# Patient Record
Sex: Female | Born: 1940
Health system: Southern US, Community
[De-identification: ages and names within clinical notes are randomized; demographics above are authoritative.]

## PROBLEM LIST (undated history)

## (undated) DIAGNOSIS — Z1621 Resistance to vancomycin: Secondary | ICD-10-CM

## (undated) DIAGNOSIS — F329 Major depressive disorder, single episode, unspecified: Secondary | ICD-10-CM

## (undated) DIAGNOSIS — Z933 Colostomy status: Secondary | ICD-10-CM

## (undated) DIAGNOSIS — K5720 Diverticulitis of large intestine with perforation and abscess without bleeding: Secondary | ICD-10-CM

## (undated) DIAGNOSIS — I10 Essential (primary) hypertension: Secondary | ICD-10-CM

## (undated) DIAGNOSIS — A491 Streptococcal infection, unspecified site: Secondary | ICD-10-CM

## (undated) HISTORY — DX: Major depressive disorder, single episode, unspecified: F32.9

## (undated) HISTORY — DX: Essential (primary) hypertension: I10

## (undated) HISTORY — DX: Streptococcal infection, unspecified site: A49.1

## (undated) HISTORY — DX: Diverticulitis of large intestine with perforation and abscess without bleeding: K57.20

## (undated) HISTORY — DX: Resistance to vancomycin: Z16.21

## (undated) HISTORY — DX: Colostomy status: Z93.3

---

## 1987-10-26 HISTORY — PX: ABDOMINAL HYSTERECTOMY: SUR658

## 1987-10-26 HISTORY — PX: TOTAL ABDOMINAL HYSTERECTOMY: SHX209

## 2004-12-22 ENCOUNTER — Ambulatory Visit: Payer: Self-pay

## 2005-12-28 ENCOUNTER — Ambulatory Visit: Payer: Self-pay

## 2007-01-03 ENCOUNTER — Ambulatory Visit: Payer: Self-pay

## 2007-07-10 ENCOUNTER — Ambulatory Visit: Payer: Self-pay | Admitting: Gastroenterology

## 2007-07-10 LAB — HM COLONOSCOPY

## 2007-10-09 ENCOUNTER — Ambulatory Visit: Payer: Self-pay | Admitting: Urology

## 2008-01-10 ENCOUNTER — Ambulatory Visit: Payer: Self-pay

## 2008-10-30 DIAGNOSIS — N281 Cyst of kidney, acquired: Secondary | ICD-10-CM | POA: Insufficient documentation

## 2008-10-30 DIAGNOSIS — M810 Age-related osteoporosis without current pathological fracture: Secondary | ICD-10-CM | POA: Insufficient documentation

## 2008-10-31 DIAGNOSIS — F411 Generalized anxiety disorder: Secondary | ICD-10-CM | POA: Insufficient documentation

## 2009-01-10 ENCOUNTER — Ambulatory Visit: Payer: Self-pay

## 2009-08-07 LAB — HM DEXA SCAN

## 2010-01-13 ENCOUNTER — Ambulatory Visit: Payer: Self-pay

## 2011-01-18 ENCOUNTER — Ambulatory Visit: Payer: Self-pay

## 2011-04-13 ENCOUNTER — Emergency Department: Payer: Self-pay | Admitting: Emergency Medicine

## 2012-02-02 ENCOUNTER — Ambulatory Visit: Payer: Self-pay

## 2012-08-01 LAB — LIPID PANEL
Cholesterol: 210 mg/dL — AB (ref 0–200)
HDL: 82 mg/dL — AB (ref 35–70)
LDL Cholesterol: 113 mg/dL
Triglycerides: 75 mg/dL (ref 40–160)

## 2012-08-17 ENCOUNTER — Ambulatory Visit: Payer: Self-pay | Admitting: Family Medicine

## 2012-08-25 HISTORY — PX: CATARACT EXTRACTION: SUR2

## 2013-02-21 ENCOUNTER — Ambulatory Visit: Payer: Self-pay

## 2013-09-11 LAB — HEPATIC FUNCTION PANEL
ALT: 10 U/L (ref 7–35)
AST: 16 U/L (ref 13–35)

## 2013-09-11 LAB — TSH: TSH: 2.11 u[IU]/mL (ref <=5.90)

## 2013-09-11 LAB — BASIC METABOLIC PANEL
BUN: 22 mg/dL — AB (ref 4–21)
GLUCOSE: 91 mg/dL
Potassium: 4.6 mmol/L (ref 3.4–5.3)
Sodium: 144 mmol/L (ref 137–147)

## 2013-09-11 LAB — CBC AND DIFFERENTIAL
HCT: 35 % — AB (ref 36–46)
Hemoglobin: 11.6 g/dL — AB (ref 12.0–16.0)
Platelets: 292 10*3/uL (ref 150–399)
WBC: 4.3 10^3/mL

## 2014-12-25 DIAGNOSIS — Z961 Presence of intraocular lens: Secondary | ICD-10-CM | POA: Diagnosis not present

## 2015-02-13 DIAGNOSIS — F329 Major depressive disorder, single episode, unspecified: Secondary | ICD-10-CM | POA: Insufficient documentation

## 2015-02-13 DIAGNOSIS — F32A Depression, unspecified: Secondary | ICD-10-CM | POA: Insufficient documentation

## 2015-02-13 DIAGNOSIS — E559 Vitamin D deficiency, unspecified: Secondary | ICD-10-CM | POA: Insufficient documentation

## 2015-02-13 DIAGNOSIS — M9979 Connective tissue and disc stenosis of intervertebral foramina of abdomen and other regions: Secondary | ICD-10-CM | POA: Insufficient documentation

## 2015-02-13 DIAGNOSIS — N951 Menopausal and female climacteric states: Secondary | ICD-10-CM | POA: Insufficient documentation

## 2015-02-13 DIAGNOSIS — M1990 Unspecified osteoarthritis, unspecified site: Secondary | ICD-10-CM | POA: Insufficient documentation

## 2015-02-13 DIAGNOSIS — E7800 Pure hypercholesterolemia, unspecified: Secondary | ICD-10-CM | POA: Insufficient documentation

## 2015-02-13 HISTORY — DX: Depression, unspecified: F32.A

## 2015-02-25 DIAGNOSIS — Z01419 Encounter for gynecological examination (general) (routine) without abnormal findings: Secondary | ICD-10-CM | POA: Diagnosis not present

## 2015-02-25 DIAGNOSIS — Z1231 Encounter for screening mammogram for malignant neoplasm of breast: Secondary | ICD-10-CM | POA: Diagnosis not present

## 2015-04-01 DIAGNOSIS — M9901 Segmental and somatic dysfunction of cervical region: Secondary | ICD-10-CM | POA: Diagnosis not present

## 2015-04-01 DIAGNOSIS — M5033 Other cervical disc degeneration, cervicothoracic region: Secondary | ICD-10-CM | POA: Diagnosis not present

## 2015-04-01 DIAGNOSIS — M5416 Radiculopathy, lumbar region: Secondary | ICD-10-CM | POA: Diagnosis not present

## 2015-04-01 DIAGNOSIS — M9903 Segmental and somatic dysfunction of lumbar region: Secondary | ICD-10-CM | POA: Diagnosis not present

## 2015-04-02 ENCOUNTER — Encounter: Payer: Self-pay | Admitting: Family Medicine

## 2015-04-02 ENCOUNTER — Ambulatory Visit (INDEPENDENT_AMBULATORY_CARE_PROVIDER_SITE_OTHER): Payer: Medicare PPO | Admitting: Family Medicine

## 2015-04-02 VITALS — BP 106/60 | HR 80 | Temp 97.2°F | Resp 16 | Ht 64.0 in | Wt 138.0 lb

## 2015-04-02 DIAGNOSIS — F329 Major depressive disorder, single episode, unspecified: Secondary | ICD-10-CM

## 2015-04-02 DIAGNOSIS — Z0000 Encounter for general adult medical examination without abnormal findings: Secondary | ICD-10-CM | POA: Diagnosis not present

## 2015-04-02 DIAGNOSIS — R413 Other amnesia: Secondary | ICD-10-CM | POA: Diagnosis not present

## 2015-04-02 DIAGNOSIS — M25561 Pain in right knee: Secondary | ICD-10-CM

## 2015-04-02 DIAGNOSIS — F32A Depression, unspecified: Secondary | ICD-10-CM

## 2015-04-02 MED ORDER — MELOXICAM 15 MG PO TABS: 15.0000 mg | ORAL_TABLET | Freq: Every day | ORAL | Status: DC

## 2015-04-02 MED ORDER — ESCITALOPRAM OXALATE 10 MG PO TABS: 10.0000 mg | ORAL_TABLET | Freq: Every day | ORAL | Status: DC

## 2015-04-02 NOTE — Progress Notes (Signed)
Patient ID: Katherine Wood, female   DOB: 07/31/1941, 74 y.o.   MRN: [id number] Patient: Katherine Wood, Female    DOB: 12/10/1940, 74 y.o.   MRN: [id number] Visit Date: 04/02/2015  Today's Provider: Lorie Phenix, MD   Chief Complaint  Patient presents with  . Medicare Wellness   Subjective:    Annual wellness visit Katherine Wood is a 74 y.o. female who presents today for her Subsequent Annual Wellness Visit. She feels well, pt is cc of right knee pain. She reports exercising daily (walks her dog). She reports she is sleeping fairly well. Patient reports that she sees Dr. Luella Cook at [facility], last cpe with him was on 02/2015. Patient reports that mammogram, pelvic and rectal exam were normal.   Past Medical History  Diagnosis Date  . Allergy   . Depression   . Hyperlipidemia   . Anxiety   . Osteoporosis    Past Surgical History  Procedure Laterality Date  . Cataract extraction Bilateral 08/2012    [facility]- Dr. Jettie Pagan  . Abdominal hysterectomy  1989    reports that she has never smoked. She has never used smokeless tobacco. She reports that she does not drink alcohol or use illicit drugs. family history includes Coronary artery disease in her brother; Heart attack in her brother; Heart disease in her father; Hypertension in her mother; Parkinsonism in her mother. No Known Allergies     Review of Systems  Constitutional: Negative.   HENT: Negative.   Eyes: Negative.   Respiratory: Positive for cough.   Gastrointestinal: Negative.   Endocrine: Negative.   Genitourinary: Negative.   Musculoskeletal: Positive for back pain.  Skin: Negative.   Allergic/Immunologic: Negative.   Neurological: Negative.   Hematological: Negative.   Psychiatric/Behavioral: Negative.      Her family history includes Coronary artery disease in her brother; Heart attack in her brother; Heart disease in her father; Hypertension in her mother; Parkinsonism in her  mother.      Patient Care Team: Lorie Phenix, MD as PCP - General (Family Medicine)     Objective:   Vitals: BP 106/60 mmHg  Pulse 80  Temp(Src) 97.2 F (36.2 C) (Oral)  Resp 16  Ht  (1.626 m)  Wt 138 lb (62.596 kg)  BMI 23.68 kg/m2  Physical Exam  Constitutional: She is oriented to person, place, and time. She appears well-developed and well-nourished.  HENT:  Head: Normocephalic and atraumatic.  Right Ear: Tympanic membrane, external ear and ear canal normal.  Left Ear: Tympanic membrane, external ear and ear canal normal.  Nose: Nose normal.  Mouth/Throat: Uvula is midline, oropharynx is clear and moist and mucous membranes are normal.  Eyes: Conjunctivae, EOM and lids are normal. Pupils are equal, round, and reactive to light.  Neck: Trachea normal and normal range of motion. Neck supple. Carotid bruit is not present. No thyroid mass and no thyromegaly present.  Cardiovascular: Normal rate, regular rhythm and normal heart sounds.   Pulmonary/Chest: Effort normal and breath sounds normal.  Abdominal: Soft. Normal appearance and bowel sounds are normal. There is no hepatosplenomegaly. There is no tenderness.  Musculoskeletal: Normal range of motion.  Lymphadenopathy:    She has no cervical adenopathy.    She has no axillary adenopathy.  Neurological: She is alert and oriented to person, place, and time. She has normal strength. No cranial nerve deficit.  Skin: Skin is warm, dry and intact.  Psychiatric: She has a normal mood and  affect. Her speech is normal and behavior is normal. Judgment and thought content normal. Cognition and memory are normal.    Activities of Daily Living In your present state of health, do you have any difficulty performing the following activities: 04/02/2015  Hearing? N  Vision? N  Difficulty concentrating or making decisions? N  Walking or climbing stairs? N  Dressing or bathing? N  Doing errands, shopping? N    Fall Risk  Assessment Fall Risk  04/02/2015  Falls in the past year? No     Depression Screen PHQ 2/9 Scores 04/02/2015  PHQ - 2 Score 0    Cognitive Testing - 6-CIT    Year: 0 4 points  Month: 0 3 points  Memorize "Floyde ParkinsJohn, [place], [address], [address], [place]"  Time (within 1 hour:) 0 3 points  Count backwards from 20: 0 2 4 points  Name months of year: 0 2 4 points  Repeat Address: 0 2 4 6 8 10  points   Total Score: 8/28  Interpretation : Normal (0-7) Abnormal (8-28)    Assessment & Plan:    1. Clinical depression    Worsening. Patient started on Lexapro 10 mg as below. F/U in 6 weeks. - escitalopram (LEXAPRO) 10 MG tablet; Take 1 tablet (10 mg total) by mouth daily. 1/2 tablet for 1 week then increase to 1 tablet daily  Dispense: 30 tablet; Refill: 1  2. Memory loss   Worsening. F/U in 6 weeks. - TSH - Vitamin B12  3. Right knee pain   Worsening. Patient restarted on Mobic 15 mg as below. Patient advised to call if symptoms are worsening or not improving. - meloxicam (MOBIC) 15 MG tablet; Take 1 tablet (15 mg total) by mouth daily.  Dispense: 30 tablet; Refill: 5  4. Medicare annual wellness visit, subsequent   Stable. Patient advised to continue healthy lifestyle.    Annual Wellness Visit  Reviewed patient's Family Medical History Reviewed and updated list of patient's medical providers Assessment of cognitive impairment was done Assessed patient's functional ability Established a written schedule for health screening services Health Risk Assessment Completed and Reviewed  Exercise Activities and Dietary recommendations Goals    . Exercise 150 minutes per week (moderate activity)    . Have 3 meals a day       Immunization History  Administered Date(s) Administered  . Pneumococcal Polysaccharide-23 07/27/2012  . Td 10/31/2008  . Tdap 09/06/2012  . Zoster 09/06/2012    Health Maintenance  Topic Date Due  . MAMMOGRAM  05/14/1992  . PNA vac Low Risk Adult (2 of 2 -  PCV13) 07/27/2013  . INFLUENZA VACCINE  05/26/2015  . COLONOSCOPY  07/09/2017  . TETANUS/TDAP  09/06/2022  . DEXA SCAN  Completed  . ZOSTAVAX  Completed      Discussed health benefits of physical activity, and encouraged her to engage in regular exercise appropriate for her age and condition.    ------------------------------------------------------------------------------------------------------------  Patient seen and examined by Dr. Leo GrosserNancy J.. Tazia Illescas, and note scribed by Liz BeachSulibeya S. Dimas, CMA.  I have reviewed the document for accuracy and completeness and I agree with above. Leo Grosser- Marita Burnsed J. Bridgette Wolden, MD

## 2015-04-03 ENCOUNTER — Telehealth: Payer: Self-pay

## 2015-04-03 LAB — VITAMIN B12: Vitamin B-12: 555 pg/mL (ref 211–946)

## 2015-04-03 LAB — TSH: TSH: 1.85 u[IU]/mL (ref 0.450–4.500)

## 2015-04-03 NOTE — Telephone Encounter (Signed)
LMTCB 04/03/2015  Thanks,   -Vernona Rieger

## 2015-04-03 NOTE — Telephone Encounter (Signed)
Pt advised as directed below.   Thanks,   -Keshan Reha  

## 2015-04-03 NOTE — Telephone Encounter (Signed)
-----   Message from Lorie Phenix, MD sent at 04/03/2015  6:10 AM EDT ----- Labs stable. Please notify patient. Thanks.

## 2015-04-29 ENCOUNTER — Other Ambulatory Visit: Payer: Self-pay | Admitting: Family Medicine

## 2015-04-29 DIAGNOSIS — F32A Depression, unspecified: Secondary | ICD-10-CM

## 2015-04-29 DIAGNOSIS — F329 Major depressive disorder, single episode, unspecified: Secondary | ICD-10-CM

## 2015-04-29 DIAGNOSIS — M5416 Radiculopathy, lumbar region: Secondary | ICD-10-CM | POA: Diagnosis not present

## 2015-04-29 DIAGNOSIS — M9903 Segmental and somatic dysfunction of lumbar region: Secondary | ICD-10-CM | POA: Diagnosis not present

## 2015-04-29 DIAGNOSIS — M9901 Segmental and somatic dysfunction of cervical region: Secondary | ICD-10-CM | POA: Diagnosis not present

## 2015-04-29 DIAGNOSIS — M5033 Other cervical disc degeneration, cervicothoracic region: Secondary | ICD-10-CM | POA: Diagnosis not present

## 2015-04-29 MED ORDER — ESCITALOPRAM OXALATE 10 MG PO TABS: 10.0000 mg | ORAL_TABLET | Freq: Every day | ORAL | Status: DC

## 2015-04-29 NOTE — Telephone Encounter (Signed)
escitalopram (LEXAPRO) 10 MG tablet Refill [facility] [address]

## 2015-05-13 DIAGNOSIS — M9901 Segmental and somatic dysfunction of cervical region: Secondary | ICD-10-CM | POA: Diagnosis not present

## 2015-05-13 DIAGNOSIS — M5033 Other cervical disc degeneration, cervicothoracic region: Secondary | ICD-10-CM | POA: Diagnosis not present

## 2015-05-13 DIAGNOSIS — M5416 Radiculopathy, lumbar region: Secondary | ICD-10-CM | POA: Diagnosis not present

## 2015-05-13 DIAGNOSIS — M9903 Segmental and somatic dysfunction of lumbar region: Secondary | ICD-10-CM | POA: Diagnosis not present

## 2015-05-16 ENCOUNTER — Ambulatory Visit (INDEPENDENT_AMBULATORY_CARE_PROVIDER_SITE_OTHER): Payer: Medicare PPO | Admitting: Family Medicine

## 2015-05-16 ENCOUNTER — Encounter: Payer: Self-pay | Admitting: Family Medicine

## 2015-05-16 VITALS — BP 122/70 | HR 72 | Temp 98.6°F | Resp 16 | Wt 136.6 lb

## 2015-05-16 DIAGNOSIS — F411 Generalized anxiety disorder: Secondary | ICD-10-CM

## 2015-05-16 DIAGNOSIS — R413 Other amnesia: Secondary | ICD-10-CM

## 2015-05-16 NOTE — Progress Notes (Signed)
Patient ID: Katherine Wood, female   DOB: [birth year], 74 y.o.   MRN: [id number]     Subjective:  HPI  Anxiety:  She was seen for this 6 weeks ago. Pt reports that she is doing well on Lexapro. She is not having any side effects that she knows of. She reports that she is about 95% better. She reports that the Lexapro has helped a little with her memory. She not worrying as much and is writing stuff down to try to remember it instead of getting frustrated with herself.  Has not helped as much with short term memory.  Husband is getting very frustrated with her.     Prior to Admission medications   Medication Sig Start Date End Date Taking? Authorizing Provider  calcium carbonate (OS-CAL) 600 MG TABS tablet Take 1 tablet by mouth daily. 08/31/12  Yes Historical Provider, MD  Cholecalciferol (VITAMIN D) 2000 UNITS CAPS Take 1 tablet by mouth daily.   Yes Historical Provider, MD  escitalopram (LEXAPRO) 10 MG tablet Take 1 tablet (10 mg total) by mouth daily. 04/29/15  Yes Lorie Phenix, MD  loratadine (CLARITIN) 10 MG tablet Take 1 tablet by mouth daily.   Yes Historical Provider, MD  meloxicam (MOBIC) 15 MG tablet Take 1 tablet (15 mg total) by mouth daily. 04/02/15  Yes Lorie Phenix, MD    Patient Active Problem List   Diagnosis Date Noted  . Memory loss 05/16/2015  . Arthritis 02/13/2015  . Narrowing of intervertebral disc space 02/13/2015  . Clinical depression 02/13/2015  . Hypercholesteremia 02/13/2015  . Amnesia 02/13/2015  . Symptomatic menopausal or female climacteric states 02/13/2015  . Arthritis, degenerative 02/13/2015  . Avitaminosis D 02/13/2015  . Bone/cartilage disorder 08/15/2009  . Anxiety state 10/31/2008  . Arthropathia 10/30/2008  . Leg varices 10/30/2008  . Acquired cyst of kidney 10/30/2008  . Cannot sleep 10/30/2008  . OP (osteoporosis) 10/30/2008  . Cystocele, midline 10/30/2008    Past Medical History  Diagnosis Date  . Allergy   . Depression   .  Hyperlipidemia   . Anxiety   . Osteoporosis     History   Social History  . Marital Status: Married    Spouse Name: N/A  . Number of Children: N/A  . Years of Education: N/A   Occupational History  . Not on file.   Social History Main Topics  . Smoking status: Never Smoker   . Smokeless tobacco: Never Used  . Alcohol Use: No  . Drug Use: No  . Sexual Activity: Not on file   Other Topics Concern  . Not on file   Social History Narrative    No Known Allergies  Review of Systems  Constitutional: Negative.   Eyes: Negative.   Respiratory: Negative.   Cardiovascular: Negative.   Gastrointestinal: Negative.   Genitourinary: Negative.   Musculoskeletal: Positive for joint pain.  Skin: Negative.   Neurological: Headaches: presently, but thinks it is from allergies.  Endo/Heme/Allergies: Negative.   Psychiatric/Behavioral: Negative.     Immunization History  Administered Date(s) Administered  . Pneumococcal Polysaccharide-23 07/27/2012  . Td 10/31/2008  . Tdap 09/06/2012  . Zoster 09/06/2012   Objective:  BP 122/70 mmHg  Pulse 72  Temp(Src) 98.6 F (37 C) (Oral)  Resp 16  Wt 136 lb 9.6 oz (61.961 kg)   Cognitive Testing - 6-CIT  Correct? Score   What year is it? yes 0 0 or 4  What month is it? yes 0 0 or 3  Memorize:    Floyde Parkins,  [address],  [address],  [place],      What time is it? (within 1 hour) yes 0 0 or 3  Count backwards from 20 yes 0 0, 2, or 4  Name the months of the year no 3 0, 2, or 4  Repeat name & address above no 9 0, 2, 4, 6, 8, or 10       TOTAL SCORE  12/28   Interpretation:  Abnormal  Normal (0-7) Abnormal (8-28)      Physical Exam  Constitutional: She is oriented to person, place, and time and well-developed, well-nourished, and in no distress.  Cardiovascular: Normal rate, regular rhythm, normal heart sounds and intact distal pulses.   Pulmonary/Chest: Effort normal and breath sounds normal.  Neurological: She is alert and  oriented to person, place, and time. She has normal reflexes. Gait normal. GCS score is 15.  Psychiatric: Mood, memory, affect and judgment normal.    Lab Results  Component Value Date   WBC 4.3 09/11/2013   HGB 11.6* 09/11/2013   HCT 35* 09/11/2013   PLT 292 09/11/2013   CHOL 210* 08/01/2012   TRIG 75 08/01/2012   HDL 82* 08/01/2012   LDLCALC 113 08/01/2012   TSH 1.850 04/02/2015    CMP     Component Value Date/Time   NA 144 09/11/2013   K 4.6 09/11/2013   BUN 22* 09/11/2013   AST 16 09/11/2013   ALT 10 09/11/2013    Assessment and Plan :  1. Anxiety state Improved on medication. Keep medication the same.  Please call back if condition worsens or does not continue to improve.     2. Memory loss Unchanged. Concerning for dementia. Will refer to neurology.  - Ambulatory referral to Neurology   Lorie Phenix, MD  [facility] 05/16/2015 2:25 PM

## 2015-06-17 DIAGNOSIS — M5033 Other cervical disc degeneration, cervicothoracic region: Secondary | ICD-10-CM | POA: Diagnosis not present

## 2015-06-17 DIAGNOSIS — M9901 Segmental and somatic dysfunction of cervical region: Secondary | ICD-10-CM | POA: Diagnosis not present

## 2015-06-17 DIAGNOSIS — M5416 Radiculopathy, lumbar region: Secondary | ICD-10-CM | POA: Diagnosis not present

## 2015-06-17 DIAGNOSIS — M9903 Segmental and somatic dysfunction of lumbar region: Secondary | ICD-10-CM | POA: Diagnosis not present

## 2015-07-02 DIAGNOSIS — G301 Alzheimer's disease with late onset: Secondary | ICD-10-CM | POA: Diagnosis not present

## 2015-07-02 DIAGNOSIS — F0280 Dementia in other diseases classified elsewhere without behavioral disturbance: Secondary | ICD-10-CM | POA: Diagnosis not present

## 2015-07-04 ENCOUNTER — Other Ambulatory Visit: Payer: Self-pay | Admitting: Neurology

## 2015-07-04 DIAGNOSIS — F0280 Dementia in other diseases classified elsewhere without behavioral disturbance: Secondary | ICD-10-CM

## 2015-07-04 DIAGNOSIS — G301 Alzheimer's disease with late onset: Principal | ICD-10-CM

## 2015-07-08 ENCOUNTER — Telehealth: Payer: Self-pay | Admitting: Family Medicine

## 2015-07-08 NOTE — Telephone Encounter (Signed)
Pt's daughter would like to speak to a nurse or Dr. Elease Hashimoto about concerns she has about pt not taking her medication. [name] would like to speak to someone before pt's appt tomorrow at 10:30. [name] thinks pt is confused about her medications. [name] said that pt is skipping doses of the  escitalopram (LEXAPRO) 10 MG tablet. [name] said that pt told her that Lexapro is what is causing the memory loss. [name] continued to say that she is concerned about her mom's health and wants to update Dr. Elease Hashimoto with what is going on because she thinks that her parents will down play to  how pt's health really is. [name] asked that we not mention that she called to her parents b/c her dad would get upset but she feels Dr. Elease Hashimoto needs to be updated. Thanks TNP

## 2015-07-08 NOTE — Telephone Encounter (Signed)
Spoke with daughter.  Was started on Alzheimer's medication. Could not take it. Is scheduled for Meloxicam. Thinks she is confused about the Lexapro.  Not sure if she is taking it.  Stopping and starting the medication. Was confused about the medication.  Can not come to the appointment tomorrow. Did go to the neurology appointment.  Is going to appointment for headache. Forgets why she is coming.  Do not want her to stop and start taking her medication.  Will talk about the medication. Will need to fill out the form. Thanks.

## 2015-07-09 ENCOUNTER — Encounter: Payer: Self-pay | Admitting: Family Medicine

## 2015-07-09 ENCOUNTER — Ambulatory Visit (INDEPENDENT_AMBULATORY_CARE_PROVIDER_SITE_OTHER): Payer: Medicare PPO | Admitting: Family Medicine

## 2015-07-09 VITALS — BP 116/62 | HR 65 | Temp 98.2°F | Resp 16 | Wt 140.0 lb

## 2015-07-09 DIAGNOSIS — R51 Headache: Secondary | ICD-10-CM | POA: Diagnosis not present

## 2015-07-09 DIAGNOSIS — R413 Other amnesia: Secondary | ICD-10-CM

## 2015-07-09 DIAGNOSIS — R519 Headache, unspecified: Secondary | ICD-10-CM

## 2015-07-09 DIAGNOSIS — F329 Major depressive disorder, single episode, unspecified: Secondary | ICD-10-CM

## 2015-07-09 DIAGNOSIS — F32A Depression, unspecified: Secondary | ICD-10-CM

## 2015-07-09 MED ORDER — ACETAMINOPHEN 500 MG PO CAPS: 1.0000 | ORAL_CAPSULE | ORAL | Status: DC | PRN

## 2015-07-09 MED ORDER — ESCITALOPRAM OXALATE 10 MG PO TABS: 5.0000 mg | ORAL_TABLET | Freq: Every day | ORAL | Status: DC

## 2015-07-09 NOTE — Progress Notes (Signed)
Patient ID: Katherine Wood, female   DOB: November 16, 1940, 74 y.o.   MRN: [id number]        Patient: Katherine Wood Female    DOB: 09/11/1941   74 y.o.   MRN: [id number] Visit Date: 07/09/2015  Today's Provider: Lorie Phenix, MD   Chief Complaint  Patient presents with  . Memory Loss  . Headache   Subjective:    HPI  Dementia: She is here for evaluation and treatment of cognitive problems. She is accompanied by patient and husband. Primary caregiver is patient and husband. The family and the patient identify problems with changes in short term memory. Family and patient report problems with none. . Family and patient are concerned about  headaches, however, they are not concerned about medication errors. Medication administration: patient self medicates   Functional Assessment:  Activities of Daily Living (ADLs):   She is independent in the following: ambulation Requires assistance with the following: nothing.  Instrumental Activities of Daily Living (IADLs):   She is independent in the following: All ADLs. Requires assistance with the following: none.  Patient reports that she is taking her Lexapro daily.  No missed days. Was taking in am, and now taking at ngiht.   Follow up for headache  The patient was last seen for this 2 months ago. Changes made at last visit include referred to neurrology.  She reports good compliance with treatment. She feels that condition is Unchanged. Patient reports that she is taking Ibuprofen 3 times a day. Patient is scheduled for an MRI tomorrow.   Here with her husband who actively involved with her medication and management.    ------------------------------------------------------------------------------------     No Known Allergies Previous Medications   CALCIUM CARBONATE (OS-CAL) 600 MG TABS TABLET    Take 1 tablet by mouth daily.   CHOLECALCIFEROL (VITAMIN D) 2000 UNITS CAPS    Take 1 tablet by mouth daily.   ESCITALOPRAM  (LEXAPRO) 10 MG TABLET    Take 1 tablet (10 mg total) by mouth daily.   GALANTAMINE (RAZADYNE) 4 MG TABLET    Take 1 tablet by mouth 2 (two) times daily.   LORATADINE (CLARITIN) 10 MG TABLET    Take 1 tablet by mouth daily as needed.    MELOXICAM (MOBIC) 15 MG TABLET    Take 1 tablet (15 mg total) by mouth daily.    Review of Systems  Social History  Substance Use Topics  . Smoking status: Never Smoker   . Smokeless tobacco: Never Used  . Alcohol Use: No   Objective:   BP 116/62 mmHg  Pulse 65  Temp(Src) 98.2 F (36.8 C) (Oral)  Resp 16  Wt 140 lb (63.504 kg)  SpO2 97%  Physical Exam  Constitutional: She is oriented to person, place, and time. She appears well-developed and well-nourished.  HENT:  Head: Normocephalic and atraumatic.  Right Ear: External ear normal.  Left Ear: External ear normal.  Mouth/Throat: Oropharynx is clear and moist.  Eyes: Conjunctivae and EOM are normal. Pupils are equal, round, and reactive to light.  Neck: Normal range of motion. Neck supple.  Cardiovascular: Normal rate and regular rhythm.   Pulmonary/Chest: Effort normal and breath sounds normal.  Neurological: She is alert and oriented to person, place, and time.  Psychiatric: She has a normal mood and affect. Her behavior is normal. Judgment and thought content normal.       Assessment & Plan:     1. Memory loss Recommend follow up with  neurologist.  Consider Generic Aricept.   Continue Lexapro at lower dose.  - escitalopram (LEXAPRO) 10 MG tablet; Take 0.5 tablets (5 mg total) by mouth daily.  Dispense: 30 tablet; Refill: 5  2. Clinical depression Stable. Decrease medication and monitor. Increase if symptoms return.  - escitalopram (LEXAPRO) 10 MG tablet; Take 0.5 tablets (5 mg total) by mouth daily.  Dispense: 30 tablet; Refill: 5  3. Nonintractable headache, unspecified chronicity pattern, unspecified headache type Will check sed rate, MRI tomorrow.  Trial of tylenol for pain.  -  Acetaminophen 500 MG coapsule; Take 1 capsule (500 mg total) by mouth every 4 (four) hours as needed for fever.  Dispense: 1 capsule; Refill: 0 - Sedimentation rate      Patient was seen and examined by Leo Grosser, MD, and note scribed by Rondel Baton, CMA.    I have reviewed the document for accuracy and completeness and I agree with above. - Leo Grosser, MD   Lorie Phenix, MD  [facility]

## 2015-07-10 ENCOUNTER — Ambulatory Visit
Admission: RE | Admit: 2015-07-10 | Discharge: 2015-07-10 | Disposition: A | Discharge status: Home / Self Care | Payer: Medicare PPO | Source: Ambulatory Visit | Attending: Neurology | Admitting: Neurology

## 2015-07-10 ENCOUNTER — Telehealth: Payer: Self-pay

## 2015-07-10 DIAGNOSIS — F0280 Dementia in other diseases classified elsewhere without behavioral disturbance: Secondary | ICD-10-CM | POA: Diagnosis not present

## 2015-07-10 DIAGNOSIS — G301 Alzheimer's disease with late onset: Secondary | ICD-10-CM | POA: Diagnosis not present

## 2015-07-10 LAB — SEDIMENTATION RATE: SED RATE: 5 mm/h (ref 0–40)

## 2015-07-10 NOTE — Telephone Encounter (Signed)
-----   Message from Lorie Phenix, MD sent at 07/10/2015 11:10 AM EDT ----- Negative for inflammation. Thanks.

## 2015-07-10 NOTE — Telephone Encounter (Signed)
LMTCB 07/10/2015  Thanks,   -Laura  

## 2015-07-10 NOTE — Telephone Encounter (Signed)
Advised pt as directed. Pt verbalized fully understanding.  Thanks,   

## 2015-07-15 DIAGNOSIS — M5033 Other cervical disc degeneration, cervicothoracic region: Secondary | ICD-10-CM | POA: Diagnosis not present

## 2015-07-15 DIAGNOSIS — M9901 Segmental and somatic dysfunction of cervical region: Secondary | ICD-10-CM | POA: Diagnosis not present

## 2015-07-15 DIAGNOSIS — M9903 Segmental and somatic dysfunction of lumbar region: Secondary | ICD-10-CM | POA: Diagnosis not present

## 2015-07-15 DIAGNOSIS — M5416 Radiculopathy, lumbar region: Secondary | ICD-10-CM | POA: Diagnosis not present

## 2015-07-24 DIAGNOSIS — G301 Alzheimer's disease with late onset: Secondary | ICD-10-CM | POA: Diagnosis not present

## 2015-07-24 DIAGNOSIS — F0280 Dementia in other diseases classified elsewhere without behavioral disturbance: Secondary | ICD-10-CM | POA: Diagnosis not present

## 2015-07-24 DIAGNOSIS — F320 Major depressive disorder, single episode, mild: Secondary | ICD-10-CM | POA: Diagnosis not present

## 2015-07-27 DIAGNOSIS — F0280 Dementia in other diseases classified elsewhere without behavioral disturbance: Secondary | ICD-10-CM | POA: Insufficient documentation

## 2015-07-27 DIAGNOSIS — G309 Alzheimer's disease, unspecified: Secondary | ICD-10-CM

## 2015-08-12 DIAGNOSIS — M9901 Segmental and somatic dysfunction of cervical region: Secondary | ICD-10-CM | POA: Diagnosis not present

## 2015-08-12 DIAGNOSIS — M5033 Other cervical disc degeneration, cervicothoracic region: Secondary | ICD-10-CM | POA: Diagnosis not present

## 2015-08-12 DIAGNOSIS — M9903 Segmental and somatic dysfunction of lumbar region: Secondary | ICD-10-CM | POA: Diagnosis not present

## 2015-08-12 DIAGNOSIS — M5416 Radiculopathy, lumbar region: Secondary | ICD-10-CM | POA: Diagnosis not present

## 2015-09-09 DIAGNOSIS — M9901 Segmental and somatic dysfunction of cervical region: Secondary | ICD-10-CM | POA: Diagnosis not present

## 2015-09-09 DIAGNOSIS — M5033 Other cervical disc degeneration, cervicothoracic region: Secondary | ICD-10-CM | POA: Diagnosis not present

## 2015-09-09 DIAGNOSIS — M9903 Segmental and somatic dysfunction of lumbar region: Secondary | ICD-10-CM | POA: Diagnosis not present

## 2015-09-09 DIAGNOSIS — M5416 Radiculopathy, lumbar region: Secondary | ICD-10-CM | POA: Diagnosis not present

## 2015-10-07 DIAGNOSIS — M9903 Segmental and somatic dysfunction of lumbar region: Secondary | ICD-10-CM | POA: Diagnosis not present

## 2015-10-07 DIAGNOSIS — M5416 Radiculopathy, lumbar region: Secondary | ICD-10-CM | POA: Diagnosis not present

## 2015-10-07 DIAGNOSIS — M5033 Other cervical disc degeneration, cervicothoracic region: Secondary | ICD-10-CM | POA: Diagnosis not present

## 2015-10-07 DIAGNOSIS — M9901 Segmental and somatic dysfunction of cervical region: Secondary | ICD-10-CM | POA: Diagnosis not present

## 2016-01-12 ENCOUNTER — Encounter: Payer: Self-pay | Admitting: Family Medicine

## 2016-01-12 ENCOUNTER — Ambulatory Visit (INDEPENDENT_AMBULATORY_CARE_PROVIDER_SITE_OTHER): Payer: Medicare Other | Admitting: Family Medicine

## 2016-01-12 VITALS — BP 124/62 | HR 80 | Temp 97.7°F | Resp 16 | Wt 127.0 lb

## 2016-01-12 DIAGNOSIS — F411 Generalized anxiety disorder: Secondary | ICD-10-CM

## 2016-01-12 DIAGNOSIS — G301 Alzheimer's disease with late onset: Secondary | ICD-10-CM | POA: Diagnosis not present

## 2016-01-12 DIAGNOSIS — F0280 Dementia in other diseases classified elsewhere without behavioral disturbance: Secondary | ICD-10-CM

## 2016-01-12 NOTE — Progress Notes (Signed)
Subjective:    Patient ID: Katherine Wood, female    DOB: May 21, 1941, 75 y.o.   MRN: [id number]  Anxiety Presents for follow-up visit. The problem has been gradually improving. Symptoms include confusion and dizziness (occasionally). Patient reports no chest pain, compulsions, decreased concentration, depressed mood, dry mouth, excessive worry, feeling of choking, hyperventilation, insomnia, irritability, malaise, muscle tension, nausea, nervous/anxious behavior, obsessions, palpitations, panic, restlessness, shortness of breath or suicidal ideas. The severity of symptoms is mild.   Treatments tried: was on Lexapro. Is no longer taking the medication.  Pt and husband agree that pt does not need the Lexapro any longer.  Memory Loss Pt is seeing Dr. Sherryll Burger for this problem. Pt is taking Aricept 10 mg for this problem, without any compliance problems. Pt reports this problem is unchanged.   Review of Systems  Constitutional: Negative for irritability.  Respiratory: Negative for shortness of breath.   Cardiovascular: Negative for chest pain and palpitations.  Gastrointestinal: Negative for nausea.  Musculoskeletal: Positive for myalgias (leg pain).  Neurological: Positive for dizziness (occasionally).  Psychiatric/Behavioral: Positive for confusion. Negative for suicidal ideas and decreased concentration. The patient is not nervous/anxious and does not have insomnia.    BP 124/62 mmHg  Pulse 80  Temp(Src) 97.7 F (36.5 C) (Oral)  Resp 16  Wt 127 lb (57.607 kg)   Patient Active Problem List   Diagnosis Date Noted  . Dementia in Alzheimer's disease with late onset 07/27/2015  . Memory loss 05/16/2015  . Arthritis 02/13/2015  . Narrowing of intervertebral disc space 02/13/2015  . Clinical depression 02/13/2015  . Hypercholesteremia 02/13/2015  . Amnesia 02/13/2015  . Symptomatic menopausal or female climacteric states 02/13/2015  . Arthritis, degenerative 02/13/2015  .  Avitaminosis D 02/13/2015  . Connective tissue and disc stenosis of intervertebral foramina of abdomen and other regions 02/13/2015  . Bone/cartilage disorder 08/15/2009  . Anxiety state 10/31/2008  . Arthropathia 10/30/2008  . Leg varices 10/30/2008  . Acquired cyst of kidney 10/30/2008  . Cannot sleep 10/30/2008  . OP (osteoporosis) 10/30/2008  . Cystocele, midline 10/30/2008  . Involutional osteoporosis 10/30/2008   Past Medical History  Diagnosis Date  . Allergy   . Depression   . Hyperlipidemia   . Anxiety   . Osteoporosis    Current Outpatient Prescriptions on File Prior to Visit  Medication Sig  . Acetaminophen 500 MG coapsule Take 1 capsule (500 mg total) by mouth every 4 (four) hours as needed for fever.  . calcium carbonate (OS-CAL) 600 MG TABS tablet Take 1 tablet by mouth daily.  . Cholecalciferol (VITAMIN D) 2000 UNITS CAPS Take 1 tablet by mouth daily.  Marland Kitchen loratadine (CLARITIN) 10 MG tablet Take 1 tablet by mouth daily as needed.   . meloxicam (MOBIC) 15 MG tablet Take 1 tablet (15 mg total) by mouth daily. (Patient taking differently: Take 15 mg by mouth. )  . escitalopram (LEXAPRO) 10 MG tablet Take 0.5 tablets (5 mg total) by mouth daily. (Patient not taking: Reported on 01/12/2016)   No current facility-administered medications on file prior to visit.   Allergies  Allergen Reactions  . Galantamine Other (See Comments)   Past Surgical History  Procedure Laterality Date  . Cataract extraction Bilateral 08/2012    [facility]- Dr. Jettie Pagan  . Abdominal hysterectomy  1989   Social History   Social History  . Marital Status: Married    Spouse Name: Renae Fickle  . Number of Children: 2  . Years of  Education: N/A   Occupational History  . Not on file.   Social History Main Topics  . Smoking status: Never Smoker   . Smokeless tobacco: Never Used  . Alcohol Use: No  . Drug Use: No  . Sexual Activity: Not on file   Other Topics Concern  . Not on  file   Social History Narrative   Family History  Problem Relation Age of Onset  . Hypertension Mother   . Parkinsonism Mother   . Heart disease Father   . Coronary artery disease Brother   . Heart attack Brother        Objective:   Physical Exam  Constitutional: She appears well-developed and well-nourished.  Psychiatric: She has a normal mood and affect. Her behavior is normal.          Assessment & Plan:  1. Dementia in Alzheimer's disease with late onset Stable. Continue current medications and plan of care. Will FU with neurology as scheduled.  2. Anxiety state Stable. No longer needs Lexapro for this. Follow up as needed.  Follow up for Wellness in June.   Patient seen and examined by Leo GrosserNancy J. Carnell Beavers, MD, and note scribed by Allene DillonEmily Drozdowski, CMA.   I have reviewed the document for accuracy and completeness and I agree with above. Leo Grosser- Jackelyne Sayer J. Dana Debo, MD   Lorie PhenixNancy Kalyssa Anker, MD

## 2016-01-18 ENCOUNTER — Encounter
Admission: EM | Disposition: A | Discharge status: Skilled Nursing Facility | Payer: Self-pay | Source: Home / Self Care | Attending: Surgery

## 2016-01-18 ENCOUNTER — Emergency Department: Payer: Medicare Other

## 2016-01-18 ENCOUNTER — Emergency Department: Payer: Medicare Other | Admitting: Anesthesiology

## 2016-01-18 ENCOUNTER — Encounter: Payer: Self-pay | Admitting: Medical Oncology

## 2016-01-18 ENCOUNTER — Inpatient Hospital Stay
Admission: EM | Admit: 2016-01-18 | Discharge: 2016-02-14 | DRG: 329 | Disposition: A | Discharge status: Skilled Nursing Facility | Payer: Medicare Other | Attending: Surgery | Admitting: Surgery

## 2016-01-18 DIAGNOSIS — K429 Umbilical hernia without obstruction or gangrene: Secondary | ICD-10-CM | POA: Diagnosis present

## 2016-01-18 DIAGNOSIS — Z4659 Encounter for fitting and adjustment of other gastrointestinal appliance and device: Secondary | ICD-10-CM

## 2016-01-18 DIAGNOSIS — K660 Peritoneal adhesions (postprocedural) (postinfection): Secondary | ICD-10-CM | POA: Diagnosis present

## 2016-01-18 DIAGNOSIS — Z1621 Resistance to vancomycin: Secondary | ICD-10-CM | POA: Diagnosis present

## 2016-01-18 DIAGNOSIS — K651 Peritoneal abscess: Secondary | ICD-10-CM | POA: Insufficient documentation

## 2016-01-18 DIAGNOSIS — E43 Unspecified severe protein-calorie malnutrition: Secondary | ICD-10-CM | POA: Insufficient documentation

## 2016-01-18 DIAGNOSIS — K668 Other specified disorders of peritoneum: Secondary | ICD-10-CM | POA: Diagnosis not present

## 2016-01-18 DIAGNOSIS — K469 Unspecified abdominal hernia without obstruction or gangrene: Secondary | ICD-10-CM | POA: Insufficient documentation

## 2016-01-18 DIAGNOSIS — G301 Alzheimer's disease with late onset: Secondary | ICD-10-CM | POA: Diagnosis present

## 2016-01-18 DIAGNOSIS — E860 Dehydration: Secondary | ICD-10-CM | POA: Diagnosis not present

## 2016-01-18 DIAGNOSIS — K5720 Diverticulitis of large intestine with perforation and abscess without bleeding: Secondary | ICD-10-CM | POA: Diagnosis not present

## 2016-01-18 DIAGNOSIS — Z5331 Laparoscopic surgical procedure converted to open procedure: Secondary | ICD-10-CM

## 2016-01-18 DIAGNOSIS — F0390 Unspecified dementia without behavioral disturbance: Secondary | ICD-10-CM | POA: Diagnosis present

## 2016-01-18 DIAGNOSIS — R1084 Generalized abdominal pain: Secondary | ICD-10-CM

## 2016-01-18 DIAGNOSIS — Z79899 Other long term (current) drug therapy: Secondary | ICD-10-CM

## 2016-01-18 DIAGNOSIS — B965 Pseudomonas (aeruginosa) (mallei) (pseudomallei) as the cause of diseases classified elsewhere: Secondary | ICD-10-CM | POA: Diagnosis present

## 2016-01-18 DIAGNOSIS — I471 Supraventricular tachycardia: Secondary | ICD-10-CM | POA: Diagnosis not present

## 2016-01-18 DIAGNOSIS — B9689 Other specified bacterial agents as the cause of diseases classified elsewhere: Secondary | ICD-10-CM | POA: Diagnosis present

## 2016-01-18 DIAGNOSIS — Z791 Long term (current) use of non-steroidal anti-inflammatories (NSAID): Secondary | ICD-10-CM

## 2016-01-18 DIAGNOSIS — E876 Hypokalemia: Secondary | ICD-10-CM | POA: Diagnosis present

## 2016-01-18 DIAGNOSIS — Z8249 Family history of ischemic heart disease and other diseases of the circulatory system: Secondary | ICD-10-CM

## 2016-01-18 DIAGNOSIS — T8149XA Infection following a procedure, other surgical site, initial encounter: Secondary | ICD-10-CM

## 2016-01-18 DIAGNOSIS — B952 Enterococcus as the cause of diseases classified elsewhere: Secondary | ICD-10-CM | POA: Diagnosis present

## 2016-01-18 DIAGNOSIS — T8143XA Infection following a procedure, organ and space surgical site, initial encounter: Secondary | ICD-10-CM

## 2016-01-18 DIAGNOSIS — E785 Hyperlipidemia, unspecified: Secondary | ICD-10-CM | POA: Diagnosis present

## 2016-01-18 DIAGNOSIS — D638 Anemia in other chronic diseases classified elsewhere: Secondary | ICD-10-CM | POA: Diagnosis present

## 2016-01-18 DIAGNOSIS — I10 Essential (primary) hypertension: Secondary | ICD-10-CM | POA: Diagnosis present

## 2016-01-18 DIAGNOSIS — K631 Perforation of intestine (nontraumatic): Secondary | ICD-10-CM | POA: Insufficient documentation

## 2016-01-18 DIAGNOSIS — Z681 Body mass index (BMI) 19 or less, adult: Secondary | ICD-10-CM

## 2016-01-18 DIAGNOSIS — Z978 Presence of other specified devices: Secondary | ICD-10-CM

## 2016-01-18 DIAGNOSIS — Z820 Family history of epilepsy and other diseases of the nervous system: Secondary | ICD-10-CM

## 2016-01-18 DIAGNOSIS — F0280 Dementia in other diseases classified elsewhere without behavioral disturbance: Secondary | ICD-10-CM | POA: Diagnosis present

## 2016-01-18 HISTORY — PX: LAPAROTOMY: SHX154

## 2016-01-18 LAB — CBC
HEMATOCRIT: 30.4 % — AB (ref 35.0–47.0)
HEMOGLOBIN: 10.4 g/dL — AB (ref 12.0–16.0)
MCH: 32.1 pg (ref 26.0–34.0)
MCHC: 34.3 g/dL (ref 32.0–36.0)
MCV: 93.6 fL (ref 80.0–100.0)
Platelets: 303 10*3/uL (ref 150–440)
RBC: 3.24 MIL/uL — AB (ref 3.80–5.20)
RDW: 14.2 % (ref 11.5–14.5)
WBC: 9.1 10*3/uL (ref 3.6–11.0)

## 2016-01-18 LAB — COMPREHENSIVE METABOLIC PANEL
ALT: 14 U/L (ref 14–54)
AST: 18 U/L (ref 15–41)
Albumin: 3.2 g/dL — ABNORMAL LOW (ref 3.5–5.0)
Alkaline Phosphatase: 65 U/L (ref 38–126)
Anion gap: 12 (ref 5–15)
BUN: 36 mg/dL — AB (ref 6–20)
CHLORIDE: 101 mmol/L (ref 101–111)
CO2: 20 mmol/L — AB (ref 22–32)
CREATININE: 1.45 mg/dL — AB (ref 0.44–1.00)
Calcium: 8.7 mg/dL — ABNORMAL LOW (ref 8.9–10.3)
GFR calc Af Amer: 40 mL/min — ABNORMAL LOW (ref >60)
GFR, EST NON AFRICAN AMERICAN: 35 mL/min — AB (ref >60)
Glucose, Bld: 110 mg/dL — ABNORMAL HIGH (ref 65–99)
Potassium: 3.2 mmol/L — ABNORMAL LOW (ref 3.5–5.1)
SODIUM: 133 mmol/L — AB (ref 135–145)
TOTAL PROTEIN: 7.3 g/dL (ref 6.5–8.1)
Total Bilirubin: 1 mg/dL (ref 0.3–1.2)

## 2016-01-18 LAB — URINALYSIS COMPLETE WITH MICROSCOPIC (ARMC ONLY)
BACTERIA UA: NONE SEEN
Bilirubin Urine: NEGATIVE
Glucose, UA: NEGATIVE mg/dL
LEUKOCYTES UA: NEGATIVE
NITRITE: NEGATIVE
PH: 5 (ref 5.0–8.0)
Protein, ur: NEGATIVE mg/dL
SPECIFIC GRAVITY, URINE: 1.015 (ref 1.005–1.030)
SQUAMOUS EPITHELIAL / LPF: NONE SEEN

## 2016-01-18 LAB — PROTIME-INR
INR: 1.37
PROTHROMBIN TIME: 17 s — AB (ref 11.4–15.0)

## 2016-01-18 LAB — LACTIC ACID, PLASMA: LACTIC ACID, VENOUS: 1.2 mmol/L (ref 0.5–2.0)

## 2016-01-18 LAB — PREPARE RBC (CROSSMATCH)

## 2016-01-18 LAB — ABO/RH: ABO/RH(D): A POS

## 2016-01-18 LAB — APTT: APTT: 41 s — AB (ref 24–36)

## 2016-01-18 LAB — LIPASE, BLOOD: LIPASE: 11 U/L (ref 11–51)

## 2016-01-18 SURGERY — LAPAROTOMY, EXPLORATORY
Anesthesia: General | Wound class: Dirty or Infected

## 2016-01-18 MED ORDER — METHYLENE BLUE 0.5 % INJ SOLN
INTRAVENOUS | Status: AC
  Filled 2016-01-18: qty 10

## 2016-01-18 MED ORDER — ONDANSETRON HCL 4 MG/2ML IJ SOLN
INTRAMUSCULAR | Status: DC | PRN
  Administered 2016-01-18: 4 mg via INTRAVENOUS

## 2016-01-18 MED ORDER — PIPERACILLIN-TAZOBACTAM 3.375 G IVPB
INTRAVENOUS | Status: AC
Start: 2016-01-18 — End: 2016-01-19
  Filled 2016-01-18: qty 50

## 2016-01-18 MED ORDER — LIDOCAINE HCL (CARDIAC) 20 MG/ML IV SOLN
INTRAVENOUS | Status: DC | PRN
  Administered 2016-01-18: 50 mg via INTRAVENOUS

## 2016-01-18 MED ORDER — METRONIDAZOLE IN NACL 5-0.79 MG/ML-% IV SOLN
INTRAVENOUS | Status: DC | PRN
  Administered 2016-01-18: 500 mg via INTRAVENOUS

## 2016-01-18 MED ORDER — CEFAZOLIN SODIUM-DEXTROSE 2-3 GM-% IV SOLR
INTRAVENOUS | Status: DC | PRN
  Administered 2016-01-18: 2 g via INTRAVENOUS

## 2016-01-18 MED ORDER — MIDAZOLAM HCL 2 MG/2ML IJ SOLN
INTRAMUSCULAR | Status: DC | PRN
  Administered 2016-01-18: 2 mg via INTRAVENOUS

## 2016-01-18 MED ORDER — ONDANSETRON HCL 4 MG/2ML IJ SOLN
4.0000 mg | Freq: Once | INTRAMUSCULAR | Status: AC
  Administered 2016-01-18: 4 mg via INTRAVENOUS
  Filled 2016-01-18: qty 2

## 2016-01-18 MED ORDER — NEOSTIGMINE METHYLSULFATE 10 MG/10ML IV SOLN
INTRAVENOUS | Status: DC | PRN
  Administered 2016-01-18: 4 mg via INTRAVENOUS

## 2016-01-18 MED ORDER — PANTOPRAZOLE SODIUM 40 MG IV SOLR
INTRAVENOUS | Status: DC | PRN
  Administered 2016-01-18: 80 mg via INTRAVENOUS

## 2016-01-18 MED ORDER — CIPROFLOXACIN IN D5W 400 MG/200ML IV SOLN
400.0000 mg | INTRAVENOUS | Status: DC
  Filled 2016-01-18: qty 200

## 2016-01-18 MED ORDER — SUCCINYLCHOLINE CHLORIDE 20 MG/ML IJ SOLN
INTRAMUSCULAR | Status: DC | PRN
  Administered 2016-01-18: 80 mg via INTRAVENOUS

## 2016-01-18 MED ORDER — GLYCOPYRROLATE 0.2 MG/ML IJ SOLN
INTRAMUSCULAR | Status: DC | PRN
  Administered 2016-01-18: 0.6 mg via INTRAVENOUS

## 2016-01-18 MED ORDER — PIPERACILLIN-TAZOBACTAM 3.375 G IVPB
3.3750 g | INTRAVENOUS | Status: AC
  Administered 2016-01-18: 3.375 g via INTRAVENOUS

## 2016-01-18 MED ORDER — SODIUM CHLORIDE 0.9 % IJ SOLN
INTRAMUSCULAR | Status: AC
  Filled 2016-01-18: qty 50

## 2016-01-18 MED ORDER — MORPHINE SULFATE (PF) 2 MG/ML IV SOLN
2.0000 mg | Freq: Once | INTRAVENOUS | Status: AC
  Administered 2016-01-18: 2 mg via INTRAVENOUS
  Filled 2016-01-18: qty 1

## 2016-01-18 MED ORDER — PHENYLEPHRINE HCL 10 MG/ML IJ SOLN
INTRAMUSCULAR | Status: DC | PRN
  Administered 2016-01-18: 100 ug via INTRAVENOUS
  Administered 2016-01-18: 50 ug via INTRAVENOUS

## 2016-01-18 MED ORDER — SODIUM CHLORIDE 0.9 % IV BOLUS (SEPSIS)
1000.0000 mL | INTRAVENOUS | Status: AC
  Administered 2016-01-18: 1000 mL via INTRAVENOUS

## 2016-01-18 MED ORDER — SODIUM CHLORIDE 0.9 % IV SOLN
80.0000 mg | Freq: Once | INTRAVENOUS | Status: AC
  Administered 2016-01-18: 80 mg via INTRAVENOUS
  Filled 2016-01-18: qty 80

## 2016-01-18 MED ORDER — SODIUM CHLORIDE 0.9 % IV SOLN: Freq: Once | INTRAVENOUS | Status: DC

## 2016-01-18 MED ORDER — ROCURONIUM BROMIDE 100 MG/10ML IV SOLN
INTRAVENOUS | Status: DC | PRN
  Administered 2016-01-18: 30 mg via INTRAVENOUS

## 2016-01-18 MED ORDER — METRONIDAZOLE IN NACL 5-0.79 MG/ML-% IV SOLN
500.0000 mg | INTRAVENOUS | Status: DC
  Filled 2016-01-18: qty 100

## 2016-01-18 MED ORDER — IOPAMIDOL (ISOVUE-300) INJECTION 61%
75.0000 mL | Freq: Once | INTRAVENOUS | Status: AC | PRN
  Administered 2016-01-18: 75 mL via INTRAVENOUS

## 2016-01-18 MED ORDER — DIATRIZOATE MEGLUMINE & SODIUM 66-10 % PO SOLN
15.0000 mL | ORAL | Status: AC
  Administered 2016-01-18 (×2): 15 mL via ORAL

## 2016-01-18 MED ORDER — 0.9 % SODIUM CHLORIDE (POUR BTL) OPTIME
TOPICAL | Status: DC | PRN
  Administered 2016-01-18: 4000 mL

## 2016-01-18 MED ORDER — BUPIVACAINE-EPINEPHRINE (PF) 0.25% -1:200000 IJ SOLN
INTRAMUSCULAR | Status: AC
  Filled 2016-01-18: qty 30

## 2016-01-18 MED ORDER — BUPIVACAINE-EPINEPHRINE (PF) 0.25% -1:200000 IJ SOLN
INTRAMUSCULAR | Status: DC | PRN
  Administered 2016-01-18: 30 mL

## 2016-01-18 MED ORDER — PROPOFOL 10 MG/ML IV BOLUS
INTRAVENOUS | Status: DC | PRN
  Administered 2016-01-18: 120 mg via INTRAVENOUS

## 2016-01-18 MED ORDER — LACTATED RINGERS IV SOLN
INTRAVENOUS | Status: DC | PRN
  Administered 2016-01-18 (×3): via INTRAVENOUS

## 2016-01-18 SURGICAL SUPPLY — 66 items
ADHESIVE MASTISOL STRL (MISCELLANEOUS) ×3 IMPLANT
APPLICATOR COTTON TIP 6IN STRL (MISCELLANEOUS) ×6 IMPLANT
APPLIER CLIP 11 MED OPEN (CLIP)
APPLIER CLIP 13 LRG OPEN (CLIP)
BARRIER SKIN 2 1/4 (WOUND CARE) ×2 IMPLANT
BARRIER SKIN 2 1/4INCH (WOUND CARE) ×1
BLADE CLIPPER SURG (BLADE) ×6 IMPLANT
BLADE SURG 15 STRL LF DISP TIS (BLADE) ×1 IMPLANT
BLADE SURG 15 STRL SS (BLADE) ×2
BNDG GAUZE 4.5X4.1 6PLY STRL (MISCELLANEOUS) ×6 IMPLANT
BULB RESERV EVAC DRAIN JP 100C (MISCELLANEOUS) ×9 IMPLANT
CANISTER SUCT 3000ML (MISCELLANEOUS) IMPLANT
CHLORAPREP W/TINT 26ML (MISCELLANEOUS) ×3 IMPLANT
CLIP APPLIE 11 MED OPEN (CLIP) IMPLANT
CLIP APPLIE 13 LRG OPEN (CLIP) IMPLANT
CNTNR SPEC 2.5X3XGRAD LEK (MISCELLANEOUS) ×1
CONT SPEC 4OZ STER OR WHT (MISCELLANEOUS) ×2
CONTAINER SPEC 2.5X3XGRAD LEK (MISCELLANEOUS) ×1 IMPLANT
DRAIN CHANNEL JP 19F (MISCELLANEOUS) ×9 IMPLANT
DRAPE LAPAROTOMY 100X77 ABD (DRAPES) ×3 IMPLANT
DRAPE TABLE BACK 80X90 (DRAPES) ×3 IMPLANT
DRSG TEGADERM 2-3/8X2-3/4 SM (GAUZE/BANDAGES/DRESSINGS) IMPLANT
DRSG TELFA 3X8 NADH (GAUZE/BANDAGES/DRESSINGS) IMPLANT
ELECT BLADE 6.5 EXT (BLADE) ×3 IMPLANT
ELECT REM PT RETURN 9FT ADLT (ELECTROSURGICAL) ×3
ELECTRODE REM PT RTRN 9FT ADLT (ELECTROSURGICAL) ×1 IMPLANT
GAUZE SPONGE 4X4 12PLY STRL (GAUZE/BANDAGES/DRESSINGS) ×3 IMPLANT
GLOVE BIO SURGEON STRL SZ7 (GLOVE) ×18 IMPLANT
GOWN STRL REUS W/ TWL LRG LVL3 (GOWN DISPOSABLE) ×3 IMPLANT
GOWN STRL REUS W/TWL LRG LVL3 (GOWN DISPOSABLE) ×6
HANDLE SUCTION POOLE (INSTRUMENTS) ×1 IMPLANT
HANDLE YANKAUER SUCT BULB TIP (MISCELLANEOUS) IMPLANT
HEMOSTAT SURGICEL 2X14 (HEMOSTASIS) ×3 IMPLANT
LIGASURE IMPACT 36 18CM CVD LR (INSTRUMENTS) ×3 IMPLANT
NEEDLE HYPO 22GX1.5 SAFETY (NEEDLE) ×3 IMPLANT
NEEDLE HYPO 25X1 1.5 SAFETY (NEEDLE) IMPLANT
NS IRRIG 1000ML POUR BTL (IV SOLUTION) ×15 IMPLANT
PACK BASIN MAJOR ARMC (MISCELLANEOUS) ×3 IMPLANT
PAD ABD DERMACEA PRESS 5X9 (GAUZE/BANDAGES/DRESSINGS) ×9 IMPLANT
POUCH DRAIN  2 1/4 MED RED 181 (OSTOMY) ×3 IMPLANT
SPONGE DRAIN TRACH 4X4 STRL 2S (GAUZE/BANDAGES/DRESSINGS) ×9 IMPLANT
SPONGE LAP 18X18 5 PK (GAUZE/BANDAGES/DRESSINGS) ×21 IMPLANT
SPONGE LAP 18X36 2PK (MISCELLANEOUS) ×3 IMPLANT
STAPLER CUT CVD 40MM BLUE (STAPLE) ×3 IMPLANT
STAPLER PROXIMATE 75MM BLUE (STAPLE) ×3 IMPLANT
STAPLER SKIN PROX 35W (STAPLE) ×3 IMPLANT
SUCTION POOLE HANDLE (INSTRUMENTS) ×3
SUT PDS AB 1 CT1 36 (SUTURE) ×12 IMPLANT
SUT PDS AB 1 TP1 96 (SUTURE) IMPLANT
SUT PROLENE 2 0 FS (SUTURE) ×6 IMPLANT
SUT SILK 0 SH 30 (SUTURE) ×9 IMPLANT
SUT SILK 2 0 (SUTURE) ×6
SUT SILK 2 0 SH CR/8 (SUTURE) IMPLANT
SUT SILK 2 0SH CR/8 30 (SUTURE) ×3 IMPLANT
SUT SILK 2-0 18XBRD TIE 12 (SUTURE) ×3 IMPLANT
SUT SILK 3-0 (SUTURE) ×3 IMPLANT
SUT VIC AB 0 CT1 36 (SUTURE) IMPLANT
SUT VIC AB 2-0 SH 27 (SUTURE)
SUT VIC AB 2-0 SH 27XBRD (SUTURE) IMPLANT
SUT VIC AB 3-0 SH 27 (SUTURE) ×6
SUT VIC AB 3-0 SH 27X BRD (SUTURE) ×3 IMPLANT
SYR 20CC LL (SYRINGE) IMPLANT
SYR 3ML LL SCALE MARK (SYRINGE) IMPLANT
SYRINGE 10CC LL (SYRINGE) ×3 IMPLANT
TAPE MICROFOAM 4IN (TAPE) IMPLANT
TRAY FOLEY W/METER SILVER 16FR (SET/KITS/TRAYS/PACK) ×3 IMPLANT

## 2016-01-18 NOTE — Anesthesia Preprocedure Evaluation (Addendum)
Anesthesia Evaluation  Patient identified by MRN, date of birth, ID band  Reviewed: Allergy & Precautions, NPO status , Patient's Chart, lab work & pertinent test results  Airway Mallampati: III       Dental  (+) Teeth Intact   Pulmonary    breath sounds clear to auscultation       Cardiovascular Exercise Tolerance: Good + Peripheral Vascular Disease   Rhythm:Regular Rate:Tachycardia     Neuro/Psych Anxiety Depression    GI/Hepatic negative GI ROS, Neg liver ROS,   Endo/Other    Renal/GU negative Renal ROS     Musculoskeletal negative musculoskeletal ROS (+)   Abdominal Normal abdominal exam  (+)   Peds  Hematology negative hematology ROS (+)   Anesthesia Other Findings   Reproductive/Obstetrics                            Anesthesia Physical Anesthesia Plan  ASA: III and emergent  Anesthesia Plan: General   Post-op Pain Management:    Induction: Intravenous  Airway Management Planned: Oral ETT  Additional Equipment:   Intra-op Plan:   Post-operative Plan: Extubation in OR  Informed Consent: I have reviewed the patients History and Physical, chart, labs and discussed the procedure including the risks, benefits and alternatives for the proposed anesthesia with the patient or authorized representative who has indicated his/her understanding and acceptance.     Plan Discussed with: CRNA  Anesthesia Plan Comments:         Anesthesia Quick Evaluation

## 2016-01-18 NOTE — ED Notes (Signed)
Pt reports since Friday she has been having abd pain generalized and diarrhea. Denies vomiting.

## 2016-01-18 NOTE — ED Notes (Signed)
Patient transported to CT 

## 2016-01-18 NOTE — ED Provider Notes (Signed)
[facility] Emergency Department Provider Note  ____________________________________________  Time seen: Approximately 5:08 PM  I have reviewed the triage vital signs and the nursing notes.   HISTORY  Chief Complaint Abdominal Pain  Patient has a history of chronic memory impairment and history is assisted by her husband  HPI SASHAY Wood is a 75 y.o. female with history of mild memory impairment but no other significant past medical history who presents with relatively acute onset of severe lower abdominal pain that has been essentially constant for the last 2 days.  She states that it waxes and wanes in intensity but has not gone away.  It ranges from mild to severe.  She has had no nausea or vomiting.  She has essentially chronic diarrhea thought to be due to some of her medications she continues to have soft stools but this is not a new finding.  She has not, however, never had abdominal pain like this before.  She describes it as both sharp and aching and movement makes it worse and nothing makes it better.  She denies/chills, chest pain, shortness of breath, headache, lightheadedness, dysuria.   Past Medical History  Diagnosis Date  . Allergy   . Depression   . Hyperlipidemia   . Anxiety   . Osteoporosis     Patient Active Problem List   Diagnosis Date Noted  . Dementia in Alzheimer's disease with late onset 07/27/2015  . Memory loss 05/16/2015  . Arthritis 02/13/2015  . Narrowing of intervertebral disc space 02/13/2015  . Clinical depression 02/13/2015  . Hypercholesteremia 02/13/2015  . Amnesia 02/13/2015  . Symptomatic menopausal or female climacteric states 02/13/2015  . Arthritis, degenerative 02/13/2015  . Avitaminosis D 02/13/2015  . Connective tissue and disc stenosis of intervertebral foramina of abdomen and other regions 02/13/2015  . Bone/cartilage disorder 08/15/2009  . Anxiety state 10/31/2008  . Arthropathia 10/30/2008  .  Leg varices 10/30/2008  . Acquired cyst of kidney 10/30/2008  . Cannot sleep 10/30/2008  . OP (osteoporosis) 10/30/2008  . Cystocele, midline 10/30/2008  . Involutional osteoporosis 10/30/2008    Past Surgical History  Procedure Laterality Date  . Cataract extraction Bilateral 08/2012    [facility]- Dr. Jettie Pagan  . Abdominal hysterectomy  1989    Current Outpatient Rx  Name  Route  Sig  Dispense  Refill  . calcium carbonate (OS-CAL) 600 MG TABS tablet   Oral   Take 1 tablet by mouth daily.         . Cholecalciferol (VITAMIN D3) 1000 units CAPS   Oral   Take 1,000 Units by mouth daily.         Marland Kitchen donepezil (ARICEPT) 10 MG tablet   Oral   Take 10 mg by mouth at bedtime.       11   . loratadine (CLARITIN) 10 MG tablet   Oral   Take 1 tablet by mouth daily as needed for allergies.          . meloxicam (MOBIC) 15 MG tablet   Oral   Take 1 tablet (15 mg total) by mouth daily. Patient taking differently: Take 15 mg by mouth daily as needed for pain.    30 tablet   5   . Acetaminophen 500 MG coapsule   Oral   Take 1 capsule (500 mg total) by mouth every 4 (four) hours as needed for fever.   1 capsule   0     Allergies Galantamine  Family  History  Problem Relation Age of Onset  . Hypertension Mother   . Parkinsonism Mother   . Heart disease Father   . Coronary artery disease Brother   . Heart attack Brother     Social History Social History  Substance Use Topics  . Smoking status: Never Smoker   . Smokeless tobacco: Never Used  . Alcohol Use: No    Review of Systems Constitutional: No fever/chills Eyes: No visual changes. ENT: No sore throat. Cardiovascular: Denies chest pain. Respiratory: Denies shortness of breath. Gastrointestinal: +abdominal pain.  No nausea, no vomiting.  Chronic diarrhea.  No constipation. Genitourinary: Negative for dysuria. Musculoskeletal: Negative for back pain. Skin: Negative for rash. Neurological:  Negative for headaches, focal weakness or numbness.  10-point ROS otherwise negative.  ____________________________________________   PHYSICAL EXAM:  VITAL SIGNS: ED Triage Vitals  Enc Vitals Group     BP 01/18/16 1438 92/57 mmHg     Pulse Rate 01/18/16 1438 116     Resp 01/18/16 1438 18     Temp 01/18/16 1438 98.2 F (36.8 C)     Temp Source 01/18/16 1438 Oral     SpO2 01/18/16 1438 97 %     Weight 01/18/16 1438 121 lb (54.885 kg)     Height 01/18/16 1438  (1.702 m)     Head Cir --      Peak Flow --      Pain Score 01/18/16 1439 5     Pain Loc --      Pain Edu? --      Excl. in GC? --     Constitutional: Alert and oriented. Well appearing and in no acute distress. Eyes: Conjunctivae are normal. PERRL. EOMI. Head: Atraumatic. Nose: No congestion/rhinnorhea. Mouth/Throat: Mucous membranes are moist.  Oropharynx non-erythematous. Neck: No stridor.  No meningeal signs.   Cardiovascular: Tachycardia, regular rhythm. Good peripheral circulation. Grossly normal heart sounds.  The pressure is a little bit low with systolics consistently below 100. Respiratory: Normal respiratory effort.  No retractions. Lungs CTAB. Gastrointestinal: Soft with moderate to severe tenderness to palpation primarily of the lower abdomen with no specific laterality.  No rigidity.  No Upper abdominal tenderness. Musculoskeletal: No lower extremity tenderness nor edema. No gross deformities of extremities. Neurologic:  Normal speech and language. No gross focal neurologic deficits are appreciated.  Skin:  Skin is warm, dry and intact. No rash noted. Psychiatric: Mood and affect are normal. Speech and behavior are normal.  ____________________________________________   LABS (all labs ordered are listed, but only abnormal results are displayed)  Labs Reviewed  COMPREHENSIVE METABOLIC PANEL - Abnormal; Notable for the following:    Sodium 133 (*)    Potassium 3.2 (*)    CO2 20 (*)    Glucose,  Bld 110 (*)    BUN 36 (*)    Creatinine, Ser 1.45 (*)    Calcium 8.7 (*)    Albumin 3.2 (*)    GFR calc non Af Amer 35 (*)    GFR calc Af Amer 40 (*)    All other components within normal limits  CBC - Abnormal; Notable for the following:    RBC 3.24 (*)    Hemoglobin 10.4 (*)    HCT 30.4 (*)    All other components within normal limits  URINALYSIS COMPLETEWITH MICROSCOPIC ([facility] ONLY) - Abnormal; Notable for the following:    Color, Urine YELLOW (*)    APPearance CLEAR (*)    Ketones, ur TRACE (*)  Hgb urine dipstick 3+ (*)    All other components within normal limits  PROTIME-INR - Abnormal; Notable for the following:    Prothrombin Time 17.0 (*)    All other components within normal limits  APTT - Abnormal; Notable for the following:    aPTT 41 (*)    All other components within normal limits  LIPASE, BLOOD  LACTIC ACID, PLASMA  LACTIC ACID, PLASMA  TYPE AND SCREEN  PREPARE RBC (CROSSMATCH)  ABO/RH   ____________________________________________  EKG  ED ECG REPORT I, Aadon Gorelik, the attending physician, personally viewed and interpreted this ECG.   Date: 01/18/2016  EKG Time: 14:34  Rate: 117  Rhythm: sinus tachycardia  Axis: Normal  Intervals:Normal  ST&T Change: Non-specific ST segment / T-wave changes, but no evidence of acute ischemia.   ____________________________________________  RADIOLOGY   Ct Abdomen Pelvis W Contrast  01/18/2016  ADDENDUM REPORT: 01/18/2016 19:52 ADDENDUM: These results were called by telephone at the time of interpretation on 01/18/2016 at 7:51 pm to Dr. Loleta Rose , who verbally acknowledged these results. Electronically Signed   By: Elgie Collard M.D.   On: 01/18/2016 19:52  01/18/2016  CLINICAL DATA:  75 year old female with abdominal pain and diarrhea. No surgery. EXAM: CT ABDOMEN AND PELVIS WITH CONTRAST TECHNIQUE: Multidetector CT imaging of the abdomen and pelvis was performed using the standard protocol following  bolus administration of intravenous contrast. CONTRAST:  75mL ISOVUE-300 IOPAMIDOL (ISOVUE-300) INJECTION 61% COMPARISON:  CT dated 10/09/2007 FINDINGS: Linear bibasilar atelectasis/ scarring. There is large pneumoperitoneum. There is diffuse stranding of the mesentery and omental fat. Small ascites. Mild apparent fatty infiltration of the liver. A 1 cm hypodense lesion in the left lobe of the liver is not well characterized but likely represents a cyst or hemangioma. The gallbladder is mildly distended. No calcified gallstone identified. The pancreas, spleen, adrenal glands appear unremarkable. There bilateral renal parapelvic cysts. Multiple smaller renal hypodense lesions are not well characterized but likely represent cysts. There is no hydronephrosis on either side. The visualized ureters and urinary bladder appear unremarkable. Hysterectomy. There is sigmoid diverticulosis with muscular hypertrophy. No active inflammatory changes. There is inflammatory changes and thickening of the body, and antrum of the stomach. Multiple loculated extraluminal air noted in the anterior abdomen and along the greater curvature of the stomach. No extraluminal extravasation of oral contrast identified. There is no evidence of bowel obstruction. The visualized appendix appears unremarkable. There is mild aortoiliac atherosclerotic disease. No portal venous gas identified. There is no adenopathy. There is a small fat containing umbilical hernia. There is mild osteopenia and degenerative changes of the spine. No acute fracture. IMPRESSION: Large amount of pneumoperitoneum most concerning for bowel perforation. The site of presumed bowel perforation is not identified with certainty on the CT. There is thickening of the body and antrum of the stomach. No extraluminal oral contrast identified. Clinical correlation and surgical consult is advised. Sigmoid diverticulosis without active inflammatory changes. Electronically Signed: By:  Elgie Collard M.D. On: 01/18/2016 19:39    ____________________________________________   PROCEDURES  Procedure(s) performed: None  Critical Care performed: Yes, see critical care note(s)   CRITICAL CARE Performed by: Loleta Rose   Total critical care time: 45 minutes  Critical care time was exclusive of separately billable procedures and treating other patients.  Critical care was necessary to treat or prevent imminent or life-threatening deterioration.  Critical care was time spent personally by me on the following activities: development of treatment plan with patient and/or  surrogate as well as nursing, discussions with consultants, evaluation of patient's response to treatment, examination of patient, obtaining history from patient or surrogate, ordering and performing treatments and interventions, ordering and review of laboratory studies, ordering and review of radiographic studies, pulse oximetry and re-evaluation of patient's condition.  ____________________________________________   INITIAL IMPRESSION / ASSESSMENT AND PLAN / ED COURSE  Pertinent labs & imaging results that were available during my care of the patient were reviewed by me and considered in my medical decision making (see chart for details).  The patient has a markedly tender abdomen but it is soft and nondistended.  Her lab work is unremarkable but she is mildly tachycardic.  The history is limited due to her dementia although the husband is very helpful.  Also has a low blood pressure but this may be normal for her and her habitus.  I will proceed with a CT scan of her abdomen and pelvis with oral and IV contrast.  Her renal function is depressed but I have no old values against which to compare.  Given the importance of obtaining a good scan, I will give her 1 L of fluids and I informed radiology to use a lower dose of IV contrast if possible.   (Note that documentation was delayed due to multiple ED  patients requiring immediate care.)  I was informed by the CT allergist that there is free air in her abdomen.  I reviewed the images myself and concur that there is a significant amount of pneumoperitoneum but I cannot identify a source.  I sequentially spoke by phone with the radiologist who also concurred that there is significant pneumoperitoneum that without a specific source but it is most likely some sort of bowel perforation.  I immediately had the nurse administer Zosyn 3.375 g IV of antibiotics, we established a second PIV, and I contacted general surgery, Dr. Everlene FarrierPabon, who came to the emergency department and evaluated the patient in person.  We also spoke in person and he will admit the patient and take her directly to the operating room.  ____________________________________________  FINAL CLINICAL IMPRESSION(S) / ED DIAGNOSES  Final diagnoses:  Pneumoperitoneum  Bowel perforation (HCC)  Generalized abdominal pain      NEW MEDICATIONS STARTED DURING THIS VISIT:  New Prescriptions   No medications on file      Note:  This document was prepared using Dragon voice recognition software and may include unintentional dictation errors.   Loleta Roseory Catharine Kettlewell, MD 01/18/16 2104

## 2016-01-18 NOTE — H&P (Signed)
Patient ID: Katherine Wood, female   DOB: 1941/06/08, 75 y.o.   MRN: [id number]  History of Present Illness Katherine Wood is a 75 y.o. female with 3 air. She reports that over the last 3 days she started having lower abdominal pain, sharp and severe intermittent and over the last 24 hours has remained constant. Of note the history is taken mostly from the husband with the care giver. She does have some dementia and is very functional she is able to perform more than 4 Mets of activity without any shortness of breath or chest pain. She feeds and grroms herself, and she is continent for urine and bowel movements. She reports some watery bowel movements and some nausea. No emesis. Decreased appetite. Only abdominal surgery is a hysterectomy  Past Medical History Past Medical History  Diagnosis Date  . Allergy   . Depression   . Hyperlipidemia   . Anxiety   . Osteoporosis      Past Surgical History  Procedure Laterality Date  . Cataract extraction Bilateral 08/2012    [facility]- Dr. Jettie Pagan  . Abdominal hysterectomy  1989    Allergies  Allergen Reactions  . Galantamine Other (See Comments)    Current Facility-Administered Medications  Medication Dose Route Frequency Provider Last Rate Last Dose  . 0.9 %  sodium chloride infusion   Intravenous Once Loleta Rose, MD      . piperacillin-tazobactam (ZOSYN) 3.375 GM/50ML IVPB           . piperacillin-tazobactam (ZOSYN) IVPB 3.375 g  3.375 g Intravenous STAT Loleta Rose, MD 12.5 mL/hr at 01/18/16 1945 3.375 g at 01/18/16 1945   Current Outpatient Prescriptions  Medication Sig Dispense Refill  . Acetaminophen 500 MG coapsule Take 1 capsule (500 mg total) by mouth every 4 (four) hours as needed for fever. 1 capsule 0  . calcium carbonate (OS-CAL) 600 MG TABS tablet Take 1 tablet by mouth daily.    . Cholecalciferol (VITAMIN D) 2000 UNITS CAPS Take 1 tablet by mouth daily.    Marland Kitchen donepezil (ARICEPT) 10 MG tablet   11  .  loratadine (CLARITIN) 10 MG tablet Take 1 tablet by mouth daily as needed.     . meloxicam (MOBIC) 15 MG tablet Take 1 tablet (15 mg total) by mouth daily. (Patient taking differently: Take 15 mg by mouth. ) 30 tablet 5    Family History Family History  Problem Relation Age of Onset  . Hypertension Mother   . Parkinsonism Mother   . Heart disease Father   . Coronary artery disease Brother   . Heart attack Brother      Social History Social History  Substance Use Topics  . Smoking status: Never Smoker   . Smokeless tobacco: Never Used  . Alcohol Use: No     ROS upon review of system was performed and is otherwise negative other than what is stated in the history of present illness   Physical Exam Blood pressure 97/59, pulse 102, temperature 98.2 F (36.8 C), temperature source Oral, resp. rate 18, height  (1.702 m), weight 54.885 kg (121 lb), SpO2 96 %.  CONSTITUTIONAL: NAD, alert, forgets things easily, competent and understands the situation EYES: Pupils equal, round, and reactive to light, Sclera non-icteric. EARS, NOSE, MOUTH AND THROAT: The oropharynx is clear. Oral mucosa is pink and moist. Hearing is intact to voice.  NECK: Trachea is midline, and there is no jugular venous distension. Thyroid is without palpable abnormalities.  LYMPH NODES:  Lymph nodes in the neck are not enlarged. RESPIRATORY:  Lungs are clear, and breath sounds are equal bilaterally. Normal respiratory effort without pathologic use of accessory muscles. CARDIOVASCULAR: Heart is regular without murmurs, gallops, or rubs. GI: The abdomen is soft, TTP in lower abdomen w rebound tenderness. No masses. here were no palpable masses. There was no hepatosplenomegaly. There were normal bowel sounds. MUSCULOSKELETAL:  Normal muscle strength and tone in all four extremities.    SKIN: Skin turgor is normal. There are no pathologic skin lesions.  NEUROLOGIC:  Motor and sensation is grossly normal.  Cranial  nerves are grossly intact. PSYCH:  Alert and oriented to person, place and time. Affect is normal.  Data Reviewed The scan personally reviewed there is significant amount of free air there is diverticulitis and questionable thickened stomach. No evidence of abscess and no evidence of any definitive site of perforation  I have personally reviewed the patient's imaging and medical records.    Assessment/Plan   75 year old female with some dementia but otherwise healthy now with free air and acute abdominal pain. Etiologies would include perforated diverticulitis based on her clinical findings versus perforated ulcer. Discussed with the patient and the family in detail about the situation. Patient and husband are well aware of the situation and they want to have everything done. A patient wishes to have an exploratory laparotomy and possible bowel resection possible colectomy and possible colostomy. I have explained to them the situation in detail, risk, benefits and possible complications, including but not limited to: Bleeding, infection, re-intervention, prolonged hospital stay. They both understand and wish to proceed. Informed consent was obtained and witnessed by RN. Extensive counseling provided. Will and resuscitated preoperatively start IV antibiotics and posted for exploratory laparotomy  as an emergency tonight  Sterling Bigiego Quinlin Conant, MD FACS  Jairus Tonne F Militza Devery 01/18/2016, 8:17 PM

## 2016-01-18 NOTE — ED Notes (Signed)
OR called and ready for pt. ED tech to transport.

## 2016-01-18 NOTE — Op Note (Signed)
Pre-operative Diagnosis: Pneumoperitoneum  Post-operative Diagnosis: Perforated diverticulitis  Procedures: #1 Exploratory laparotomy #2 lysis of thick adhesions #3 Hartmann's procedure #4 repair of umbilical hernia #5 placement of Blake drains 3 # 6 drainage of intra abdominal abscess  Surgeon: Merri Ray Shereka Lafortune   Anesthesia: General endotracheal anesthesia  ASA Class: 3E   Surgeon: Sterling Big , MD FACS  Anesthesia: Gen. with endotracheal tube   Findings: 1. Inflammatory reaction and gram curvature stomach but no definitive evidence of peptic ulcer perforation. 2. Purulent peritonitis with possible in the left and right paracolic gutter 3. Perforated sigmoid diverticulitis 4. Thick adhesions from the small bowel to the sigmoid colon and from the greater omentum to the stomach 5. Reducible umbilical hernia  Estimated Blood Loss: 250cc         Drains: Left and right paracolic gutter and pelvis # 19 Lake drains 3         Specimens: Sigmoid colon       Complications: None         Procedure Details  The patient was seen again in the Holding Room. The benefits, complications, treatment options, and expected outcomes were discussed with the patient. The risks of bleeding, infection, recurrence of symptoms, failure to resolve symptoms,  bowel injury, any of which could require further surgery were reviewed with the patient.   The patient was taken to Operating Room, identified as Katherine Wood and the procedure verified.  A Time Out was held and the above information confirmed.  Prior to the induction of general anesthesia, antibiotic prophylaxis was administered. VTE prophylaxis was in place. General endotracheal anesthesia was then administered and tolerated well. After the induction, the abdomen was prepped with Chloraprep and draped in the sterile fashion. The patient was positioned in the supine position.  Laparotomy was performed form the subxiphoid region  although it appears and when control umbilical hernia that was reducible and were able to excise the sac and entered the the under direct visualization. There was thick purulent fluid uppon entering the abdominal cavity, and also found thickened but it stomach with a thickened omentum and inflammatory reaction there was an abscess in this area that was drained in the standard fashion. Because of the concern of perforated ulcer I entered the lesser sac and examined the posterior aspect of the stomach as well as the duodenum. There was no evidence of perforated ulcer and NG tube was inserted and methylene blue was placed. No evidence of leak. There was significant purulent material in the left paracolic and right paracolic gutter and we were able to aspirate all this hospital. And now the small bowel was run from the ligament of Treitz all the way to the cecum and there was no evidence of perforation. There was a piece of bowel that was adhered to the sigmoid colon this adhesions were taken down with sharp Metzenbaum scissors in the standard fashion. After with able to free this loop we completed our inspection of the small bowel with no evidence of perforation . Attention was turned to the colon there was evidence of diverticulitis of the sigmoid colon with perforation and pus. We were able to mobilize the sigmoid colon from a lateral to medial fashion we were able to identify and preserve the left ureter. Attention was turned to the pedicle of the sigmoid and the left colic pedicle. This was suture ligated with 2-0 silk in a standard fashion. Using the LigaSure device were able to divide the mesocolon from  the sigmoid all the way to the pelvis. The mesorectum was divided with LigaSure device in the standard fashion. Once we were able to dissect the distal sigmoid colon and elevate the rectum we had a good window for transection. A contractor blue device was used to divide the distal aspect of the sigmoid rectal  junction. We then turned attention to the proximal aspect and I dissected the descending colon and selected a viable and healthy descending colon and divided it with Endo GIA stapler load in the standard fashion. We made sure that there was an have adequate vascular supply to the colostomy and an adequate mobilization. We visualized a small tear in the spleen that was probably controlled with Surgicel and pressure. No evidence of any active hemorrhage or any intra-abdominal injuries. He placed 3 Blake drains in the left and right paracolic gutters and also in the pelvis under direct sensation. Abdominal cavity was irrigated profusely with several liters of normal saline. The fascia was closed incorporating the umbilical hernia defect using a #1 PDS suture in a standard fashion. I were able to create a hole for the colostomy the left lower quadrant and we matured the colostomy with interrupted 3-0 Vicryl in standard fashion. Colostomy bag was placed. Because of the degree of contamination we decided to leave the skin open and place which dry on the wound. Marking clip percent with epinephrine was injected along the incision site. No immediate complications,needle and laparotomy counts were correct    Sterling Bigiego Melony Tenpas, MD, FACS

## 2016-01-18 NOTE — Anesthesia Procedure Notes (Signed)
Procedure Name: Intubation Date/Time: 01/18/2016 9:06 PM Performed by: ZOXWRUEKILDUFF, Sophiagrace Benbrook Pre-anesthesia Checklist: Timeout performed, Patient being monitored, Suction available, Patient identified and Emergency Drugs available Patient Re-evaluated:Patient Re-evaluated prior to inductionOxygen Delivery Method: Circle system utilized Preoxygenation: Pre-oxygenation with 100% oxygen Intubation Type: IV induction, Rapid sequence and Cricoid Pressure applied Laryngoscope Size: Mac and 3 Grade View: Grade II Tube size: 7.0 mm Number of attempts: 1 Airway Equipment and Method: Stylet Placement Confirmation: breath sounds checked- equal and bilateral,  CO2 detector,  positive ETCO2 and ETT inserted through vocal cords under direct vision Secured at: 21 cm Tube secured with: Tape

## 2016-01-19 ENCOUNTER — Encounter: Payer: Self-pay | Admitting: Surgery

## 2016-01-19 ENCOUNTER — Inpatient Hospital Stay: Payer: Medicare Other

## 2016-01-19 DIAGNOSIS — D638 Anemia in other chronic diseases classified elsewhere: Secondary | ICD-10-CM | POA: Diagnosis present

## 2016-01-19 DIAGNOSIS — Z791 Long term (current) use of non-steroidal anti-inflammatories (NSAID): Secondary | ICD-10-CM | POA: Diagnosis not present

## 2016-01-19 DIAGNOSIS — K5720 Diverticulitis of large intestine with perforation and abscess without bleeding: Secondary | ICD-10-CM | POA: Diagnosis present

## 2016-01-19 DIAGNOSIS — Z8249 Family history of ischemic heart disease and other diseases of the circulatory system: Secondary | ICD-10-CM | POA: Diagnosis not present

## 2016-01-19 DIAGNOSIS — F0390 Unspecified dementia without behavioral disturbance: Secondary | ICD-10-CM | POA: Diagnosis present

## 2016-01-19 DIAGNOSIS — F0280 Dementia in other diseases classified elsewhere without behavioral disturbance: Secondary | ICD-10-CM

## 2016-01-19 DIAGNOSIS — K469 Unspecified abdominal hernia without obstruction or gangrene: Secondary | ICD-10-CM | POA: Insufficient documentation

## 2016-01-19 DIAGNOSIS — K631 Perforation of intestine (nontraumatic): Secondary | ICD-10-CM | POA: Diagnosis not present

## 2016-01-19 DIAGNOSIS — Z681 Body mass index (BMI) 19 or less, adult: Secondary | ICD-10-CM | POA: Diagnosis not present

## 2016-01-19 DIAGNOSIS — E876 Hypokalemia: Secondary | ICD-10-CM | POA: Diagnosis present

## 2016-01-19 DIAGNOSIS — G301 Alzheimer's disease with late onset: Secondary | ICD-10-CM | POA: Diagnosis present

## 2016-01-19 DIAGNOSIS — Z5331 Laparoscopic surgical procedure converted to open procedure: Secondary | ICD-10-CM | POA: Diagnosis not present

## 2016-01-19 DIAGNOSIS — B952 Enterococcus as the cause of diseases classified elsewhere: Secondary | ICD-10-CM | POA: Diagnosis present

## 2016-01-19 DIAGNOSIS — B9689 Other specified bacterial agents as the cause of diseases classified elsewhere: Secondary | ICD-10-CM | POA: Diagnosis present

## 2016-01-19 DIAGNOSIS — E860 Dehydration: Secondary | ICD-10-CM | POA: Diagnosis not present

## 2016-01-19 DIAGNOSIS — B965 Pseudomonas (aeruginosa) (mallei) (pseudomallei) as the cause of diseases classified elsewhere: Secondary | ICD-10-CM | POA: Diagnosis present

## 2016-01-19 DIAGNOSIS — K429 Umbilical hernia without obstruction or gangrene: Secondary | ICD-10-CM | POA: Diagnosis present

## 2016-01-19 DIAGNOSIS — Z79899 Other long term (current) drug therapy: Secondary | ICD-10-CM | POA: Diagnosis not present

## 2016-01-19 DIAGNOSIS — K5669 Other intestinal obstruction: Secondary | ICD-10-CM | POA: Diagnosis not present

## 2016-01-19 DIAGNOSIS — Z820 Family history of epilepsy and other diseases of the nervous system: Secondary | ICD-10-CM | POA: Diagnosis not present

## 2016-01-19 DIAGNOSIS — K660 Peritoneal adhesions (postprocedural) (postinfection): Secondary | ICD-10-CM | POA: Diagnosis present

## 2016-01-19 DIAGNOSIS — Z1621 Resistance to vancomycin: Secondary | ICD-10-CM | POA: Diagnosis present

## 2016-01-19 DIAGNOSIS — E43 Unspecified severe protein-calorie malnutrition: Secondary | ICD-10-CM | POA: Diagnosis present

## 2016-01-19 DIAGNOSIS — I471 Supraventricular tachycardia: Secondary | ICD-10-CM | POA: Diagnosis not present

## 2016-01-19 DIAGNOSIS — I10 Essential (primary) hypertension: Secondary | ICD-10-CM | POA: Diagnosis present

## 2016-01-19 DIAGNOSIS — E785 Hyperlipidemia, unspecified: Secondary | ICD-10-CM | POA: Diagnosis present

## 2016-01-19 DIAGNOSIS — K651 Peritoneal abscess: Secondary | ICD-10-CM | POA: Diagnosis not present

## 2016-01-19 DIAGNOSIS — K668 Other specified disorders of peritoneum: Secondary | ICD-10-CM | POA: Diagnosis present

## 2016-01-19 HISTORY — DX: Diverticulitis of large intestine with perforation and abscess without bleeding: K57.20

## 2016-01-19 LAB — BLOOD GAS, ARTERIAL
Acid-base deficit: 0 mmol/L (ref 0.0–2.0)
BICARBONATE: 22.9 meq/L (ref 21.0–28.0)
FIO2: 0.4
Mechanical Rate: 8
O2 SAT: 99.6 %
PATIENT TEMPERATURE: 37
PCO2 ART: 30 mmHg — AB (ref 32.0–48.0)
PEEP/CPAP: 5 cmH2O
PO2 ART: 166 mmHg — AB (ref 83.0–108.0)
VT: 450 mL
pH, Arterial: 7.49 — ABNORMAL HIGH (ref 7.350–7.450)

## 2016-01-19 LAB — CBC
HCT: 27.7 % — ABNORMAL LOW (ref 35.0–47.0)
HEMOGLOBIN: 9.5 g/dL — AB (ref 12.0–16.0)
MCH: 32.2 pg (ref 26.0–34.0)
MCHC: 34.2 g/dL (ref 32.0–36.0)
MCV: 94 fL (ref 80.0–100.0)
Platelets: 271 10*3/uL (ref 150–440)
RBC: 2.94 MIL/uL — ABNORMAL LOW (ref 3.80–5.20)
RDW: 14.1 % (ref 11.5–14.5)
WBC: 8.7 10*3/uL (ref 3.6–11.0)

## 2016-01-19 LAB — PHOSPHORUS: PHOSPHORUS: 3.9 mg/dL (ref 2.5–4.6)

## 2016-01-19 LAB — COMPREHENSIVE METABOLIC PANEL
ALBUMIN: 2.1 g/dL — AB (ref 3.5–5.0)
ALK PHOS: 46 U/L (ref 38–126)
ALT: 19 U/L (ref 14–54)
AST: 39 U/L (ref 15–41)
Anion gap: 9 (ref 5–15)
BILIRUBIN TOTAL: 1.1 mg/dL (ref 0.3–1.2)
BUN: 24 mg/dL — ABNORMAL HIGH (ref 6–20)
CHLORIDE: 107 mmol/L (ref 101–111)
CO2: 21 mmol/L — ABNORMAL LOW (ref 22–32)
CREATININE: 0.97 mg/dL (ref 0.44–1.00)
Calcium: 7.4 mg/dL — ABNORMAL LOW (ref 8.9–10.3)
GFR calc Af Amer: >60 mL/min (ref >60)
GFR calc non Af Amer: 57 mL/min — ABNORMAL LOW (ref >60)
Glucose, Bld: 170 mg/dL — ABNORMAL HIGH (ref 65–99)
POTASSIUM: 3.1 mmol/L — AB (ref 3.5–5.1)
Sodium: 137 mmol/L (ref 135–145)
Total Protein: 5.2 g/dL — ABNORMAL LOW (ref 6.5–8.1)

## 2016-01-19 LAB — LACTIC ACID, PLASMA: LACTIC ACID, VENOUS: 2.2 mmol/L — AB (ref 0.5–2.0)

## 2016-01-19 LAB — MRSA PCR SCREENING: MRSA by PCR: NEGATIVE

## 2016-01-19 LAB — MAGNESIUM
MAGNESIUM: 1.5 mg/dL — AB (ref 1.7–2.4)
Magnesium: 2.9 mg/dL — ABNORMAL HIGH (ref 1.7–2.4)

## 2016-01-19 LAB — GLUCOSE, CAPILLARY: Glucose-Capillary: 121 mg/dL — ABNORMAL HIGH (ref 65–99)

## 2016-01-19 LAB — POTASSIUM: POTASSIUM: 3.3 mmol/L — AB (ref 3.5–5.1)

## 2016-01-19 MED ORDER — CHLORHEXIDINE GLUCONATE 0.12% ORAL RINSE (MEDLINE KIT)
15.0000 mL | Freq: Two times a day (BID) | OROMUCOSAL | Status: DC
  Administered 2016-01-19 – 2016-02-14 (×41): 15 mL via OROMUCOSAL
  Filled 2016-01-19 (×61): qty 15

## 2016-01-19 MED ORDER — PIPERACILLIN-TAZOBACTAM 3.375 G IVPB
3.3750 g | Freq: Three times a day (TID) | INTRAVENOUS | Status: DC
  Administered 2016-01-19 – 2016-01-25 (×19): 3.375 g via INTRAVENOUS
  Filled 2016-01-19 (×23): qty 50

## 2016-01-19 MED ORDER — ENOXAPARIN SODIUM 40 MG/0.4ML ~~LOC~~ SOLN
40.0000 mg | SUBCUTANEOUS | Status: DC
  Administered 2016-01-19 – 2016-01-25 (×7): 40 mg via SUBCUTANEOUS
  Filled 2016-01-19 (×7): qty 0.4

## 2016-01-19 MED ORDER — ACETAMINOPHEN 10 MG/ML IV SOLN
1000.0000 mg | Freq: Four times a day (QID) | INTRAVENOUS | Status: AC
  Administered 2016-01-19 – 2016-01-20 (×4): 1000 mg via INTRAVENOUS
  Filled 2016-01-19 (×4): qty 100

## 2016-01-19 MED ORDER — HALOPERIDOL LACTATE 5 MG/ML IJ SOLN
2.0000 mg | Freq: Four times a day (QID) | INTRAMUSCULAR | Status: DC | PRN
  Administered 2016-01-20 – 2016-02-06 (×5): 2 mg via INTRAVENOUS
  Filled 2016-01-19 (×5): qty 1

## 2016-01-19 MED ORDER — MIDAZOLAM HCL 5 MG/5ML IJ SOLN
INTRAMUSCULAR | Status: AC
  Filled 2016-01-19: qty 5

## 2016-01-19 MED ORDER — MORPHINE SULFATE (PF) 2 MG/ML IV SOLN
2.0000 mg | INTRAVENOUS | Status: DC | PRN
  Administered 2016-01-22 – 2016-01-23 (×2): 2 mg via INTRAVENOUS
  Filled 2016-01-19 (×2): qty 1

## 2016-01-19 MED ORDER — DIPHENHYDRAMINE HCL 50 MG/ML IJ SOLN: 12.5000 mg | Freq: Four times a day (QID) | INTRAMUSCULAR | Status: DC | PRN

## 2016-01-19 MED ORDER — ONDANSETRON HCL 4 MG/2ML IJ SOLN: 4.0000 mg | Freq: Once | INTRAMUSCULAR | Status: DC | PRN

## 2016-01-19 MED ORDER — POTASSIUM CHLORIDE 10 MEQ/100ML IV SOLN
10.0000 meq | INTRAVENOUS | Status: AC
  Administered 2016-01-19 (×5): 10 meq via INTRAVENOUS
  Filled 2016-01-19 (×5): qty 100

## 2016-01-19 MED ORDER — ANTISEPTIC ORAL RINSE SOLUTION (CORINZ)
7.0000 mL | Freq: Four times a day (QID) | OROMUCOSAL | Status: DC
  Administered 2016-01-19 – 2016-02-14 (×53): 7 mL via OROMUCOSAL
  Filled 2016-01-19 (×108): qty 7

## 2016-01-19 MED ORDER — FENTANYL CITRATE (PF) 100 MCG/2ML IJ SOLN: 25.0000 ug | INTRAMUSCULAR | Status: DC | PRN

## 2016-01-19 MED ORDER — MIDAZOLAM HCL 2 MG/2ML IJ SOLN: 1.0000 mg | INTRAMUSCULAR | Status: DC | PRN

## 2016-01-19 MED ORDER — ONDANSETRON HCL 4 MG/2ML IJ SOLN: 4.0000 mg | Freq: Four times a day (QID) | INTRAMUSCULAR | Status: DC | PRN

## 2016-01-19 MED ORDER — MAGNESIUM SULFATE 4 GM/100ML IV SOLN
4.0000 g | Freq: Once | INTRAVENOUS | Status: AC
  Administered 2016-01-19: 4 g via INTRAVENOUS
  Filled 2016-01-19: qty 100

## 2016-01-19 MED ORDER — NALOXONE HCL 0.4 MG/ML IJ SOLN: 0.4000 mg | INTRAMUSCULAR | Status: DC | PRN

## 2016-01-19 MED ORDER — MORPHINE SULFATE 2 MG/ML IV SOLN: INTRAVENOUS | Status: DC

## 2016-01-19 MED ORDER — PANTOPRAZOLE SODIUM 40 MG IV SOLR
40.0000 mg | Freq: Every day | INTRAVENOUS | Status: DC
  Administered 2016-01-19 – 2016-01-30 (×12): 40 mg via INTRAVENOUS
  Filled 2016-01-19 (×13): qty 40

## 2016-01-19 MED ORDER — FENTANYL CITRATE (PF) 100 MCG/2ML IJ SOLN
25.0000 ug | INTRAMUSCULAR | Status: DC | PRN
  Administered 2016-01-19 (×2): 50 ug via INTRAVENOUS
  Filled 2016-01-19 (×2): qty 2

## 2016-01-19 MED ORDER — KETOROLAC TROMETHAMINE 15 MG/ML IJ SOLN
15.0000 mg | Freq: Four times a day (QID) | INTRAMUSCULAR | Status: AC | PRN
  Administered 2016-01-19 – 2016-01-24 (×10): 15 mg via INTRAVENOUS
  Filled 2016-01-19 (×15): qty 1

## 2016-01-19 MED ORDER — DEXTROSE IN LACTATED RINGERS 5 % IV SOLN
INTRAVENOUS | Status: AC
  Administered 2016-01-19 – 2016-01-29 (×19): via INTRAVENOUS

## 2016-01-19 MED ORDER — SODIUM CHLORIDE 0.9% FLUSH: 9.0000 mL | INTRAVENOUS | Status: DC | PRN

## 2016-01-19 MED ORDER — MIDAZOLAM HCL 5 MG/5ML IJ SOLN
1.0000 mg | Freq: Once | INTRAMUSCULAR | Status: AC
  Administered 2016-01-19: 1 mg via INTRAVENOUS

## 2016-01-19 MED ORDER — DEXTROSE 5 % IV SOLN
0.0000 ug/min | INTRAVENOUS | Status: DC
  Filled 2016-01-19: qty 1

## 2016-01-19 MED ORDER — ONDANSETRON HCL 4 MG/2ML IJ SOLN
4.0000 mg | INTRAMUSCULAR | Status: DC | PRN
  Administered 2016-02-10 – 2016-02-11 (×3): 4 mg via INTRAVENOUS
  Filled 2016-01-19 (×3): qty 2

## 2016-01-19 MED ORDER — HEPARIN SODIUM (PORCINE) 5000 UNIT/ML IJ SOLN
5000.0000 [IU] | Freq: Three times a day (TID) | INTRAMUSCULAR | Status: DC
  Administered 2016-01-19: 5000 [IU] via SUBCUTANEOUS
  Filled 2016-01-19: qty 1

## 2016-01-19 MED ORDER — DIPHENHYDRAMINE HCL 12.5 MG/5ML PO ELIX: 12.5000 mg | ORAL_SOLUTION | Freq: Four times a day (QID) | ORAL | Status: DC | PRN

## 2016-01-19 NOTE — Progress Notes (Signed)
Called [facility], pt alert and following commands. [facility] to [facility] self. [facility].

## 2016-01-19 NOTE — Progress Notes (Signed)
Orders received. Vent changes made per new order.

## 2016-01-19 NOTE — Consult Note (Signed)
WOC ostomy consult note Stoma type/location: LLQ Colostomy  Patient was just extubated.  Is asleep.  Will begin ostomy teaching in the AM.  Written materials left at bedside.  WOC team will follow.  Maple HudsonKaren Alexandar Weisenberger RN BSN CWON Pager [phone]

## 2016-01-19 NOTE — Transfer of Care (Signed)
Immediate Anesthesia Transfer of Care Note  Patient: Katherine Wood  Procedure(s) Performed: Procedure(s): EXPLORATORY LAPAROTOMY, LYSIS OF ADHESIONS, DRAINAGE OF INTRABDOMINAL ABSCESS, HARTMAN'S PROCEDURE, UMBILICAL HERNIA REPAIR  (N/A)  Patient Location: PACU  Anesthesia Type:General  Level of Consciousness: sedated  Airway & Oxygen Therapy: Patient remains intubated per anesthesia plan  Post-op Assessment: Report given to RN  Post vital signs: Reviewed and stable  Last Vitals:  Filed Vitals:   01/18/16 2016 01/19/16 0020  BP: 118/67 101/71  Pulse: 103 96  Temp:  36.9 C  Resp: 18 20    Complications: No apparent anesthesia complications

## 2016-01-19 NOTE — Consult Note (Signed)
PULMONARY / CRITICAL CARE MEDICINE   Name: Katherine Wood MRN: [id number] DOB: 08-03-1941    ADMISSION DATE:  01/18/2016 CONSULTATION DATE:  01/19/16  REFERRING MD : Dr. Dahlia Byes   CHIEF COMPLAINT:     Reason for consult - vent management.    HISTORY OF PRESENT ILLNESS  Unable to obtain hx due to pt on vent - Hx per chart review  75 year old female brought to the hospital on 01/18/2016 for lower abdominal pain 3 days, sharp, intermittent, constant, CT scan found to have intraperitoneal free air. Urgently taken for surgical intervention, now status post exploratory laparotomy with findings of lysis of thick adhesions, Hartman's procedure, repair of umbilical hernia, placement of plate drains 3, drainage intra-abdominal abscess (purulent material in the left pericolic and right paracolic gutter). Show with past medical history of mild dementia on Aricept, osteoarthritis, depression, seasonal allergies, hyperlipidemia, anxiety, no other significant past medical history.  Right hospitalization had complaints of watery bowel movements, nausea, no vomiting, positive decreased appetite. Past surgical history significant for hysterectomy.      SIGNIFICANT EVENTS  3/26- perforated diverticulitis, s/p ex lap/lysis of adhesion/hartmann's/drainage of intra abdominal abscess, transferred to ICU intubated. 3/27-extubated   PAST MEDICAL HISTORY    :  Past Medical History  Diagnosis Date  . Allergy   . Depression   . Hyperlipidemia   . Anxiety   . Osteoporosis    Past Surgical History  Procedure Laterality Date  . Cataract extraction Bilateral 08/2012    [facility]- Dr. Dolores Lory  . Abdominal hysterectomy  1989   Prior to Admission medications   Medication Sig Start Date End Date Taking? Authorizing Provider  calcium carbonate (OS-CAL) 600 MG TABS tablet Take 1 tablet by mouth daily. 08/31/12  Yes Historical Provider, MD  Cholecalciferol (VITAMIN D3) 1000 units CAPS Take  1,000 Units by mouth daily.   Yes Historical Provider, MD  donepezil (ARICEPT) 10 MG tablet Take 10 mg by mouth at bedtime.  01/02/16  Yes Historical Provider, MD  loratadine (CLARITIN) 10 MG tablet Take 1 tablet by mouth daily as needed for allergies.    Yes Historical Provider, MD  meloxicam (MOBIC) 15 MG tablet Take 1 tablet (15 mg total) by mouth daily. Patient taking differently: Take 15 mg by mouth daily as needed for pain.  04/02/15  Yes Margarita Rana, MD  Acetaminophen 500 MG coapsule Take 1 capsule (500 mg total) by mouth every 4 (four) hours as needed for fever. 07/09/15   Margarita Rana, MD   Allergies  Allergen Reactions  . Galantamine Other (See Comments)     FAMILY HISTORY   Family History  Problem Relation Age of Onset  . Hypertension Mother   . Parkinsonism Mother   . Heart disease Father   . Coronary artery disease Brother   . Heart attack Brother       SOCIAL HISTORY    reports that she has never smoked. She has never used smokeless tobacco. She reports that she does not drink alcohol or use illicit drugs.  Review of Systems  Unable to perform ROS: intubated      VITAL SIGNS    Temp:  [98.2 F (36.8 C)-98.8 F (37.1 C)] 98.2 F (36.8 C) (03/27 0205) Pulse Rate:  [88-116] 95 (03/27 0700) Resp:  [18-23] 21 (03/27 0700) BP: (80-118)/(55-81) 93/63 mmHg (03/27 0700) SpO2:  [94 %-100 %] 100 % (03/27 0747) FiO2 (%):  [40 %] 40 % (03/27 0747) Weight:  [121 lb (54.885 kg)-128  lb 12 oz (58.4 kg)] 128 lb 12 oz (58.4 kg) (03/27 0205) HEMODYNAMICS:   VENTILATOR SETTINGS: Vent Mode:  [-] Spontaneous FiO2 (%):  [40 %] 40 % Set Rate:  [8 bmp-14 bmp] 14 bmp Vt Set:  [450 mL] 450 mL PEEP:  [5 cmH20] 5 cmH20 Pressure Support:  [5 cmH20] 5 cmH20 Plateau Pressure:  [13 cmH20] 13 cmH20 INTAKE / OUTPUT:  Intake/Output Summary (Last 24 hours) at 01/19/16 0810 Last data filed at 01/19/16 0700  Gross per 24 hour  Intake   3725 ml  Output   1015 ml  Net   2710 ml        PHYSICAL EXAM   Physical Exam  Constitutional: She appears well-developed and well-nourished.  HENT:  Head: Normocephalic and atraumatic.  Right Ear: External ear normal.  Left Ear: External ear normal.  Nose: Nose normal.  Mouth/Throat: Oropharynx is clear and moist.  Eyes: Conjunctivae and EOM are normal. Pupils are equal, round, and reactive to light.  Neck: Normal range of motion. Neck supple. No thyromegaly present.  Cardiovascular: Normal rate, regular rhythm, normal heart sounds and intact distal pulses.   No murmur heard. Pulmonary/Chest: Effort normal. She has no wheezes. She has no rales.  Mild shallow BS at the bases.  On the vent, following simple commands  Abdominal: There is tenderness.  Pouch on LLQ, drain in LLQ  Musculoskeletal: Normal range of motion. She exhibits no edema.  Neurological: She is alert.  Skin: Skin is warm and dry. No erythema.  Nursing note and vitals reviewed.      LABS   LABS:  CBC  Recent Labs Lab 01/18/16 1440 01/19/16 0544  WBC 9.1 8.7  HGB 10.4* 9.5*  HCT 30.4* 27.7*  PLT 303 271   Coag's  Recent Labs Lab 01/18/16 2026  APTT 41*  INR 1.37   BMET  Recent Labs Lab 01/18/16 1440 01/19/16 0544  NA 133* 137  K 3.2* 3.1*  CL 101 107  CO2 20* 21*  BUN 36* 24*  CREATININE 1.45* 0.97  GLUCOSE 110* 170*   Electrolytes  Recent Labs Lab 01/18/16 1440 01/19/16 0544  CALCIUM 8.7* 7.4*  MG  --  1.5*  PHOS  --  3.9   Sepsis Markers  Recent Labs Lab 01/18/16 1945 01/19/16 0245  LATICACIDVEN 1.2 2.2*   ABG  Recent Labs Lab 01/19/16 0130  PHART 7.49*  PCO2ART 30*  PO2ART 166*   Liver Enzymes  Recent Labs Lab 01/18/16 1440 01/19/16 0544  AST 18 39  ALT 14 19  ALKPHOS 65 46  BILITOT 1.0 1.1  ALBUMIN 3.2* 2.1*   Cardiac Enzymes No results for input(s): TROPONINI, PROBNP in the last 168 hours. Glucose  Recent Labs Lab 01/19/16 0213  GLUCAP 121*     Recent Results (from the  past 240 hour(s))  MRSA PCR Screening     Status: None   Collection Time: 01/19/16  3:02 AM  Result Value Ref Range Status   MRSA by PCR NEGATIVE NEGATIVE Final    Comment:        The GeneXpert MRSA Assay (FDA approved for NASAL specimens only), is one component of a comprehensive MRSA colonization surveillance program. It is not intended to diagnose MRSA infection nor to guide or monitor treatment for MRSA infections.      Current facility-administered medications:  .  0.9 %  sodium chloride infusion, , Intravenous, Once, Hinda Kehr, MD, Stopped at 01/18/16 2000 .  acetaminophen (OFIRMEV) IV 1,000 mg,  1,000 mg, Intravenous, 4 times per day, Jules Husbands, MD, 1,000 mg at 01/19/16 0535 .  antiseptic oral rinse solution (CORINZ), 7 mL, Mouth Rinse, QID, Rush Farmer, MD .  chlorhexidine gluconate (SAGE KIT) (PERIDEX) 0.12 % solution 15 mL, 15 mL, Mouth Rinse, BID, Rush Farmer, MD .  dextrose 5 % in lactated ringers infusion, , Intravenous, Continuous, Diego F Pabon, MD, Last Rate: 125 mL/hr at 01/19/16 0200 .  diphenhydrAMINE (BENADRYL) 12.5 MG/5ML elixir 12.5 mg, 12.5 mg, Oral, Q6H PRN **OR** diphenhydrAMINE (BENADRYL) injection 12.5 mg, 12.5 mg, Intravenous, Q6H PRN, Diego F Pabon, MD .  fentaNYL (SUBLIMAZE) injection 25-50 mcg, 25-50 mcg, Intravenous, Q2H PRN, Rush Farmer, MD, 50 mcg at 01/19/16 0534 .  heparin injection 5,000 Units, 5,000 Units, Subcutaneous, 3 times per day, Jules Husbands, MD, 5,000 Units at 01/19/16 0541 .  midazolam (VERSED) injection 1-2 mg, 1-2 mg, Intravenous, Q2H PRN, Rush Farmer, MD .  morphine (MORPHINE) 2 mg/mL PCA injection, , Intravenous, 6 times per day, Jules Husbands, MD, Stopped at 01/19/16 0549 .  naloxone [facility]) injection 0.4 mg, 0.4 mg, Intravenous, PRN **AND** sodium chloride flush (NS) 0.9 % injection 9 mL, 9 mL, Intravenous, PRN, Diego F Pabon, MD .  ondansetron (ZOFRAN) injection 4 mg, 4 mg, Intravenous, Q6H PRN, Diego F Pabon,  MD .  [COMPLETED] pantoprazole (PROTONIX) 80 mg in sodium chloride 0.9 % 100 mL IVPB, 80 mg, Intravenous, Once, Jules Husbands, MD, 80 mg at 01/18/16 2157 .  pantoprazole (PROTONIX) injection 40 mg, 40 mg, Intravenous, QHS, Diego F Pabon, MD .  piperacillin-tazobactam (ZOSYN) IVPB 3.375 g, 3.375 g, Intravenous, 3 times per day, Jules Husbands, MD, 3.375 g at 01/19/16 0537  IMAGING    Dg Chest 1 View  01/19/2016  CLINICAL DATA:  Endotracheal tube placement.  Initial encounter. EXAM: CHEST 1 VIEW COMPARISON:  None. FINDINGS: The patient's endotracheal tube is seen ending 3-4 cm above the carina. An enteric tube is noted extending below the diaphragm. The lungs are well-aerated. Peribronchial thickening is noted. Minimal bibasilar atelectasis is noted. There is no evidence of pleural effusion or pneumothorax. The cardiomediastinal silhouette is within normal limits. No acute osseous abnormalities are seen. IMPRESSION: 1. Endotracheal tube seen ending 3-4 cm above the carina. 2. Peribronchial thickening noted. Minimal bibasilar atelectasis noted. Electronically Signed   By: Garald Balding M.D.   On: 01/19/2016 01:55   Ct Abdomen Pelvis W Contrast  01/18/2016  ADDENDUM REPORT: 01/18/2016 19:52 ADDENDUM: These results were called by telephone at the time of interpretation on 01/18/2016 at 7:51 pm to Dr. Hinda Kehr , who verbally acknowledged these results. Electronically Signed   By: Anner Crete M.D.   On: 01/18/2016 19:52  01/18/2016  CLINICAL DATA:  75 year old female with abdominal pain and diarrhea. No surgery. EXAM: CT ABDOMEN AND PELVIS WITH CONTRAST TECHNIQUE: Multidetector CT imaging of the abdomen and pelvis was performed using the standard protocol following bolus administration of intravenous contrast. CONTRAST:  79m ISOVUE-300 IOPAMIDOL (ISOVUE-300) INJECTION 61% COMPARISON:  CT dated 10/09/2007 FINDINGS: Linear bibasilar atelectasis/ scarring. There is large pneumoperitoneum. There is  diffuse stranding of the mesentery and omental fat. Small ascites. Mild apparent fatty infiltration of the liver. A 1 cm hypodense lesion in the left lobe of the liver is not well characterized but likely represents a cyst or hemangioma. The gallbladder is mildly distended. No calcified gallstone identified. The pancreas, spleen, adrenal glands appear unremarkable. There bilateral renal parapelvic cysts.  Multiple smaller renal hypodense lesions are not well characterized but likely represent cysts. There is no hydronephrosis on either side. The visualized ureters and urinary bladder appear unremarkable. Hysterectomy. There is sigmoid diverticulosis with muscular hypertrophy. No active inflammatory changes. There is inflammatory changes and thickening of the body, and antrum of the stomach. Multiple loculated extraluminal air noted in the anterior abdomen and along the greater curvature of the stomach. No extraluminal extravasation of oral contrast identified. There is no evidence of bowel obstruction. The visualized appendix appears unremarkable. There is mild aortoiliac atherosclerotic disease. No portal venous gas identified. There is no adenopathy. There is a small fat containing umbilical hernia. There is mild osteopenia and degenerative changes of the spine. No acute fracture. IMPRESSION: Large amount of pneumoperitoneum most concerning for bowel perforation. The site of presumed bowel perforation is not identified with certainty on the CT. There is thickening of the body and antrum of the stomach. No extraluminal oral contrast identified. Clinical correlation and surgical consult is advised. Sigmoid diverticulosis without active inflammatory changes. Electronically Signed: By: Anner Crete M.D. On: 01/18/2016 19:39      Indwelling Urinary Catheter continued, requirement due to   Reason to continue Indwelling Urinary Catheter for strict Intake/Output monitoring for hemodynamic instability   Central  Line continued, requirement due to   Reason to continue [employer] Monitoring of central venous pressure or other hemodynamic parameters   Ventilator continued, requirement due to, resp failure    Ventilator Sedation RASS 0 to -2   Cultures: BCx2  UC  Sputum  Antibiotics: Zosyn 3/26>>  Lines:   ASSESSMENT/PLAN  75 year old seen in consultation for vent management/free intraperitoneal air status post surgical intervention with exploratory laparotomy, found to have purulent material in the left and right heart colic gutter region.  PULMONARY OETT 7.76m at 22cm A: VDRF status post surgical intervention for exploratory laparotomy and perforated diverticulitis  P:   - Doing well this morning, fully awake, following some commands, extubated to nasal cannula -Mentating O2 saturations greater than 90% -When necessary nebulizers for wheezing and shortness of breath  CARDIOVASCULAR  P:  Continue with hemodynamic monitoring  RENAL Hypokalemia - Electrolytes replacement per ICU protocol -Monitor urine output -Continue with D5 LR, monitor glucose levels  GASTROINTESTINAL A:   Free intraperitoneal air, status post expiratory laparotomy Lysis of thick adhesions Intra-abdominal abscess (right and left paracolic gutter with purulent material during surgical procedure, washed and drained) Hartman's procedure (LLQ ostomy)  P: -Status post surgical intervention see above -Continue with Zosyn for intraperitoneal abscess -FEN per surgery recommendations.  NO use of NGT per surgery for the next 24 hrs - pain management/bowel regiment per surgical recs at this time.   HEMATOLOGIC -No acute issues lying -monitor white cell count -CBC as needed   INFECTIOUS Intraperitoneal abscess status post drainage P:   -during surgical intervention had drainage and washout of right and left paracolic gutter areas.  No cultures obtained -Continue with Zosyn - no fevers or spike in WBC -  if this develops will then pan culture   ENDOCRINE - ICU hypoglycemia/hyperglycemia protocol   NEUROLOGIC Dementia RASS goal: 0 - Aricept on hold until able to use NGT    I have personally obtained a history, examined the patient, evaluated laboratory and imaging results, formulated the assessment and plan and placed orders.  The Patient requires high complexity decision making for assessment and support, frequent evaluation and titration of therapies, application of advanced monitoring technologies and extensive interpretation of multiple  databases. Critical Care Time devoted to patient care services described in this note is 45 minutes.   Overall, patient is critically ill, prognosis is guarded. Patient at high risk for cardiac arrest and death.   Vilinda Boehringer, MD [facility] Pager [phone] (please enter 7-digits) On Call Pager [phone] (please enter 7-digits)   01/19/2016, 8:10 AM  Note: This note was prepared with Dragon dictation along with smaller phrase technology. Any transcriptional errors that result from this process are unintentional.

## 2016-01-19 NOTE — Anesthesia Postprocedure Evaluation (Signed)
Anesthesia Post Note  Patient: Rueben Bashosemarie Z Blankenbeckler  Procedure(s) Performed: Procedure(s) (LRB): EXPLORATORY LAPAROTOMY, LYSIS OF ADHESIONS, DRAINAGE OF INTRABDOMINAL ABSCESS, HARTMAN'S PROCEDURE, UMBILICAL HERNIA REPAIR  (N/A)  Patient location during evaluation: PACU Anesthesia Type: General Level of consciousness: awake and alert Pain management: pain level controlled Vital Signs Assessment: post-procedure vital signs reviewed and stable Respiratory status: patient on ventilator - see flowsheet for VS Cardiovascular status: blood pressure returned to baseline Postop Assessment: no headache Anesthetic complications: no    Last Vitals:  Filed Vitals:   01/19/16 0600 01/19/16 0700  BP: 89/65 93/63  Pulse: 99 95  Temp:    Resp: 18 21    Last Pain:  Filed Vitals:   01/19/16 0751  PainSc: Asleep                 Arnette NorrisFletcher,  Shinichi Anguiano P

## 2016-01-19 NOTE — Progress Notes (Signed)
eLink Physician-Brief Progress Note Patient Name: Rueben BashRosemarie Z Cifuentes DOB: Dec 10, 1940 MRN: [id number]   Date of Service  01/19/2016  HPI/Events of Note  Awake and following commands.  eICU Interventions  PRN sedation until evaluated in AM.     Intervention Category Major Interventions: Other:  Jocsan Mcginley 01/19/2016, 5:23 AM

## 2016-01-19 NOTE — Progress Notes (Signed)
Unable to start morphine PCA, since pt unable to use at this time while on vent.

## 2016-01-19 NOTE — Progress Notes (Signed)
Post op patient slow to wean from vent.  I saw this patient in the pacu initially.  Placed her on vent in hopes that she would be extubated prior to leaving the pacu.  Decision was made to continue to allow patient to rest on vent with plans for extubation in the morning.  Patient was on vent settings VC 450 rr 8 40% peep 5.  abg 7.49 PH pCo2 30 pO2 166 HCO3 22 saturation 98. Transported patient to icu 14 without difficulty.  Placed patient on vent PS 12 peep 5 40%.  Called and spoke to Dr Mikey BussingVan Stavern to update him  and verify vent settings for this patient until her care is assumed by MD covering ICU.  Patient resting comfortably. No distress noted. Vitals stable.

## 2016-01-19 NOTE — Progress Notes (Signed)
Per Dr. Courtney ParisMungal's order she was extubated. She was suctioned prior to extubation for no secretions. After extubation she ws placed on a 2 L nasal cannula. She has a whisper voice and sa02 was 100%.

## 2016-01-19 NOTE — Care Management CHF Note (Signed)
Presented from home with abdominal pain.  Lives with her husband.  Husband is primary caregiver.  Patient has dementia.  Found to have perforated diverticulum requiring surgical intervention.  She is currently intubated and on ventilator.  Current with her PCP and no issues accessing medical care.

## 2016-01-19 NOTE — Progress Notes (Signed)
Discussed with Dr. Orvis BrillLoflin. Patient extubated and improving. Consult cancelled. Re consult if needed.

## 2016-01-19 NOTE — Consult Note (Signed)
MEDICATION RELATED CONSULT NOTE - INITIAL   Pharmacy Consult for electrolyte management Indication: hypokalemia, hypomagnesemia  Allergies  Allergen Reactions  . Galantamine Other (See Comments)    Patient Measurements: Height: 5\' 7"  (170.2 cm) Weight: 128 lb 12 oz (58.4 kg) IBW/kg (Calculated) : 61.6  Vital Signs: Temp: 98.3 F (36.8 C) (03/27 0800) BP: 98/58 mmHg (03/27 1400) Pulse Rate: 88 (03/27 1400) Intake/Output from previous day: 03/26 0701 - 03/27 0700 In: 3725 [I.V.:3625; IV Piggyback:100] Out: 1015 [Urine:770; Drains:145; Blood:100] Intake/Output from this shift: Total I/O In: -  Out: 200 [Urine:200]  Medical History: Past Medical History  Diagnosis Date  . Allergy   . Depression   . Hyperlipidemia   . Anxiety   . Osteoporosis     Assessment: RG is a 75 yo female admitted for bowel perforation. Pharmacy consulted to manage electrolytes in this patient. 3/27: K 3.1, Mag 1.5, Phos 3.9  Plan:  Will give Mag 4g IV x1, patient has also received KCl 10mEq IV x5 due to inability to take oral after bowel surgery. Will recheck K, Mag with AM labs.  Cy BlamerAllison K Akiel Fennell 01/19/2016,3:13 PM

## 2016-01-19 NOTE — Progress Notes (Signed)
75 yr old female POD#1 from Ex Lap, LOA and Hartman's for perorated Sigmoid diverticulitis.  Patient doing well this AM, has been extubated by ICU team.  She will wake up and answer questions.  States she does have some abdominal pain but quickly goes back to sleep.   Filed Vitals:   01/19/16 0600 01/19/16 0700  BP: 89/65 93/63  Pulse: 99 95  Temp:    Resp: 18 21   I/O last 3 completed shifts: In: 3725 [I.V.:3625; IV Piggyback:100] Out: 1015 [Urine:770; Drains:145; Blood:100]    PE:  Gen: NAD, lethargic but awakens easily and answered questions.  Res: CTAB/L  Cardio: RRR Abd: soft, appropriately tender, ostomy pink and patent, bowel sweat in the bag, incision open with wet to dry, clean, no erythema or purulence, JP drains all with serosanguinous drainage GU: foley in place with some green/blue urine Ext: 2+ pulses, no edema  CBC Latest Ref Rng 01/19/2016 01/18/2016 09/11/2013  WBC 3.6 - 11.0 K/uL 8.7 9.1 4.3  Hemoglobin 12.0 - 16.0 g/dL 2.1(H9.5(L) 10.4(L) 11.6(A)  Hematocrit 35.0 - 47.0 % 27.7(L) 30.4(L) 35(A)  Platelets 150 - 440 K/uL 271 303 292   CMP Latest Ref Rng 01/19/2016 01/18/2016 09/11/2013  Glucose 65 - 99 mg/dL 086(V170(H) 784(O110(H) -  BUN 6 - 20 mg/dL 96(E24(H) 95(M36(H) 84(X22(A)  Creatinine 0.44 - 1.00 mg/dL 3.240.97 4.01(U1.45(H) -  Sodium 135 - 145 mmol/L 137 133(L) 144  Potassium 3.5 - 5.1 mmol/L 3.1(L) 3.2(L) 4.6  Chloride 101 - 111 mmol/L 107 101 -  CO2 22 - 32 mmol/L 21(L) 20(L) -  Calcium 8.9 - 10.3 mg/dL 7.4(L) 8.7(L) -  Total Protein 6.5 - 8.1 g/dL 5.2(L) 7.3 -  Total Bilirubin 0.3 - 1.2 mg/dL 1.1 1.0 -  Alkaline Phos 38 - 126 U/L 46 65 -  AST 15 - 41 U/L 39 18 16  ALT 14 - 54 U/L 19 14 10     A/P:  75 yr old female POD#1 from Ex Lap, Hartmans for Perforated Sigmoid diverticulitis  Pain: Tylenol IV scheduled, Toradol IV prn 6 hours, and morphine prn; Agitation: Haldol IV prn for agitation Res: pulmonary toilet, IS Abd: continue wet to dry dressings to midline abdominal wound,  continue JP drains x3, continue Zosyn for peritonitis  GU: continue foley today for strict monitoring of I&Os in ICU setting FEN: continue D5LR IV today, continue NPO and NG tube today PPI: heparin TID and SCDS Physical therapy: today verse tomorrow

## 2016-01-19 NOTE — Progress Notes (Signed)
Called placed to Dr. Everlene FarrierPabon per [facility] request,  [facility], [facility]. [facility].

## 2016-01-19 NOTE — Progress Notes (Signed)
eLink Physician-Brief Progress Note Patient Name: Katherine Wood DOB: 01/21/1941 MRN: [id number]   Date of Service  01/19/2016  HPI/Events of Note  Patient dropped off from PACU on the vent.  Called by surgeon for vent orders.  eICU Interventions  Change from CPAP/PS to Full support mode.  Likely extubation in AM.  Dr. Belia HemanKasa aware.     Intervention Category Major Interventions: Other:  [name] 01/19/2016, 2:48 AM

## 2016-01-19 NOTE — Progress Notes (Signed)
Called [facility] to report lactic acid 2.2.

## 2016-01-19 NOTE — Progress Notes (Signed)
PT Cancellation Note  Patient Details Name: Katherine Wood MRN: [id number] DOB: 26-Jan-1941   Cancelled Treatment:    Reason Eval/Treat Not Completed: Medical issues which prohibited therapy (Consult received and chart review.  Order placed for start date 01/20/16.  Per notes, patient currently intubated/sedated with plans for possible extubation this AM.  Will hold at this time and maintain initial start date 01/19/16 to ensure respiratory stability post-extubation prior to initiation of physical activity.)   Katherine Wood, PT, DPT, NCS 01/19/2016, 7:46 AM [phone]

## 2016-01-20 DIAGNOSIS — K5669 Other intestinal obstruction: Secondary | ICD-10-CM

## 2016-01-20 DIAGNOSIS — K651 Peritoneal abscess: Secondary | ICD-10-CM

## 2016-01-20 LAB — BASIC METABOLIC PANEL
ANION GAP: 5 (ref 5–15)
BUN: 20 mg/dL (ref 6–20)
CALCIUM: 7.7 mg/dL — AB (ref 8.9–10.3)
CO2: 22 mmol/L (ref 22–32)
Chloride: 110 mmol/L (ref 101–111)
Creatinine, Ser: 1.07 mg/dL — ABNORMAL HIGH (ref 0.44–1.00)
GFR, EST AFRICAN AMERICAN: 58 mL/min — AB (ref >60)
GFR, EST NON AFRICAN AMERICAN: 50 mL/min — AB (ref >60)
Glucose, Bld: 126 mg/dL — ABNORMAL HIGH (ref 65–99)
POTASSIUM: 3.6 mmol/L (ref 3.5–5.1)
SODIUM: 137 mmol/L (ref 135–145)

## 2016-01-20 LAB — TYPE AND SCREEN
ABO/RH(D): A POS
ANTIBODY SCREEN: NEGATIVE
UNIT DIVISION: 0
UNIT DIVISION: 0

## 2016-01-20 LAB — MAGNESIUM: MAGNESIUM: 2.3 mg/dL (ref 1.7–2.4)

## 2016-01-20 LAB — SURGICAL PATHOLOGY

## 2016-01-20 MED ORDER — ACETAMINOPHEN 10 MG/ML IV SOLN
1000.0000 mg | Freq: Four times a day (QID) | INTRAVENOUS | Status: AC
  Administered 2016-01-20 – 2016-01-21 (×4): 1000 mg via INTRAVENOUS
  Filled 2016-01-20 (×4): qty 100

## 2016-01-20 MED ORDER — POTASSIUM CHLORIDE 10 MEQ/100ML IV SOLN
10.0000 meq | INTRAVENOUS | Status: AC
  Administered 2016-01-20 (×3): 10 meq via INTRAVENOUS
  Filled 2016-01-20 (×3): qty 100

## 2016-01-20 NOTE — Consult Note (Signed)
MEDICATION RELATED CONSULT NOTE - INITIAL   Pharmacy Consult for electrolyte management Indication: hypokalemia, hypomagnesemia  Allergies  Allergen Reactions  . Galantamine Other (See Comments)    Patient Measurements: Height: 5\' 7"  (170.2 cm) Weight: 128 lb 12 oz (58.4 kg) IBW/kg (Calculated) : 61.6  Vital Signs: Temp: 98.4 F (36.9 C) (03/28 1200) BP: 117/64 mmHg (03/28 1200) Pulse Rate: 98 (03/28 1300) Intake/Output from previous day: 03/27 0701 - 03/28 0700 In: 3675 [I.V.:2875; IV Piggyback:800] Out: 1505 [Urine:1400; Drains:105] Intake/Output from this shift: Total I/O In: 825 [I.V.:725; IV Piggyback:100] Out: 109 [Urine:80; Drains:29]  Medical History: Past Medical History  Diagnosis Date  . Allergy   . Depression   . Hyperlipidemia   . Anxiety   . Osteoporosis     Assessment: RG is a 75 yo female admitted for bowel perforation. Pharmacy consulted to manage electrolytes in this patient. All electrolytes WNL  Plan:  Will recheck with AM labs  Roque CashAllison Ernisha Sorn, PharmD Pharmacy Resident 01/20/2016

## 2016-01-20 NOTE — Progress Notes (Signed)
75 yr old female POD#2 from Ex Lap, LOA and Hartman's for perorated Sigmoid diverticulitis.  Patient doing well this AM.  States that she is in some pain but cannot remember if shes gotten pain medication.  She is not having any nausea and has been moving herself in bed.  She has been doing the IS every hour as reminded by nursing staff.    Filed Vitals:   01/20/16 1200 01/20/16 1300  BP: 117/64   Pulse: 87 98  Temp: 98.4 F (36.9 C)   Resp: 22    I/O last 3 completed shifts: In: 7400 [I.V.:6500; IV Piggyback:900] Out: 2520 [Urine:2170; Drains:250; Blood:100] Total I/O In: 825 [I.V.:725; IV Piggyback:100] Out: 109 [Urine:80; Drains:29]  PE:  Gen: NAD,awake and conversant  Res: CTAB/L  Cardio: RRR Abd: soft, appropriately tender, ostomy pink and patent, bowel sweat in the bag, incision open with wet to dry, clean, good granulation tissue, no erythema or purulence, JP drains all with serosanguinous drainage GU: foley in place with some green/blue urine Ext: 2+ pulses, no edema  CBC Latest Ref Rng 01/19/2016 01/18/2016 09/11/2013  WBC 3.6 - 11.0 K/uL 8.7 9.1 4.3  Hemoglobin 12.0 - 16.0 g/dL 1.6(X9.5(L) 10.4(L) 11.6(A)  Hematocrit 35.0 - 47.0 % 27.7(L) 30.4(L) 35(A)  Platelets 150 - 440 K/uL 271 303 292   CMP Latest Ref Rng 01/20/2016 01/19/2016 01/19/2016  Glucose 65 - 99 mg/dL 096(E126(H) - 454(U170(H)  BUN 6 - 20 mg/dL 20 - 98(J24(H)  Creatinine 0.44 - 1.00 mg/dL 1.91(Y1.07(H) - 7.820.97  Sodium 135 - 145 mmol/L 137 - 137  Potassium 3.5 - 5.1 mmol/L 3.6 3.3(L) 3.1(L)  Chloride 101 - 111 mmol/L 110 - 107  CO2 22 - 32 mmol/L 22 - 21(L)  Calcium 8.9 - 10.3 mg/dL 7.7(L) - 7.4(L)  Total Protein 6.5 - 8.1 g/dL - - 5.2(L)  Total Bilirubin 0.3 - 1.2 mg/dL - - 1.1  Alkaline Phos 38 - 126 U/L - - 46  AST 15 - 41 U/L - - 39  ALT 14 - 54 U/L - - 19    A/P:  75 yr old female POD#2 from Ex Lap, Hartmans for Perforated Sigmoid diverticulitis  Pain: Tylenol IV scheduled, Toradol IV prn 6 hours, and morphine prn;  Agitation: Haldol IV prn for agitation Res: pulmonary toilet, IS Abd: continue wet to dry dressings to midline abdominal wound, continue JP drains x3, continue Zosyn for peritonitis  NF:[id number]:discontinue foley today FEN: continue D5LR IV today, will d/c NG tube today, may have ice chips  PPI: heparin TID and SCDS Physical therapy: to ambulate today Disp: tx to floor

## 2016-01-20 NOTE — Evaluation (Signed)
Physical Therapy Evaluation Patient Details Name: Katherine Wood MRN: [id number] DOB: [birth year] Today's Date: 01/20/2016   History of Present Illness  75 yo F presented to ED due to abdominal pain and diarrhea, found to have a bowel perforation, hernia of abdominal cavity and diverticulitis. PMH includes depression, anxiety, osteoporosis, and HLD. She has an exploratory laparotomy on 3/26 with repairs and colostomy bag placement. She was extubated on 3/27.  Clinical Impression  Pt demonstrated generalized weakness with moderate mobility deficits due to recent medical complexities and hospitalization. She required mod A for bed mobility with use of rail with difficulty due to abdominal pain. Pt performed STS at EOB with FWW and min A. Her standing tolerance is limited to less than 30 seconds. Vitals monitored frequently with HR ranged 96 to 99 and SpO2 98 to 100% on RA. Ambulation deferred today due to pt's moderate abdominal pain. Due to decreased strength, activity tolerance and functional mobility STR is recommended after hospitalization to progress pt towards PLOF for a safe return home. Pt will benefit from skilled PT services to increase functional I and mobility.     Follow Up Recommendations SNF    Equipment Recommendations  Rolling walker with 5" wheels    Recommendations for Other Services       Precautions / Restrictions Precautions Precautions: Fall Restrictions Weight Bearing Restrictions: No      Mobility  Bed Mobility Overal bed mobility: Needs Assistance Bed Mobility: Supine to Sit;Sit to Supine     Supine to sit: Mod assist Sit to supine: Mod assist   General bed mobility comments: uses rail, difficulty getting trunk upright due to abdominal pain  Transfers Overall transfer level: Needs assistance Equipment used: Rolling walker (2 wheeled) Transfers: Sit to/from Stand Sit to Stand: Min assist;From elevated surface         General transfer comment:  cues for hand placement; difficulty due to abdominal pain  Ambulation/Gait             General Gait Details: NT at this time due to pt's moderate abdominal pain  Stairs            Wheelchair Mobility    Modified Rankin (Stroke Patients Only)       Balance Overall balance assessment: Needs assistance Sitting-balance support: Bilateral upper extremity supported Sitting balance-Leahy Scale: Fair Sitting balance - Comments: decreased trunk stability with abdominal pain   Standing balance support: Bilateral upper extremity supported Standing balance-Leahy Scale: Poor Standing balance comment: difficulty standing fully upright, standing tolerance limited by weakness, fatigue and abdominal pain                             Pertinent Vitals/Pain Pain Assessment: 0-10 Pain Score: 8  Pain Location: abdomin Pain Descriptors / Indicators: Aching Pain Intervention(s): Limited activity within patient's tolerance;Monitored during session;Premedicated before session    Home Living Family/patient expects to be discharged to:: Private residence Living Arrangements: Spouse/significant other Available Help at Discharge: Family Type of Home: House Home Access: Stairs to enter Entrance Stairs-Rails: None Entrance Stairs-Number of Steps: 2 Home Layout: Two level;Bed/bath upstairs;Full bath on main level Home Equipment: None      Prior Function Level of Independence: Independent         Comments: Pt reported being I with ADLs, ambulation and driving. She was walking her dogs daily. Husband assists with household duties.     Hand Dominance  Extremity/Trunk Assessment   Upper Extremity Assessment: Generalized weakness           Lower Extremity Assessment: Generalized weakness (grossly 4-/5)         Communication   Communication: No difficulties  Cognition Arousal/Alertness: Awake/alert Behavior During Therapy: WFL for tasks  assessed/performed Overall Cognitive Status: Within Functional Limits for tasks assessed                      General Comments General comments (skin integrity, edema, etc.): B JP drains (2 on R side), colostomy bag    Exercises Other Exercises Other Exercises: B LE supine therex: ankle pumps, QS, heel slides, hip abd slides with AAROM x10 each. Cues for proper technique. Therapeutic rest breaks for energy conservaiton.      Assessment/Plan    PT Assessment Patient needs continued PT services  PT Diagnosis Difficulty walking;Generalized weakness   PT Problem List Decreased strength;Decreased activity tolerance;Decreased balance;Decreased mobility;Decreased knowledge of use of DME;Cardiopulmonary status limiting activity;Decreased skin integrity;Pain  PT Treatment Interventions DME instruction;Gait training;Stair training;Therapeutic activities;Therapeutic exercise;Balance training;Neuromuscular re-education;Patient/family education   PT Goals (Current goals can be found in the Care Plan section) Acute Rehab PT Goals Patient Stated Goal: to do whatever it takes PT Goal Formulation: With patient Time For Goal Achievement: 02/03/16 Potential to Achieve Goals: Good    Frequency Min 2X/week   Barriers to discharge Inaccessible home environment;Decreased caregiver support steps to enter, 12 steps upstairs to bedroom and BR    Co-evaluation               End of Session   Activity Tolerance: Patient limited by fatigue;Patient limited by pain Patient left: in bed;with call bell/phone within reach;with family/visitor present Nurse Communication: Mobility status         Time: 1132-1203 PT Time Calculation (min) (ACUTE ONLY): 31 min   Charges:   PT Evaluation $PT Eval Moderate Complexity: 1 Procedure PT Treatments $Therapeutic Exercise: 8-22 mins   PT G Codes:        Adelene Idler, PT, DPT  01/20/2016, 1:31 PM [phone]

## 2016-01-20 NOTE — Consult Note (Signed)
WOC ostomy consult note Stoma type/location: LLQ COlostomy Stomal assessment/size: 1 1/2" edematous, pink patent.  Blood tinged liquid only in the pouch at this time.  Peristomal assessment: Will add barrier ring due to flush stoma.  Advised spouse at bedside that the stoma may change in size and appearance and measuring will be necessary for the next six weeks.  Treatment options for stomal/peristomal skin: Barrier ring Output blood tinged liquid Ostomy pouching: 2pc. 2 1/4" pouch with barrier ring  Education provided: Spouse at bedside.  Pouch is changed.  Explained that pouch will be emptied when 1/3 full and pouch changed twice weekly.  Verbalizes understanding. Patient is intermittently confused.  Enrolled patient in [employer] DC program: No WOC team will follow and remain available to patient medical and nursing teams.  Maple HudsonKaren Sarae Nicholes RN BSN CWON Pager [phone]

## 2016-01-20 NOTE — Progress Notes (Signed)
PULMONARY / CRITICAL CARE MEDICINE   Name: Katherine Wood MRN: [id number] DOB: [birth year]    ADMISSION DATE:  01/18/2016  BRIEF HISTORY: 75 year old female brought to the hospital on 01/18/2016 for lower abdominal pain 3 days, sharp, intermittent, constant, CT scan found to have intraperitoneal free air. Urgently taken for surgical intervention, now status post exploratory laparotomy with findings of lysis of thick adhesions, Hartman's procedure, repair of umbilical hernia, placement of plate drains 3, drainage intra-abdominal abscess (purulent material in the left pericolic and right paracolic gutter). Extubated on 3/27  SUBJECTIVE:  Extubated on 3/27, doing well this morning, able to vocalize needs. States she has some mild pain around surgical site, otherwise no other complaints.   VITAL SIGNS: Temp:  [97.5 F (36.4 C)-97.7 F (36.5 C)] 97.7 F (36.5 C) (03/28 0230) Pulse Rate:  [72-97] 92 (03/28 0800) Resp:  [15-26] 20 (03/28 0800) BP: (88-113)/(51-83) 105/57 mmHg (03/28 0800) SpO2:  [96 %-100 %] 97 % (03/28 0800) HEMODYNAMICS:   VENTILATOR SETTINGS:   INTAKE / OUTPUT:  Intake/Output Summary (Last 24 hours) at 01/20/16 0924 Last data filed at 01/20/16 0600  Gross per 24 hour  Intake   3675 ml  Output   1505 ml  Net   2170 ml    Review of Systems  Constitutional: Negative for fever, chills, weight loss and malaise/fatigue.  Eyes: Negative for blurred vision and double vision.  Respiratory: Negative for cough, hemoptysis, sputum production, shortness of breath and wheezing.   Cardiovascular: Negative for chest pain.  Gastrointestinal: Positive for abdominal pain. Negative for heartburn, nausea and vomiting.  Genitourinary: Negative for dysuria.  Musculoskeletal: Negative for myalgias.  Skin: Negative for rash.  Neurological: Negative for dizziness and headaches.  Endo/Heme/Allergies: Does not bruise/bleed easily.  Psychiatric/Behavioral: Negative for  depression.    Physical Exam  Constitutional: She is well-developed, well-nourished, and in no distress.  HENT:  Head: Normocephalic and atraumatic.  Right Ear: External ear normal.  Left Ear: External ear normal.  Nose: Nose normal.  Mouth/Throat: Oropharynx is clear and moist.  Eyes: Conjunctivae and EOM are normal. Pupils are equal, round, and reactive to light.  Neck: Normal range of motion. Neck supple.  Cardiovascular: Normal rate, regular rhythm, normal heart sounds and intact distal pulses.   Abdominal: She exhibits no distension.  Abdominal bandage around surgical site, no purulent drainage.  Musculoskeletal: Normal range of motion. She exhibits no edema.  Neurological: She is alert.  Skin: Skin is warm and dry. No erythema.  Nursing note and vitals reviewed.    LABS:  CBC  Recent Labs Lab 01/18/16 1440 01/19/16 0544  WBC 9.1 8.7  HGB 10.4* 9.5*  HCT 30.4* 27.7*  PLT 303 271   Coag's  Recent Labs Lab 01/18/16 2026  APTT 41*  INR 1.37   BMET  Recent Labs Lab 01/18/16 1440 01/19/16 0544 01/19/16 1844  NA 133* 137  --   K 3.2* 3.1* 3.3*  CL 101 107  --   CO2 20* 21*  --   BUN 36* 24*  --   CREATININE 1.45* 0.97  --   GLUCOSE 110* 170*  --    Electrolytes  Recent Labs Lab 01/18/16 1440 01/19/16 0544 01/19/16 1844  CALCIUM 8.7* 7.4*  --   MG  --  1.5* 2.9*  PHOS  --  3.9  --    Sepsis Markers  Recent Labs Lab 01/18/16 1945 01/19/16 0245  LATICACIDVEN 1.2 2.2*   ABG  Recent Labs Lab 01/19/16 0130  PHART 7.49*  PCO2ART 30*  PO2ART 166*   Liver Enzymes  Recent Labs Lab 01/18/16 1440 01/19/16 0544  AST 18 39  ALT 14 19  ALKPHOS 65 46  BILITOT 1.0 1.1  ALBUMIN 3.2* 2.1*   Cardiac Enzymes No results for input(s): TROPONINI, PROBNP in the last 168 hours. Glucose  Recent Labs Lab 01/19/16 0213  GLUCAP 121*    Imaging No results found.  Cultures: BCx2  UC  Sputum  Antibiotics: Zosyn  3/26>>  Lines:  ASSESSMENT / PLAN: 75 year old seen in consultation for vent management/free intraperitoneal air status post surgical intervention with exploratory laparotomy, found to have purulent material in the left and right heart colic gutter region.  PULMONARY OETT 7.43mm at 22cm, extubated 01/20/2016 A: VDRF status post surgical intervention for exploratory laparotomy and perforated diverticulitis now extubated  P:  - Doing well this morning, fully awake,  -Maintain O2 saturations greater than 90% -When necessary nebulizers for wheezing and shortness of breath  CARDIOVASCULAR  P:  Continue with hemodynamic monitoring  RENAL Hypokalemia - Electrolytes replacement per ICU protocol -Monitor urine output -Continue with D5 LR, monitor glucose levels  GASTROINTESTINAL A:  Free intraperitoneal air, status post expiratory laparotomy Lysis of thick adhesions Intra-abdominal abscess (right and left paracolic gutter with purulent material during surgical procedure, washed and drained) Hartman's procedure (LLQ ostomy)  P: -Status post surgical intervention see above -Continue with Zosyn for intraperitoneal abscess -FEN per surgery recommendations. NO use of NGT per surgery for the next 24 hrs - pain management/bowel regiment per surgical recs at this time.   HEMATOLOGIC -No acute issues lying -monitor white cell count -CBC as needed   INFECTIOUS Intraperitoneal abscess status post drainage P:  -during surgical intervention had drainage and washout of right and left paracolic gutter areas. No cultures obtained -Continue with Zosyn - no fevers or spike in WBC - if this develops will then pan culture   ENDOCRINE - ICU hypoglycemia/hyperglycemia protocol   NEUROLOGIC Dementia RASS goal: 0 - Aricept on hold until able to use NGT   Patient currently stable, and will be transferred out the ICU today.   Thank you for consulting [facility], we will signoff at this time.  Please feel free to contact us with any questions at [phone] (please enter 7-digits).  I have personally obtained a history, examined the patient, evaluated laboratory and imaging results, formulated the assessment and plan and placed orders.  The Patient requires high complexity decision making for assessment and support, frequent evaluation and titration of therapies, application of advanced monitoring technologies and extensive interpretation of multiple databases. Critical Care Time devoted to patient care services described in this note is 35 minutes.      Stephanie Acre, MD [facility] Pager [phone] (please enter 7-digits) On Call Pager - [phone] (please enter 7-digits)  Note: This note was prepared with Dragon dictation along with smaller phrase technology. Any transcriptional errors that result from this process are unintentional.

## 2016-01-20 NOTE — Consult Note (Signed)
MEDICATION RELATED CONSULT NOTE - INITIAL   Pharmacy Consult for electrolyte management Indication: hypokalemia, hypomagnesemia  Allergies  Allergen Reactions  . Galantamine Other (See Comments)    Patient Measurements: Height: 5\' 7"  (170.2 cm) Weight: 128 lb 12 oz (58.4 kg) IBW/kg (Calculated) : 61.6  Vital Signs: Temp: 97.7 F (36.5 C) (03/28 0230) Temp Source: Oral (03/28 0230) BP: 98/56 mmHg (03/28 0300) Pulse Rate: 75 (03/28 0300) Intake/Output from previous day: 03/27 0701 - 03/28 0700 In: 2950 [I.V.:2500; IV Piggyback:450] Out: 1195 [Urine:1150; Drains:45] Intake/Output from this shift: Total I/O In: 1150 [I.V.:1000; IV Piggyback:150] Out: 550 [Urine:550]  Medical History: Past Medical History  Diagnosis Date  . Allergy   . Depression   . Hyperlipidemia   . Anxiety   . Osteoporosis     Assessment: RG is a 75 yo female admitted for bowel perforation. Pharmacy consulted to manage electrolytes in this patient. 3/27: K 3.1, Mag 1.5, Phos 3.9  Plan:  RN called to let us know K 3.3 this evening after 5 runs of KCl. Ordered 10 mEq IV Q1H x 3 doses and adjusted lab times to 0900.   Carola FrostNathan A Abdulloh Ullom, Pharm.D., BCPS Clinical Pharmacist 01/20/2016,3:47 AM

## 2016-01-21 LAB — MAGNESIUM
Magnesium: 1.6 mg/dL — ABNORMAL LOW (ref 1.7–2.4)
Magnesium: 2.1 mg/dL (ref 1.7–2.4)

## 2016-01-21 LAB — BASIC METABOLIC PANEL
Anion gap: 6 (ref 5–15)
BUN: 14 mg/dL (ref 6–20)
CO2: 23 mmol/L (ref 22–32)
Calcium: 7.4 mg/dL — ABNORMAL LOW (ref 8.9–10.3)
Chloride: 108 mmol/L (ref 101–111)
Creatinine, Ser: 0.9 mg/dL (ref 0.44–1.00)
GFR calc Af Amer: >60 mL/min (ref >60)
Glucose, Bld: 115 mg/dL — ABNORMAL HIGH (ref 65–99)
POTASSIUM: 3 mmol/L — AB (ref 3.5–5.1)
Sodium: 137 mmol/L (ref 135–145)

## 2016-01-21 LAB — POTASSIUM: Potassium: 3.6 mmol/L (ref 3.5–5.1)

## 2016-01-21 MED ORDER — POTASSIUM CHLORIDE 10 MEQ/100ML IV SOLN
10.0000 meq | INTRAVENOUS | Status: DC
  Filled 2016-01-21 (×6): qty 100

## 2016-01-21 MED ORDER — LACTATED RINGERS IV BOLUS (SEPSIS)
500.0000 mL | Freq: Once | INTRAVENOUS | Status: AC
  Administered 2016-01-21: 500 mL via INTRAVENOUS

## 2016-01-21 MED ORDER — ACETAMINOPHEN 325 MG PO TABS
650.0000 mg | ORAL_TABLET | Freq: Once | ORAL | Status: AC
  Administered 2016-01-21: 650 mg via ORAL
  Filled 2016-01-21: qty 2

## 2016-01-21 MED ORDER — POTASSIUM CHLORIDE 10 MEQ/100ML IV SOLN
10.0000 meq | INTRAVENOUS | Status: AC
  Administered 2016-01-21 – 2016-01-22 (×6): 10 meq via INTRAVENOUS
  Filled 2016-01-21 (×6): qty 100

## 2016-01-21 MED ORDER — POTASSIUM CHLORIDE CRYS ER 20 MEQ PO TBCR: 40.0000 meq | EXTENDED_RELEASE_TABLET | Freq: Two times a day (BID) | ORAL | Status: DC

## 2016-01-21 MED ORDER — DONEPEZIL HCL 5 MG PO TABS
10.0000 mg | ORAL_TABLET | Freq: Every day | ORAL | Status: DC
  Administered 2016-01-21 – 2016-01-30 (×10): 10 mg via ORAL
  Filled 2016-01-21 (×11): qty 2

## 2016-01-21 MED ORDER — MAGNESIUM SULFATE 4 GM/100ML IV SOLN
4.0000 g | Freq: Once | INTRAVENOUS | Status: AC
  Administered 2016-01-21: 4 g via INTRAVENOUS
  Filled 2016-01-21: qty 100

## 2016-01-21 NOTE — Consult Note (Signed)
MEDICATION RELATED CONSULT NOTE - INITIAL   Pharmacy Consult for electrolyte management Indication: hypokalemia, hypomagnesemia  Allergies  Allergen Reactions  . Galantamine Other (See Comments)    Patient Measurements: Height: 5\' 7"  (170.2 cm) Weight: 128 lb 12 oz (58.4 kg) IBW/kg (Calculated) : 61.6  Vital Signs: Temp: 98.3 F (36.8 C) (03/29 0723) Temp Source: Oral (03/29 0723) BP: 111/58 mmHg (03/29 0723) Pulse Rate: 75 (03/29 0723) Intake/Output from previous day: 03/28 0701 - 03/29 0700 In: 3385 [I.V.:2950; IV Piggyback:400] Out: 692 [Urine:630; Drains:62] Intake/Output from this shift: Total I/O In: 250 [I.V.:250] Out: -   Medical History: Past Medical History  Diagnosis Date  . Allergy   . Depression   . Hyperlipidemia   . Anxiety   . Osteoporosis     Assessment: RG is a 75 yo female admitted for bowel perforation. Pharmacy consulted to manage electrolytes in this patient. 3/29 K 3.0, Mag 1.6  Plan:  Will give KCl 10mEq IV x6 as patient is still NPO after surgery. Will also give Magnesium 4g IV x1.   Will recheck K and Mag at 1800.   Roque CashAllison Uriyah Raska, PharmD Pharmacy Resident 01/21/2016

## 2016-01-21 NOTE — Progress Notes (Signed)
Pt febrile with temp of 101.17F and HR sustaining in the 130's-140's.  BT 123/56.  Pt exhibiting no s/s of distress.    Dr. Excell Seltzerooper informed and orders given for  500 mL Bolus LR PO 650 mg Tylenol.  Will follow out orders, continue to monitor pt, and notify MD of any changes.

## 2016-01-21 NOTE — Progress Notes (Signed)
Pt oob to chair, 2 person assist

## 2016-01-21 NOTE — Progress Notes (Signed)
Patient resting intermittently  Overnight, confused to place and situation. States "I feel so stupid". Patient reassured and reoriented throughout the night. Haldol 2mg  given IVP x1 to promote rest and decreased anxiety with improvement.  JP drains x3 remain in place with small amt of drainage.  Abdominal dressing changed/MD order, incision clean and dry redressed, patient tolerated without complaints of pain.  Vital signs stable see CH[facility] for further update.

## 2016-01-21 NOTE — Progress Notes (Signed)
3 Days Post-Op  Subjective: Feels well and is stable and is scheduled to be discharged to the floor from the ICU later today. Nausea or vomiting.  Objective: Vital signs in last 24 hours: Temp:  [97.5 F (36.4 C)-98.5 F (36.9 C)] 97.7 F (36.5 C) (03/29 1016) Pulse Rate:  [75-99] 75 (03/29 0723) Resp:  [16-27] 24 (03/29 1016) BP: (103-131)/(58-77) 119/73 mmHg (03/29 1016) SpO2:  [87 %-99 %] 97 % (03/29 1016) Last BM Date: 01/16/16  Intake/Output from previous day: 03/28 0701 - 03/29 0700 In: 3385 [I.V.:2950; IV Piggyback:400] Out: 692 [Urine:630; Drains:62] Intake/Output this shift: Total I/O In: 450 [I.V.:250; IV Piggyback:200] Out: 200 [Urine:200]  Physical exam:  Wound is clean no erythema no drainage no ostomy output but the ostomy is viable.  Lab Results: CBC   Recent Labs  01/18/16 1440 01/19/16 0544  WBC 9.1 8.7  HGB 10.4* 9.5*  HCT 30.4* 27.7*  PLT 303 271   BMET  Recent Labs  01/20/16 0852 01/21/16 0556  NA 137 137  K 3.6 3.0*  CL 110 108  CO2 22 23  GLUCOSE 126* 115*  BUN 20 14  CREATININE 1.07* 0.90  CALCIUM 7.7* 7.4*   PT/INR  Recent Labs  01/18/16 2026  LABPROT 17.0*  INR 1.37   ABG  Recent Labs  01/19/16 0130  PHART 7.49*  HCO3 22.9    Studies/Results: No results found.  Anti-infectives: Anti-infectives    Start     Dose/Rate Route Frequency Ordered Stop   01/19/16 0600  ciprofloxacin (CIPRO) IVPB 400 mg  Status:  Discontinued     400 mg 200 mL/hr over 60 Minutes Intravenous On call to O.R. 01/18/16 2122 01/19/16 0208   01/19/16 0600  metroNIDAZOLE (FLAGYL) IVPB 500 mg  Status:  Discontinued     500 mg 100 mL/hr over 60 Minutes Intravenous On call to O.R. 01/18/16 2122 01/19/16 0208   01/19/16 0600  piperacillin-tazobactam (ZOSYN) IVPB 3.375 g     3.375 g 12.5 mL/hr over 240 Minutes Intravenous 3 times per day 01/19/16 0251     01/18/16 1945  piperacillin-tazobactam (ZOSYN) IVPB 3.375 g     3.375 g 12.5 mL/hr  over 240 Minutes Intravenous STAT 01/18/16 1934 01/18/16 2041   01/18/16 1942  piperacillin-tazobactam (ZOSYN) 3.375 GM/50ML IVPB    Comments:  ORE, HUNTER: cabinet override      01/18/16 1942 01/19/16 0744      Assessment/Plan: s/p Procedure(s): EXPLORATORY LAPAROTOMY, LYSIS OF ADHESIONS, DRAINAGE OF INTRABDOMINAL ABSCESS, HARTMAN'S PROCEDURE, UMBILICAL HERNIA REPAIR    Labs are reviewed. We will advance to clear liquids and repeat labs in the morning. Would not advance past clears until her ostomy is functional.*  Lattie Hawichard E Arlind Klingerman, MD, FACS  01/21/2016

## 2016-01-21 NOTE — Consult Note (Signed)
WOC ostomy follow up Stoma type/location: LLQ colostomy Stomal assessment/size: 1 1/2" flush pink stoma pouch intact from yesterday Peristomal assessment: not assessed Treatment options for stomal/peristomal skin: Barrier ring Output Blood tinged liquid Ostomy pouching: 2pc. 2 1/4" pouch with barrier ring  Education provided: Spouse at bedside.  Discussed written materials.  Understands frequency of pouch changes, emptying.  Will change pouch again tomorrow.  To be moved to the unit today.  [place] [facility] [place] Dis[place]: No WOC team will follow.  Maple HudsonKaren Shaketa Serafin RN BSN CWON Pager [phone]

## 2016-01-22 LAB — CBC WITH DIFFERENTIAL/PLATELET
BASOS PCT: 0 %
Basophils Absolute: 0 10*3/uL (ref 0–0.1)
Eosinophils Absolute: 0 10*3/uL (ref 0–0.7)
Eosinophils Relative: 0 %
HEMATOCRIT: 23.5 % — AB (ref 35.0–47.0)
HEMOGLOBIN: 7.9 g/dL — AB (ref 12.0–16.0)
LYMPHS ABS: 0.6 10*3/uL — AB (ref 1.0–3.6)
LYMPHS PCT: 7 %
MCH: 31.7 pg (ref 26.0–34.0)
MCHC: 33.5 g/dL (ref 32.0–36.0)
MCV: 94.8 fL (ref 80.0–100.0)
MONOS PCT: 6 %
Monocytes Absolute: 0.5 10*3/uL (ref 0.2–0.9)
NEUTROS ABS: 7.4 10*3/uL — AB (ref 1.4–6.5)
NEUTROS PCT: 87 %
Platelets: 305 10*3/uL (ref 150–440)
RBC: 2.48 MIL/uL — ABNORMAL LOW (ref 3.80–5.20)
RDW: 14.7 % — ABNORMAL HIGH (ref 11.5–14.5)
WBC: 8.6 10*3/uL (ref 3.6–11.0)

## 2016-01-22 LAB — BASIC METABOLIC PANEL
Anion gap: 6 (ref 5–15)
BUN: 12 mg/dL (ref 6–20)
CHLORIDE: 110 mmol/L (ref 101–111)
CO2: 23 mmol/L (ref 22–32)
CREATININE: 0.88 mg/dL (ref 0.44–1.00)
Calcium: 7.6 mg/dL — ABNORMAL LOW (ref 8.9–10.3)
GFR calc non Af Amer: >60 mL/min (ref >60)
Glucose, Bld: 125 mg/dL — ABNORMAL HIGH (ref 65–99)
Potassium: 4 mmol/L (ref 3.5–5.1)
Sodium: 139 mmol/L (ref 135–145)

## 2016-01-22 MED ORDER — METOPROLOL TARTRATE 25 MG PO TABS
12.5000 mg | ORAL_TABLET | Freq: Two times a day (BID) | ORAL | Status: DC
  Administered 2016-01-22 – 2016-02-12 (×42): 12.5 mg via ORAL
  Administered 2016-02-13: 25 mg via ORAL
  Administered 2016-02-13 – 2016-02-14 (×2): 12.5 mg via ORAL
  Filled 2016-01-22 (×46): qty 1

## 2016-01-22 MED ORDER — LACTATED RINGERS IV BOLUS (SEPSIS)
500.0000 mL | Freq: Once | INTRAVENOUS | Status: AC
  Administered 2016-01-22: 500 mL via INTRAVENOUS

## 2016-01-22 NOTE — Progress Notes (Signed)
Notified MD concerning patient's elevated HR between 120-140s after exertion. MD acknowledged and stated he will place new orders.

## 2016-01-22 NOTE — Progress Notes (Signed)
4 Days Post-Op  Subjective: Patient feels well today is starting to have some stool output in her ostomy bag. This tolerating clear liquid diet  Objective: Vital signs in last 24 hours: Temp:  [97.6 F (36.4 C)-101.7 F (38.7 C)] 99.5 F (37.5 C) (03/30 0618) Pulse Rate:  [107-131] 114 (03/30 0618) Resp:  [18-24] 20 (03/30 0618) BP: (106-123)/(56-73) 116/64 mmHg (03/30 0618) SpO2:  [95 %-98 %] 95 % (03/30 0618) Last BM Date: 01/16/16  Intake/Output from previous day: 03/29 0701 - 03/30 0700 In: 2111.7 [P.O.:40; I.V.:1731.7; IV Piggyback:300] Out: 510 [Urine:510] Intake/Output this shift: Total I/O In: 427.1 [I.V.:427.1] Out: -   Physical exam:  Wound is clean ostomy output present abdomen nontender  Lab Results: CBC   Recent Labs  01/22/16 0522  WBC 8.6  HGB 7.9*  HCT 23.5*  PLT 305   BMET  Recent Labs  01/21/16 0556 01/21/16 1933 01/22/16 0522  NA 137  --  139  K 3.0* 3.6 4.0  CL 108  --  110  CO2 23  --  23  GLUCOSE 115*  --  125*  BUN 14  --  12  CREATININE 0.90  --  0.88  CALCIUM 7.4*  --  7.6*   PT/INR No results for input(s): LABPROT, INR in the last 72 hours. ABG No results for input(s): PHART, HCO3 in the last 72 hours.  Invalid input(s): PCO2, PO2  Studies/Results: No results found.  Anti-infectives: Anti-infectives    Start     Dose/Rate Route Frequency Ordered Stop   01/19/16 0600  ciprofloxacin (CIPRO) IVPB 400 mg  Status:  Discontinued     400 mg 200 mL/hr over 60 Minutes Intravenous On call to O.R. 01/18/16 2122 01/19/16 0208   01/19/16 0600  metroNIDAZOLE (FLAGYL) IVPB 500 mg  Status:  Discontinued     500 mg 100 mL/hr over 60 Minutes Intravenous On call to O.R. 01/18/16 2122 01/19/16 0208   01/19/16 0600  piperacillin-tazobactam (ZOSYN) IVPB 3.375 g     3.375 g 12.5 mL/hr over 240 Minutes Intravenous 3 times per day 01/19/16 0251     01/18/16 1945  piperacillin-tazobactam (ZOSYN) IVPB 3.375 g     3.375 g 12.5 mL/hr over  240 Minutes Intravenous STAT 01/18/16 1934 01/18/16 2041   01/18/16 1942  piperacillin-tazobactam (ZOSYN) 3.375 GM/50ML IVPB    Comments:  ORE, HUNTER: cabinet override      01/18/16 1942 01/19/16 0744      Assessment/Plan: s/p Procedure(s): EXPLORATORY LAPAROTOMY, LYSIS OF ADHESIONS, DRAINAGE OF INTRABDOMINAL ABSCESS, HARTMAN'S PROCEDURE, UMBILICAL HERNIA REPAIR    Will advance to full liquid diet at this point awaiting full bowel function however  Lattie Hawichard E Langley Ingalls, MD, FACS  01/22/2016

## 2016-01-22 NOTE — Clinical Social Work Note (Signed)
Clinical Social Work Assessment  Patient Details  Name: Katherine Wood MRN: [id number] Date of Birth: 1940-11-12  Date of referral:  01/22/16               Reason for consult:  Facility Placement                Permission sought to share information with:    Permission granted to share information::     Name::        Agency::     Relationship::     Contact Information:     Housing/Transportation Living arrangements for the past 2 months:  Single Family Home Source of Information:  Patient Patient Interpreter Needed:  None Criminal Activity/Legal Involvement Pertinent to Current Situation/Hospitalization:  No - Comment as needed Significant Relationships:  Spouse Lives with:  Spouse Do you feel safe going back to the place where you live?  Yes Need for family participation in patient care:  Yes (Comment)  Care giving concerns:  Patient resides at home with her husband.   Social Worker assessment / plan:  PT assessed patient and has recommended rehab at discharge. CSW has spoken to patient this afternoon and introduced self and purpose of visit. Patient stated that she lives with her husband and that she believes she will return home rather than consider short term rehab at this time. Patient states she walks everyday around the neighborhood so she can exercise her dogs. Patient was complimentary of the hospital staff and her stay thus far.   Employment status:  Retired Database administratornsurance information:  Managed Medicare PT Recommendations:  Skilled Nursing Facility Information / Referral to community resources:     Patient/Family's Response to care:  Patient expressed appreciation for CSW visit.  Patient/Family's Understanding of and Emotional Response to Diagnosis, Current Treatment, and Prognosis:  Patient believes she will be able to return to her home at discharge.   Emotional Assessment Appearance:  Appears stated age Attitude/Demeanor/Rapport:   (pleasant and  cooperative) Affect (typically observed):  Accepting, Adaptable, Calm, Happy Orientation:  Oriented to Self, Oriented to Place, Oriented to  Time, Oriented to Situation Alcohol / Substance use:  Not Applicable Psych involvement (Current and /or in the community):  No (Comment)  Discharge Needs  Concerns to be addressed:  Care Coordination Readmission within the last 30 days:  No Current discharge risk:  None Barriers to Discharge:  No Barriers Identified   [name], LCSW 01/22/2016, 2:17 PM

## 2016-01-22 NOTE — Care Management Important Message (Signed)
Important Message  Patient Details  Name: Katherine Wood MRN: [id number] Date of Birth: [birth year]   Medicare Important Message Given:  Yes    Chapman FitchBOWEN, Khamiya Varin T, RN 01/22/2016, 2:11 PM

## 2016-01-22 NOTE — Progress Notes (Signed)
Physical Therapy Treatment Patient Details Name: Katherine Wood MRN: [id number] DOB: [birth year] Today's Date: 01/22/2016    History of Present Illness 75 yo F presented to ED due to abdominal pain and diarrhea, found to have a bowel perforation, hernia of abdominal cavity and diverticulitis. PMH includes depression, anxiety, osteoporosis, and HLD. She has an exploratory laparotomy on 3/26 with repairs and colostomy bag placement. She was extubated on 3/27.    PT Comments    Pt demonstrated significant progress towards goals and requires less assistance for mobility. She was able to perform bed mobility with min assistance, primarily due to abdominal pain with rolling and sitting upright. Transfers and ambulation greatly improved using FWW and min guard up to 220 ft. Cues given for staying close to [facility]. Pt becomes fatigued by the end of ambulation. HR ranged 101 to 114 bpm. Pt has improved to the level that she will be safe to discharge from hospital to home with HHPT and assistance from husband as needed. Plan to navigate steps in next session.   Follow Up Recommendations  Home health PT     Equipment Recommendations  Rolling walker with 5" wheels    Recommendations for Other Services       Precautions / Restrictions Precautions Precautions: Fall Restrictions Weight Bearing Restrictions: No    Mobility  Bed Mobility Overal bed mobility: Needs Assistance Bed Mobility: Supine to Sit;Sit to Supine     Supine to sit: Min assist Sit to supine: Min assist   General bed mobility comments: uses rail, difficulty getting trunk upright due to abdominal pain  Transfers Overall transfer level: Needs assistance Equipment used: Rolling walker (2 wheeled) Transfers: Sit to/from [employer] Sit to Stand: Min guard Stand pivot transfers: Min guard       General transfer comment: cues for hand placement; difficulty due to abdominal  pain  Ambulation/Gait Ambulation/Gait assistance: Min guard Ambulation Distance (Feet): 220 Feet Assistive device: Rolling walker (2 wheeled) Gait Pattern/deviations: Decreased stride length;Trunk flexed;Narrow base of support     General Gait Details: Demonstrated continuous gate with steady mobility during ambulation without LOB. Cues for staying close to [facility].   Stairs            Wheelchair Mobility    Modified Rankin (Stroke Patients Only)       Balance Overall balance assessment: Needs assistance Sitting-balance support: Feet supported Sitting balance-Leahy Scale: Good     Standing balance support: Bilateral upper extremity supported Standing balance-Leahy Scale: Fair Standing balance comment: steady with UE support                    Cognition Arousal/Alertness: Awake/alert Behavior During Therapy: WFL for tasks assessed/performed Overall Cognitive Status: Within Functional Limits for tasks assessed                      Exercises Other Exercises Other Exercises: B LE supine therex: ankle pumps, heel slides, hip abd slides and SLR with x10 each. Cues for proper technique. Therapeutic rest breaks for energy conservaiton. Other Exercises: Pt ambulated 220 ft with FWW and min guard. Smooth gait with steady speed. Cues needed for staying close to [facility].    General Comments General comments (skin integrity, edema, etc.): B JP drains (2 on R), colostomy bag      Pertinent Vitals/Pain Pain Assessment: No/denies pain    Home Living  Prior Function            PT Goals (current goals can now be found in the care plan section) Acute Rehab PT Goals Patient Stated Goal: to do whatever it takes PT Goal Formulation: With patient Time For Goal Achievement: 02/03/16 Potential to Achieve Goals: Good Progress towards PT goals: Progressing toward goals    Frequency  Min 2X/week    PT Plan Discharge plan needs to be  updated    Co-evaluation             End of Session Equipment Utilized During Treatment: Gait belt Activity Tolerance: Patient tolerated treatment well;Patient limited by fatigue Patient left: in bed;with call bell/phone within reach;with bed alarm set     Time: 1447-1510 PT Time Calculation (min) (ACUTE ONLY): 23 min  Charges:  $Gait Training: 8-22 mins $Therapeutic Exercise: 8-22 mins                    G Codes:      Adelene IdlerMindy Jo Montrail Mehrer, PT, DPT  01/22/2016, 3:51 PM [phone]

## 2016-01-22 NOTE — Consult Note (Signed)
MEDICATION RELATED CONSULT NOTE - INITIAL   Pharmacy Consult for electrolyte management Indication: hypokalemia, hypomagnesemia  Allergies  Allergen Reactions  . Galantamine Other (See Comments)    Assessment: Electrolytes WNL  Plan:  No supplementation needed at this time, will recheck with AM labs  Garlon HatchetJody Emerly Prak, PharmD Clinical Pharmacist 01/22/2016 3:36 PM

## 2016-01-22 NOTE — Progress Notes (Signed)
MD notified regarding elevated HR in 120s post LR bolus . MD placed orders for metoprolol 12.5mg  PO BID.

## 2016-01-23 LAB — BASIC METABOLIC PANEL
Anion gap: 6 (ref 5–15)
BUN: 14 mg/dL (ref 6–20)
CALCIUM: 7.2 mg/dL — AB (ref 8.9–10.3)
CO2: 24 mmol/L (ref 22–32)
Chloride: 107 mmol/L (ref 101–111)
Creatinine, Ser: 0.91 mg/dL (ref 0.44–1.00)
GFR calc Af Amer: >60 mL/min (ref >60)
GFR calc non Af Amer: >60 mL/min (ref >60)
GLUCOSE: 129 mg/dL — AB (ref 65–99)
Potassium: 3.3 mmol/L — ABNORMAL LOW (ref 3.5–5.1)
Sodium: 137 mmol/L (ref 135–145)

## 2016-01-23 LAB — CBC WITH DIFFERENTIAL/PLATELET
Basophils Absolute: 0 10*3/uL (ref 0–0.1)
Basophils Relative: 0 %
EOS PCT: 0 %
Eosinophils Absolute: 0 10*3/uL (ref 0–0.7)
HEMATOCRIT: 20.6 % — AB (ref 35.0–47.0)
HEMOGLOBIN: 6.9 g/dL — AB (ref 12.0–16.0)
LYMPHS ABS: 0.7 10*3/uL — AB (ref 1.0–3.6)
LYMPHS PCT: 5 %
MCH: 31.3 pg (ref 26.0–34.0)
MCHC: 33.4 g/dL (ref 32.0–36.0)
MCV: 93.7 fL (ref 80.0–100.0)
Monocytes Absolute: 0.6 10*3/uL (ref 0.2–0.9)
Monocytes Relative: 4 %
NEUTROS ABS: 12.3 10*3/uL — AB (ref 1.4–6.5)
NEUTROS PCT: 91 %
Platelets: 307 10*3/uL (ref 150–440)
RBC: 2.19 MIL/uL — AB (ref 3.80–5.20)
RDW: 14.5 % (ref 11.5–14.5)
WBC: 13.7 10*3/uL — AB (ref 3.6–11.0)

## 2016-01-23 MED ORDER — ACETAMINOPHEN 325 MG PO TABS
650.0000 mg | ORAL_TABLET | Freq: Four times a day (QID) | ORAL | Status: DC | PRN
  Administered 2016-01-23: 650 mg via ORAL
  Filled 2016-01-23: qty 2

## 2016-01-23 MED ORDER — OXYCODONE-ACETAMINOPHEN 5-325 MG PO TABS: 1.0000 | ORAL_TABLET | ORAL | Status: DC | PRN

## 2016-01-23 MED ORDER — POTASSIUM CHLORIDE 20 MEQ PO PACK
40.0000 meq | PACK | Freq: Once | ORAL | Status: AC
  Administered 2016-01-23: 40 meq via ORAL
  Filled 2016-01-23: qty 2

## 2016-01-23 NOTE — Clinical Social Work Note (Signed)
PT now recommending home health for patient and patient has declined SNF at this time. RN CM arranging HH. CSW will continue to follow due to the fact that patient has a new colostomy and will have to rely on her husband to manage it. Nursing to begin teaching with husband. If this is not successful, we may need to revisit with patient the need for placement for new colostomy care. York SpanielMonica Breelynn Bankert MSW,LCSW [phone]

## 2016-01-23 NOTE — Care Management Important Message (Signed)
Important Message  Patient Details  Name: Katherine Wood MRN: [id number] Date of Birth: September 18, 1941   Medicare Important Message Given:  Yes    Chapman FitchBOWEN, Surah Pelley T, RN 01/23/2016, 10:37 AM

## 2016-01-23 NOTE — Progress Notes (Signed)
PT Cancellation Note  Patient Details Name: Katherine Wood MRN: [id number] DOB: Aug 15, 1941   Cancelled Treatment:    Reason Eval/Treat Not Completed: Medical issues which prohibited therapy. Pt's chart reviewed and discussed pt with RN. Pt's hemoglobin is currently 6.9. No orders yet for transfusion. She has also developed an elevated temperature. She is inappropriate to participate in therapy at this time. PT will f/u at a later time and resume therapy when appropriate.   [name], PT, DPT  01/23/2016, 11:45 AM [phone]

## 2016-01-23 NOTE — Care Management (Signed)
PT has now recommended home health PT.  Patient's husband at bedside.  I have provided home health agency list.  [facility] was selected.  Jason with [facility] given heads up referral for potential referral over the weekend.  MD has ordered Rolling walker.  Will from [facility] to deliver RW to room today.   Patient does have some element of dementia and shows some signs of confusion at times.  WOC RN to see patient today to assess and teach regarding new ostomy. Husband states that she will be the primary learner, but he will be available for teaching and to assist with her ostomy after discharge.

## 2016-01-23 NOTE — Consult Note (Signed)
MEDICATION RELATED CONSULT NOTE - INITIAL   Pharmacy Consult for electrolyte management Indication: hypokalemia, hypomagnesemia  Allergies  Allergen Reactions  . Galantamine Other (See Comments)    Assessment: K=3.3  Plan:  KCl 40 MEQ packet was given this AM. Will recheck in the AM  Breshae Belcher D Jadesola Poynter, Pharm.D Clinical Pharmacist   01/23/2016 12:29 PM

## 2016-01-23 NOTE — Progress Notes (Signed)
5 Days Post-Op   Subjective:  She seems to be doing well this morning with no major complaints. Her diet has been advanced but she's not had breakfast as yet. She denies any significant abdominal pain. She has been up ambulating with assistance.  Vital signs in last 24 hours: Temp:  [98.4 F (36.9 C)-101.3 F (38.5 C)] 101.3 F (38.5 C) (03/31 0531) Pulse Rate:  [112-117] 112 (03/31 0531) Resp:  [18-19] 18 (03/31 0531) BP: (90-124)/(51-56) 124/51 mmHg (03/31 0531) SpO2:  [91 %-97 %] 91 % (03/31 0531) Last BM Date: 01/16/16  Intake/Output from previous day: 03/30 0701 - 03/31 0700 In: 3876.8 [P.O.:560; I.V.:3316.8] Out: 850 [Urine:800; Drains:50]  GI: Her wound looks good. There is no sign of any infection. Her drains are still in place with serosanguineous drainage. Her ostomy is dusky but viable  Lab Results:  CBC  Recent Labs  01/22/16 0522 01/23/16 0459  WBC 8.6 13.7*  HGB 7.9* 6.9*  HCT 23.5* 20.6*  PLT 305 307   CMP     Component Value Date/Time   NA 137 01/23/2016 0459   NA 144 09/11/2013   K 3.3* 01/23/2016 0459   CL 107 01/23/2016 0459   CO2 24 01/23/2016 0459   GLUCOSE 129* 01/23/2016 0459   BUN 14 01/23/2016 0459   BUN 22* 09/11/2013   CREATININE 0.91 01/23/2016 0459   CALCIUM 7.2* 01/23/2016 0459   PROT 5.2* 01/19/2016 0544   ALBUMIN 2.1* 01/19/2016 0544   AST 39 01/19/2016 0544   ALT 19 01/19/2016 0544   ALKPHOS 46 01/19/2016 0544   BILITOT 1.1 01/19/2016 0544   GFRNONAA >60 01/23/2016 0459   GFRAA >60 01/23/2016 0459   PT/INR No results for input(s): LABPROT, INR in the last 72 hours.  Studies/Results: No results found.  Assessment/Plan: She is doing well at this point. She is starting a liquid diet. She does have some ostomy function so we can get her advance to solid foods and we can discuss discharge.

## 2016-01-23 NOTE — Consult Note (Signed)
WOC ostomy follow up Stoma type/location: LLQ Colostomy Stomal assessment/size: 1 1/2" flush, using barrier ring. Last pouch change was 2 days ago.  Spouse not at bedside at this time. Will come back for additional teaching when spouse is present.  Spouse participated in first pouch change minimally.  Peristomal assessment: Barrier ring Treatment options for stomal/peristomal skin: Ring Output Soft brown stool in pouch.  Ostomy pouching: 2pc. 2 1/4" pouch system with barrier ring.   Education provided: Will return when spouse can participate in care.  Recommend HH after discharge for ongoing education.  [place] Dis[place]: Yes will today.  WOC team will follow.  Maple HudsonKaren Cybil Senegal RN BSN CWON Pager [phone]

## 2016-01-24 LAB — CBC
HEMATOCRIT: 20.7 % — AB (ref 35.0–47.0)
HEMOGLOBIN: 7 g/dL — AB (ref 12.0–16.0)
MCH: 31.6 pg (ref 26.0–34.0)
MCHC: 33.7 g/dL (ref 32.0–36.0)
MCV: 93.7 fL (ref 80.0–100.0)
Platelets: 291 10*3/uL (ref 150–440)
RBC: 2.21 MIL/uL — ABNORMAL LOW (ref 3.80–5.20)
RDW: 14.4 % (ref 11.5–14.5)
WBC: 13.1 10*3/uL — ABNORMAL HIGH (ref 3.6–11.0)

## 2016-01-24 LAB — HEMOGLOBIN AND HEMATOCRIT, BLOOD
HEMATOCRIT: 21.5 % — AB (ref 35.0–47.0)
Hemoglobin: 7.2 g/dL — ABNORMAL LOW (ref 12.0–16.0)

## 2016-01-24 LAB — POTASSIUM: Potassium: 3.7 mmol/L (ref 3.5–5.1)

## 2016-01-24 LAB — BASIC METABOLIC PANEL
Anion gap: 5 (ref 5–15)
BUN: 14 mg/dL (ref 6–20)
CO2: 25 mmol/L (ref 22–32)
Calcium: 7 mg/dL — ABNORMAL LOW (ref 8.9–10.3)
Chloride: 107 mmol/L (ref 101–111)
Creatinine, Ser: 0.89 mg/dL (ref 0.44–1.00)
GFR calc Af Amer: >60 mL/min (ref >60)
GLUCOSE: 126 mg/dL — AB (ref 65–99)
POTASSIUM: 3.2 mmol/L — AB (ref 3.5–5.1)
Sodium: 137 mmol/L (ref 135–145)

## 2016-01-24 LAB — MAGNESIUM: Magnesium: 1.6 mg/dL — ABNORMAL LOW (ref 1.7–2.4)

## 2016-01-24 MED ORDER — MORPHINE SULFATE (PF) 2 MG/ML IV SOLN
2.0000 mg | INTRAVENOUS | Status: DC | PRN
  Administered 2016-01-26 – 2016-02-04 (×11): 2 mg via INTRAVENOUS
  Filled 2016-01-24 (×12): qty 1

## 2016-01-24 MED ORDER — ACETAMINOPHEN 325 MG PO TABS
650.0000 mg | ORAL_TABLET | Freq: Four times a day (QID) | ORAL | Status: DC | PRN
  Administered 2016-01-30: 650 mg via ORAL
  Filled 2016-01-24: qty 2

## 2016-01-24 MED ORDER — POTASSIUM CHLORIDE 10 MEQ/100ML IV SOLN
10.0000 meq | INTRAVENOUS | Status: AC
Start: 2016-01-24 — End: 2016-01-24
  Administered 2016-01-24 (×4): 10 meq via INTRAVENOUS
  Filled 2016-01-24 (×4): qty 100

## 2016-01-24 MED ORDER — HYDROCODONE-ACETAMINOPHEN 5-325 MG PO TABS
2.0000 | ORAL_TABLET | Freq: Four times a day (QID) | ORAL | Status: DC | PRN
  Administered 2016-01-24 (×2): 2 via ORAL
  Filled 2016-01-24 (×2): qty 2

## 2016-01-24 MED ORDER — HYDROCODONE-ACETAMINOPHEN 5-325 MG PO TABS
2.0000 | ORAL_TABLET | ORAL | Status: DC | PRN
  Administered 2016-01-25 – 2016-02-12 (×25): 2 via ORAL
  Filled 2016-01-24 (×25): qty 2

## 2016-01-24 MED ORDER — MAGNESIUM SULFATE 4 GM/100ML IV SOLN
4.0000 g | Freq: Once | INTRAVENOUS | Status: AC
  Administered 2016-01-24: 4 g via INTRAVENOUS
  Filled 2016-01-24: qty 100

## 2016-01-24 NOTE — Consult Note (Addendum)
MEDICATION RELATED CONSULT NOTE - INITIAL   Pharmacy Consult for electrolyte management Indication: hypokalemia, hypomagnesemia  Allergies  Allergen Reactions  . Galantamine Other (See Comments)    Assessment: K=3.2; Magnesium: 1.6   Plan:  Will give KCl 10 mEq IV x 4 runs. Will give Magnesium 4 g IV x 1. K recheck ordered for 17:00.   Demetrius Charityeldrin D. Neshia Mckenzie, PharmD  Clinical Pharmacist   01/24/2016 10:59 AM

## 2016-01-24 NOTE — Consult Note (Signed)
MEDICATION RELATED CONSULT NOTE - INITIAL   Pharmacy Consult for electrolyte management Indication: hypokalemia, hypomagnesemia  Allergies  Allergen Reactions  . Galantamine Other (See Comments)    Assessment: K=3.7  Plan:  Potassium within range at 1700 recheck, will follow up on am labs.   Clovia CuffLisa Roman Sandall, PharmD, BCPS 01/24/2016 6:17 PM

## 2016-01-24 NOTE — Progress Notes (Signed)
6 Days Post-Op   Subjective:  She remains lying comfortably in bed with no major complaints but with her dimensions difficult to get a handle on her situation. She has not made much progress in ostomy care. She denies any new pain or nausea. She's taking some liquids. Her hemoglobin is down at 6.9 or 7 with white blood cell count of 13,000.  Vital signs in last 24 hours: Temp:  [98.4 F (36.9 C)-100.3 F (37.9 C)] 98.4 F (36.9 C) (04/01 0527) Pulse Rate:  [93-117] 93 (04/01 0527) Resp:  [16-18] 17 (04/01 0527) BP: (100-113)/(52-59) 107/56 mmHg (04/01 0527) SpO2:  [93 %-99 %] 93 % (04/01 0527) Last BM Date: 01/23/16 (colostomy)  Intake/Output from previous day: 03/31 0701 - 04/01 0700 In: 3580.6 [P.O.:540; I.V.:2733.6; IV Piggyback:307] Out: 1235 [Urine:800; Drains:60; Stool:375]  GI: Her abdomen is soft with mild distention. There is minimal drainage in the JP drains. There does not appear to be any purulent drainage. She has some erythema in the wound but no obvious infection. She does have bowel sounds although they are hypoactive.  Lab Results:  CBC  Recent Labs  01/23/16 0459 01/24/16 0412  WBC 13.7* 13.1*  HGB 6.9* 7.0*  HCT 20.6* 20.7*  PLT 307 291   CMP     Component Value Date/Time   NA 137 01/24/2016 0412   NA 144 09/11/2013   K 3.2* 01/24/2016 0412   CL 107 01/24/2016 0412   CO2 25 01/24/2016 0412   GLUCOSE 126* 01/24/2016 0412   BUN 14 01/24/2016 0412   BUN 22* 09/11/2013   CREATININE 0.89 01/24/2016 0412   CALCIUM 7.0* 01/24/2016 0412   PROT 5.2* 01/19/2016 0544   ALBUMIN 2.1* 01/19/2016 0544   AST 39 01/19/2016 0544   ALT 19 01/19/2016 0544   ALKPHOS 46 01/19/2016 0544   BILITOT 1.1 01/19/2016 0544   GFRNONAA >60 01/24/2016 0412   GFRAA >60 01/24/2016 0412   PT/INR No results for input(s): LABPROT, INR in the last 72 hours.  Studies/Results: No results found.  Assessment/Plan: Overall she is stable. We will continue to follow her  hemoglobin. I will not transfuse her at this point if she remains hemodynamically stable. We will try to increase her activity level. We'll also see if we can get her out of bed today. I did review the wound ostomy nurse's notes.

## 2016-01-24 NOTE — Progress Notes (Signed)
Physical Therapy Treatment Patient Details Name: Katherine Wood MRN: [id number] DOB: 1941-03-20 Today's Date: 01/24/2016    History of Present Illness 75 yo F presented to ED due to abdominal pain and diarrhea, found to have a bowel perforation, hernia of abdominal cavity and diverticulitis. PMH includes depression, anxiety, osteoporosis, and HLD. She has an exploratory laparotomy on 3/26 with repairs and colostomy bag placement. She was extubated on 3/27.    PT Comments    Pt unable to ambulate this session due to increased pain with bed mobility and standing.  Pt reluctant to even attempt getting up this session but agreed with encouragement.  Instructed pt to get up daily for ambulation short distances or to sit in chair.  Pt agreeable to attempt ambulation next session.  Cont with POC.   Follow Up Recommendations  Home health PT     Equipment Recommendations  Rolling walker with 5" wheels    Recommendations for Other Services       Precautions / Restrictions Precautions Precautions: Fall Restrictions Weight Bearing Restrictions: No    Mobility  Bed Mobility Overal bed mobility: Needs Assistance Bed Mobility: Supine to Sit;Sit to Supine     Supine to sit: Min assist Sit to supine: Mod assist   General bed mobility comments: log roll to L side; verbal cues for sequencing and tactile cues to lower legs off/on bed  Transfers Overall transfer level: Needs assistance Equipment used: Rolling walker (2 wheeled) Transfers: Sit to/from Stand Sit to Stand: Min assist         General transfer comment: Cues for hand placement and assist for lifting off bed  Ambulation/Gait             General Gait Details: Unable this date due to increased pain.   Stairs            Wheelchair Mobility    Modified Rankin (Stroke Patients Only)       Balance                                    Cognition Arousal/Alertness: Lethargic Behavior During  Therapy: WFL for tasks assessed/performed Overall Cognitive Status: Within Functional Limits for tasks assessed                      Exercises Other Exercises Other Exercises: could not tolerate due to pain    General Comments General comments (skin integrity, edema, etc.): jp drain x 3, colostomy      Pertinent Vitals/Pain Pain Assessment: 0-10 Pain Score: 5  Pain Location: all across my abdomen Pain Intervention(s): Limited activity within patient's tolerance;Monitored during session    Home Living                      Prior Function            PT Goals (current goals can now be found in the care plan section) Acute Rehab PT Goals Patient Stated Goal: to do whatever it takes PT Goal Formulation: With patient Time For Goal Achievement: 02/03/16 Potential to Achieve Goals: Good Progress towards PT goals: PT to reassess next treatment    Frequency  Min 2X/week    PT Plan Current plan remains appropriate    Co-evaluation             End of Session   Activity Tolerance: Patient limited by pain Patient  left: in bed;with call bell/phone within reach;with bed alarm set     Time: 1710-1730 PT Time Calculation (min) (ACUTE ONLY): 20 min  Charges:  $Therapeutic Activity: 8-22 mins                    G Codes:      Kellis Topete A Dalyce Renne Feb 08, 2016, 5:39 PM

## 2016-01-25 ENCOUNTER — Encounter: Payer: Self-pay | Admitting: Radiology

## 2016-01-25 ENCOUNTER — Inpatient Hospital Stay: Payer: Medicare Other

## 2016-01-25 LAB — PREPARE RBC (CROSSMATCH)

## 2016-01-25 LAB — BASIC METABOLIC PANEL
ANION GAP: 5 (ref 5–15)
BUN: 14 mg/dL (ref 6–20)
CALCIUM: 7.1 mg/dL — AB (ref 8.9–10.3)
CO2: 24 mmol/L (ref 22–32)
Chloride: 108 mmol/L (ref 101–111)
Creatinine, Ser: 0.88 mg/dL (ref 0.44–1.00)
GLUCOSE: 109 mg/dL — AB (ref 65–99)
POTASSIUM: 3.7 mmol/L (ref 3.5–5.1)
Sodium: 137 mmol/L (ref 135–145)

## 2016-01-25 LAB — CBC
HEMATOCRIT: 20.6 % — AB (ref 35.0–47.0)
Hemoglobin: 6.8 g/dL — ABNORMAL LOW (ref 12.0–16.0)
MCH: 31.4 pg (ref 26.0–34.0)
MCHC: 33.2 g/dL (ref 32.0–36.0)
MCV: 94.7 fL (ref 80.0–100.0)
PLATELETS: 312 10*3/uL (ref 150–440)
RBC: 2.18 MIL/uL — AB (ref 3.80–5.20)
RDW: 14.8 % — AB (ref 11.5–14.5)
WBC: 16.6 10*3/uL — AB (ref 3.6–11.0)

## 2016-01-25 MED ORDER — SODIUM CHLORIDE 0.9 % IV SOLN
Freq: Once | INTRAVENOUS | Status: AC
Start: 2016-01-25 — End: 2016-01-25
  Administered 2016-01-25: 16:00:00 via INTRAVENOUS

## 2016-01-25 MED ORDER — DIATRIZOATE MEGLUMINE & SODIUM 66-10 % PO SOLN
15.0000 mL | ORAL | Status: AC
  Administered 2016-01-25 (×2): 15 mL via ORAL

## 2016-01-25 MED ORDER — IOPAMIDOL (ISOVUE-300) INJECTION 61%
100.0000 mL | Freq: Once | INTRAVENOUS | Status: AC | PRN
  Administered 2016-01-25: 100 mL via INTRAVENOUS

## 2016-01-25 MED ORDER — SODIUM CHLORIDE 0.9 % IV SOLN
1.0000 g | INTRAVENOUS | Status: DC
  Administered 2016-01-25 – 2016-01-29 (×5): 1 g via INTRAVENOUS
  Filled 2016-01-25 (×5): qty 1

## 2016-01-25 MED ORDER — METRONIDAZOLE IN NACL 5-0.79 MG/ML-% IV SOLN
500.0000 mg | Freq: Three times a day (TID) | INTRAVENOUS | Status: DC
  Administered 2016-01-25 – 2016-02-09 (×45): 500 mg via INTRAVENOUS
  Filled 2016-01-25 (×48): qty 100

## 2016-01-25 NOTE — Progress Notes (Signed)
Afebrile. Transient tachycardia last 24 hours. BP normal. Bright, cheerful. No complaints at present. Lungs: Clear. Cardio: RR. ABD: Soft, no focal tenderness. Multiple drains w/ serosang drainage, 50 cc/ 24 total. Stoma functioning well. Calves: Soft. WBC: Minimal change at 16K. HGB bobbling around 7 last 4 days, 6.8 this AM. Patient asymptomatic. Renal panel OK. CT ordered for today to assess for undrained purluence. Likely, present drains can be removed. Will order T&S for AM.  No family present today.

## 2016-01-25 NOTE — Progress Notes (Signed)
MD order to transfuse 2 units packed red blood cells. Telephone consent obtained from patient's husband with Cyndi LennertEmily Nickey Kloepfer, RN and Donnalee CurryJeffrey Byrnett, MD witnessing and signing consent accordingly. MD order to hold maintenance fluids during blood transfusions.  Ron ParkerHerron, Jeray Shugart D, RN

## 2016-01-25 NOTE — Progress Notes (Signed)
Dr. Lemar LivingsByrnett notified of slight rising temperature throughout blood transfusions. Tylenol/Hydrocodone given previously.  No new orders at this time. MD order to continue to monitor.   Ron ParkerHerron, Petrina Melby D, RN

## 2016-01-25 NOTE — Progress Notes (Signed)
CT from earlier today reviewed, multiple large abscess. Areas around drains are clear. Discussed CT findings w/ husband and indication for transfusion if she needs additional procedures.He is amendable to transfusion.  Phone consent w/ Irving BurtonEmily, RN.  Considering size and multiplicity of abscesses, she may be a candidate for open rather than percutaneous drainage.  In light of HGB of 6.8 today, will transfuse 2 units and hold NPO after MN.  Placed on schedule w/ Dr. Everlene FarrierPabon for AM. He can make final decision re: OR/ Interventional radiology.

## 2016-01-25 NOTE — Consult Note (Signed)
MEDICATION RELATED CONSULT NOTE - INITIAL   Pharmacy Consult for electrolyte management Indication: hypokalemia, hypomagnesemia  Allergies  Allergen Reactions  . Galantamine Other (See Comments)    Assessment: K=3.7  Plan:  Electrolytes WNL. Will recheck with AM labs.  Charlotte Fidalgo A. [place], Vermont[place]arm.D., BCPS Clinical Pharmacist  01/25/2016 6:38 AM

## 2016-01-26 ENCOUNTER — Encounter
Admission: EM | Disposition: A | Discharge status: Skilled Nursing Facility | Payer: Self-pay | Source: Home / Self Care | Attending: Surgery

## 2016-01-26 LAB — CBC WITH DIFFERENTIAL/PLATELET
Basophils Absolute: 0.1 10*3/uL (ref 0–0.1)
Basophils Relative: 1 %
Eosinophils Absolute: 0.1 10*3/uL (ref 0–0.7)
HCT: 26.4 % — ABNORMAL LOW (ref 35.0–47.0)
Hemoglobin: 9 g/dL — ABNORMAL LOW (ref 12.0–16.0)
LYMPHS ABS: 0.5 10*3/uL — AB (ref 1.0–3.6)
MCH: 30.5 pg (ref 26.0–34.0)
MCHC: 34.1 g/dL (ref 32.0–36.0)
MCV: 89.3 fL (ref 80.0–100.0)
MONO ABS: 0.8 10*3/uL (ref 0.2–0.9)
Monocytes Relative: 7 %
Neutro Abs: 9.1 10*3/uL — ABNORMAL HIGH (ref 1.4–6.5)
Neutrophils Relative %: 86 %
PLATELETS: 344 10*3/uL (ref 150–440)
RBC: 2.95 MIL/uL — AB (ref 3.80–5.20)
RDW: 16 % — AB (ref 11.5–14.5)
WBC: 10.5 10*3/uL (ref 3.6–11.0)

## 2016-01-26 LAB — POTASSIUM: Potassium: 3.4 mmol/L — ABNORMAL LOW (ref 3.5–5.1)

## 2016-01-26 LAB — BASIC METABOLIC PANEL
Anion gap: 4 — ABNORMAL LOW (ref 5–15)
BUN: 14 mg/dL (ref 6–20)
CALCIUM: 7.3 mg/dL — AB (ref 8.9–10.3)
CO2: 26 mmol/L (ref 22–32)
CREATININE: 0.69 mg/dL (ref 0.44–1.00)
Chloride: 106 mmol/L (ref 101–111)
GFR calc Af Amer: >60 mL/min (ref >60)
GFR calc non Af Amer: >60 mL/min (ref >60)
GLUCOSE: 96 mg/dL (ref 65–99)
Potassium: 2.7 mmol/L — CL (ref 3.5–5.1)
Sodium: 136 mmol/L (ref 135–145)

## 2016-01-26 LAB — TYPE AND SCREEN
ABO/RH(D): A POS
Antibody Screen: NEGATIVE
UNIT DIVISION: 0
Unit division: 0

## 2016-01-26 LAB — PHOSPHORUS: Phosphorus: 3.5 mg/dL (ref 2.5–4.6)

## 2016-01-26 LAB — MAGNESIUM: Magnesium: 1.9 mg/dL (ref 1.7–2.4)

## 2016-01-26 SURGERY — LAPAROTOMY, EXPLORATORY
Anesthesia: Choice

## 2016-01-26 MED ORDER — POTASSIUM CHLORIDE CRYS ER 20 MEQ PO TBCR
20.0000 meq | EXTENDED_RELEASE_TABLET | Freq: Once | ORAL | Status: AC
  Administered 2016-01-26: 20 meq via ORAL
  Filled 2016-01-26: qty 1

## 2016-01-26 MED ORDER — POTASSIUM CHLORIDE CRYS ER 20 MEQ PO TBCR
40.0000 meq | EXTENDED_RELEASE_TABLET | Freq: Once | ORAL | Status: AC
  Administered 2016-01-26: 40 meq via ORAL
  Filled 2016-01-26: qty 2

## 2016-01-26 MED ORDER — POTASSIUM CHLORIDE 10 MEQ/100ML IV SOLN
10.0000 meq | Freq: Once | INTRAVENOUS | Status: DC
  Filled 2016-01-26: qty 100

## 2016-01-26 MED ORDER — POTASSIUM CHLORIDE 10 MEQ/100ML IV SOLN
10.0000 meq | INTRAVENOUS | Status: AC
  Administered 2016-01-26 (×4): 10 meq via INTRAVENOUS
  Filled 2016-01-26 (×4): qty 100

## 2016-01-26 NOTE — Consult Note (Addendum)
MEDICATION RELATED CONSULT NOTE - Follow up   Pharmacy Consult for electrolyte management Indication: hypokalemia, hypomagnesemia  Allergies  Allergen Reactions  . Galantamine Other (See Comments)    Assessment: K=3.4, Mag 1.9, Phos 3.5 Scr 0.69, CrCl ~ 52.5 ml/min Pt is NPO after midnight, surgery rescheduled for the AM  Plan:  Will give KCL 20 MEQ po since pt is no longer NPO (NPO after midnight) Will recheck electrolytes in the AM  Pharmacy will continue to follow.   Katherine Wood, Pharm.D Clinical Pharmacist   01/26/2016 4:43 PM

## 2016-01-26 NOTE — Progress Notes (Signed)
PT Cancellation Note  Patient Details Name: Rueben BashRosemarie Z Linebaugh MRN: [id number] DOB: 1941/04/12   Cancelled Treatment:    Reason Eval/Treat Not Completed: Patient not medically ready;Medical issues which prohibited therapy. Pt's potassium is currently at 2.7 and inappropriate to participate in therapy. She was scheduled for a procedure today to place abdominal drains for abscesses, but has been rescheduled for tomorrow. PT will f/u at a later time and resume therapy with appropriate.   Adelene IdlerMindy Jo Dellar Traber, PT, DPT  01/26/2016, 12:01 PM [phone]

## 2016-01-26 NOTE — Consult Note (Signed)
MEDICATION RELATED CONSULT NOTE - Follow up   Pharmacy Consult for electrolyte management Indication: hypokalemia, hypomagnesemia  Allergies  Allergen Reactions  . Galantamine Other (See Comments)    Assessment: K=2.7, Mag 1.9, Phos 3.5 Scr 0.69, CrCl ~ 52.5 ml/min Pt currently NPO  Plan:  Will order KCl 10 mEq IV x 4 runs Recheck K at 1600 Recheck labs in AM as well  Pharmacy will continue to follow.   Crist FatHannah Joell Usman, PharmD, BCPS Clinical Pharmacist 01/26/2016 8:05 AM

## 2016-01-26 NOTE — Progress Notes (Signed)
POD # 8 s/p Hartman's her for a diverticulitis and purulent peritonitis CT scan performed yesterday due to increase in white count. This shows multiple collections intra-abdominally. CT scan personal review and discussed with interventional radiologist. We feel that we can at least get 2 drains placed in the largest of these collections. I did have a lengthy discussion with the husband and explained to him the options of percutaneous drainage by our versus open drainage in the OR. Currently the patient is not septic, she is not peritoneally, on exam and I think is reasonable to start with percutaneous drainage continuation of antibiotics and reassess in with another CT scan in a few days about the need for further interventions. Discussed with the husband who is the power of attorney and we'll takes incisions for her and he understands and actually he wishes to do the least invasive procedures first and if we aren't able to address the infection source remain need to reoperate on her. Extensive counseling provided. Plan for today is Perc draiange of abscess by IR, continuation of A/Bs, hold off laparotomy.

## 2016-01-26 NOTE — Consult Note (Signed)
WOC ostomy follow up Stoma type/location: LLQ Colostomy Stomal assessment/size: 1 1/2" flush stoma.  Add barrier ring for improved fit and convexity.  Peristomal assessment: Itnact Treatment options for stomal/peristomal skin: Barrier ring Output Soft brown stool Ostomy pouching: 2pc. 2 1/4" pouch with barrier ring  Education provided: Spouse at bedside.  Indicated that patient would need drains placed today for infection.  Will wait and change pouch tomorrow.  Was last changed 2 days ago.  [place]: Yes WOC tem will follow.  Maple HudsonKaren Zolton Dowson RN BSN CWON Pager [phone]

## 2016-01-26 NOTE — Progress Notes (Signed)
Central telemetry notified RN concerning pt running 3 beats of PVC's with a HR of 109. Doctor Winona LegatoVaickute was notified of findings and new orders for Potassium  40 mg PO one time dose. Will continue to monitor pt.   Karsten RoLauren E Hobbs

## 2016-01-26 NOTE — OR Nursing (Signed)
Pt had Lovenox 2100 4/2, plan to reschedule abcess drain placement for am 4/4, NPO after midnight, Hold 4/3 dose of Lovenox.

## 2016-01-26 NOTE — Care Management (Signed)
Plan for patient to return to OR for placement of drains for intrabdominal abscesses.

## 2016-01-26 NOTE — Progress Notes (Signed)
We will hold lovenox, procedure may be postponed for tomorrow per IR

## 2016-01-26 NOTE — Progress Notes (Signed)
Notified Dr. Everlene FarrierPabon regarding critical potassium of K+ 2.6. MD acknowledged.

## 2016-01-26 NOTE — Care Management Important Message (Signed)
Important Message  Patient Details  Name: Katherine Wood MRN: [id number] Date of Birth: 12-04-1940   Medicare Important Message Given:  Yes    [name] 01/26/2016, 1:33 PM

## 2016-01-27 ENCOUNTER — Inpatient Hospital Stay: Admit: 2016-01-27 | Payer: Medicare Other

## 2016-01-27 LAB — CBC
HCT: 29.1 % — ABNORMAL LOW (ref 35.0–47.0)
Hemoglobin: 9.8 g/dL — ABNORMAL LOW (ref 12.0–16.0)
MCH: 30.4 pg (ref 26.0–34.0)
MCHC: 33.6 g/dL (ref 32.0–36.0)
MCV: 90.5 fL (ref 80.0–100.0)
PLATELETS: 412 10*3/uL (ref 150–440)
RBC: 3.22 MIL/uL — AB (ref 3.80–5.20)
RDW: 15.7 % — ABNORMAL HIGH (ref 11.5–14.5)
WBC: 9.8 10*3/uL (ref 3.6–11.0)

## 2016-01-27 LAB — BASIC METABOLIC PANEL
ANION GAP: 5 (ref 5–15)
BUN: 13 mg/dL (ref 6–20)
CALCIUM: 7.5 mg/dL — AB (ref 8.9–10.3)
CO2: 24 mmol/L (ref 22–32)
Chloride: 107 mmol/L (ref 101–111)
Creatinine, Ser: 0.58 mg/dL (ref 0.44–1.00)
Glucose, Bld: 100 mg/dL — ABNORMAL HIGH (ref 65–99)
Potassium: 4 mmol/L (ref 3.5–5.1)
Sodium: 136 mmol/L (ref 135–145)

## 2016-01-27 LAB — MAGNESIUM: Magnesium: 1.7 mg/dL (ref 1.7–2.4)

## 2016-01-27 MED ORDER — MIDAZOLAM HCL 5 MG/5ML IJ SOLN
INTRAMUSCULAR | Status: AC
  Filled 2016-01-27: qty 5

## 2016-01-27 MED ORDER — SODIUM CHLORIDE 0.9 % IV SOLN
INTRAVENOUS | Status: DC
  Administered 2016-01-27: 11:00:00 via INTRAVENOUS

## 2016-01-27 MED ORDER — FENTANYL CITRATE (PF) 100 MCG/2ML IJ SOLN
INTRAMUSCULAR | Status: AC
  Filled 2016-01-27: qty 2

## 2016-01-27 MED ORDER — MAGNESIUM SULFATE 2 GM/50ML IV SOLN
2.0000 g | Freq: Once | INTRAVENOUS | Status: AC
  Administered 2016-01-27: 2 g via INTRAVENOUS
  Filled 2016-01-27: qty 50

## 2016-01-27 MED ORDER — DIPHENHYDRAMINE HCL 25 MG PO CAPS
25.0000 mg | ORAL_CAPSULE | Freq: Once | ORAL | Status: AC
  Administered 2016-01-28: 25 mg via ORAL
  Filled 2016-01-27: qty 1

## 2016-01-27 MED ORDER — ENOXAPARIN SODIUM 40 MG/0.4ML ~~LOC~~ SOLN
40.0000 mg | SUBCUTANEOUS | Status: DC
  Administered 2016-01-27 – 2016-01-30 (×4): 40 mg via SUBCUTANEOUS
  Filled 2016-01-27 (×4): qty 0.4

## 2016-01-27 NOTE — Procedures (Signed)
CT guided abscess drain times two  Katherine Wood bowel contents in fluid collections  Complications:  None  Blood Loss: none  See dictation in canopy pacs

## 2016-01-27 NOTE — Progress Notes (Signed)
Per Dr. Everlene FarrierPabon okay to wait until after 7pm for IV team to place PICC line. Will call husband to obtain consent.

## 2016-01-27 NOTE — Progress Notes (Signed)
  POD # 9 s/p Hartman's her for a diverticulitis and purulent peritonitis Now w multiple collections intra-abdominally. interventional radiologist to drain today Taking Po w/o N/V  She is otherwise feeling better, now afebile VSS, wbc trending down, k replaced.  PE: NAD awake alert Abd: soft, NT, incision open w good granulation tissue. Current JP serous drainage. Ostomy w good stool  A/p Collections for IR drainage, will hold off laparotomy for now, if collections not controlled w drain may need surgical drainage Continue A/B and PT

## 2016-01-27 NOTE — Consult Note (Signed)
MEDICATION RELATED CONSULT NOTE - Follow up   Pharmacy Consult for electrolyte management Indication: hypokalemia, hypomagnesemia  Allergies  Allergen Reactions  . Galantamine Other (See Comments)    Assessment: K 4.0, Mag 1.7 Scr 0.58, CrCl ~ 58 ml/min Pt is NPO for procedure  Plan:  Will order mag sulfate 2 g IV x1 (no K supp needed at this time) Will recheck electrolytes in the AM  Pharmacy will continue to follow.   Crist FatHannah Raesha Coonrod, PharmD, BCPS Clinical Pharmacist 01/27/2016 9:47 AM

## 2016-01-27 NOTE — Sedation Documentation (Signed)
#   14 Fr multipurpose drains x 2 placed without difficulty.  Specimens sent to lab.  525 ml removed via wall suction from peri-hepatic drain.

## 2016-01-27 NOTE — Progress Notes (Signed)
Post IR Procedure Consult - Anticoagulant/Antiplatelet PTA/Inpatient Med List Review by Pharmacist   Pt on lovenox 40 mg Sq q24h prior to procedure. S/p low risk procedure per consult. Will restart lovenox 40 mg SQ q24h tonight at 2200. Confirmed with surgeon.

## 2016-01-27 NOTE — Progress Notes (Signed)
IV team notified concerning PICC placement order. IV team stated that PICC nurse will be here in the morning 01/28/2016. Doctor Excell Seltzerooper notified concerning PICC nurse will not be here in the morning to place the PICC, Doctor Excell SeltzerCooper stated that it is ok for PICC nurse to place PICC in the am 01/28/2016.   Katherine Wood

## 2016-01-27 NOTE — Clinical Documentation Improvement (Signed)
General Surgery Internal Medicine  "In light of HGB of 6.8 today, will transfuse 2 units and hold NPO after MN" documented in 01/25/16 progress note. Please provide a diagnosis associated with the above clinical indicators and treatment provided and document findings in next progress note. Thank you!   Iron deficiency Anemia  Acute Blood Loss Anemia  Nutritional anemia, including the nutrition or mineral deficits  Chronic Blood Loss Anemia, including the suspected or known cause  Anemia of chronic disease, including the associated chronic disease state  Other  Clinically Undetermined  Document any associated diagnoses/conditions.  Supporting Information:  Transfused 2u PRBC's as indicated  Please exercise your independent, professional judgment when responding. A specific answer is not anticipated or expected.  Thank You,  Shellee MiloEileen T Chue Berkovich RN, BSN, CCDS Health Information Management [place] [phone]; Cell: [phone]

## 2016-01-27 NOTE — Progress Notes (Addendum)
Initial Nutrition Assessment    INTERVENTION:  -Monitor diet progression -Recommend once on po diet add boost breeze or ensure enlive for added nutrition -Will ask nursing for new wt  NUTRITION DIAGNOSIS:   Inadequate oral intake related to altered GI function as evidenced by NPO status.    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 Past Medical History  Diagnosis Date  . Allergy   . Depression   . Hyperlipidemia   . Anxiety   . Osteoporosis     Pt with limited intake (sips and bites) of clear liquid full liquids for the past 9 days of admission  Pt reports normal intake prior to admission, eating 3 meals per day  Medications reviewed protonix, D5 with LR at 4975ml/hr Labs reviewed: glucose 100  Nutrition-Focused physical exam completed. Findings are no fat depletion, mild/moderate muscle depletion, and mild edema.     Diet Order:  Diet NPO time specified Except for: Sips with Meds  Skin:  Reviewed, no issues  Last BM:  01/26/2016 (200ml noted in ostomy)  Height:   Ht Readings from Last 1 Encounters:  01/19/16 5\' 7"  (1.702 m)    Weight: Pt unsure of any wt changes and does not know UBW  Wt Readings from Last 1 Encounters:  01/19/16 128 lb 12 oz (58.4 kg)    Ideal Body Weight:     BMI:  Body mass index is 20.16 kg/(m^2).  Estimated Nutritional Needs:   Kcal:  1450-1740 kcals/d  Protein:  (1.0-1.2 g/kg) 58-70 g/d  Fluid:  1450-176240ml/d  EDUCATION NEEDS:   Education needs no appropriate at this time  Saturnino Liew B. Freida BusmanAllen, RD, LDN [phone] (pager) Weekend/On-Call pager [phone])

## 2016-01-27 NOTE — Progress Notes (Signed)
PT Cancellation Note  Patient Details Name: Katherine Wood MRN: [id number] DOB: August 21, 1941   Cancelled Treatment:    Reason Eval/Treat Not Completed: Patient at procedure or test/unavailable. Pt's chart reviewed and discussed pt with RN. Pt is currently off the floor at a procedure that was rescheduled from yesterday for drain placement. PT will reattempt at a later time and resume therapy when appropriate.   Adelene IdlerMindy Jo Foday Cone, PT, DPT  01/27/2016, 2:37 PM [phone]

## 2016-01-28 LAB — CBC
HEMATOCRIT: 28.1 % — AB (ref 35.0–47.0)
HEMOGLOBIN: 9.6 g/dL — AB (ref 12.0–16.0)
MCH: 31.4 pg (ref 26.0–34.0)
MCHC: 34.1 g/dL (ref 32.0–36.0)
MCV: 92.1 fL (ref 80.0–100.0)
Platelets: 486 10*3/uL — ABNORMAL HIGH (ref 150–440)
RBC: 3.05 MIL/uL — ABNORMAL LOW (ref 3.80–5.20)
RDW: 15.6 % — ABNORMAL HIGH (ref 11.5–14.5)
WBC: 9.3 10*3/uL (ref 3.6–11.0)

## 2016-01-28 LAB — BASIC METABOLIC PANEL
Anion gap: 4 — ABNORMAL LOW (ref 5–15)
BUN: 9 mg/dL (ref 6–20)
CHLORIDE: 108 mmol/L (ref 101–111)
CO2: 25 mmol/L (ref 22–32)
CREATININE: 0.67 mg/dL (ref 0.44–1.00)
Calcium: 7.3 mg/dL — ABNORMAL LOW (ref 8.9–10.3)
GFR calc Af Amer: >60 mL/min (ref >60)
GFR calc non Af Amer: >60 mL/min (ref >60)
Glucose, Bld: 99 mg/dL (ref 65–99)
Potassium: 3.7 mmol/L (ref 3.5–5.1)
Sodium: 137 mmol/L (ref 135–145)

## 2016-01-28 LAB — GLUCOSE, CAPILLARY
Glucose-Capillary: 107 mg/dL — ABNORMAL HIGH (ref 65–99)
Glucose-Capillary: 92 mg/dL (ref 65–99)
Glucose-Capillary: 113 mg/dL — ABNORMAL HIGH (ref 65–99)

## 2016-01-28 LAB — MAGNESIUM: Magnesium: 1.9 mg/dL (ref 1.7–2.4)

## 2016-01-28 MED ORDER — SODIUM CHLORIDE 0.9% FLUSH
10.0000 mL | Freq: Two times a day (BID) | INTRAVENOUS | Status: DC
  Administered 2016-01-28 – 2016-02-01 (×10): 10 mL
  Administered 2016-02-02: 20 mL
  Administered 2016-02-03 – 2016-02-05 (×5): 10 mL
  Administered 2016-02-06: 20 mL
  Administered 2016-02-07 – 2016-02-10 (×6): 10 mL
  Administered 2016-02-11: 20 mL
  Administered 2016-02-11: 10 mL
  Administered 2016-02-12: 20 mL
  Administered 2016-02-12 – 2016-02-14 (×4): 10 mL

## 2016-01-28 MED ORDER — SODIUM CHLORIDE 0.9% FLUSH: 10.0000 mL | INTRAVENOUS | Status: DC | PRN

## 2016-01-28 MED ORDER — INSULIN ASPART 100 UNIT/ML ~~LOC~~ SOLN
0.0000 [IU] | SUBCUTANEOUS | Status: DC
  Administered 2016-01-29 – 2016-01-31 (×10): 1 [IU] via SUBCUTANEOUS
  Administered 2016-02-01: 2 [IU] via SUBCUTANEOUS
  Administered 2016-02-01 (×3): 1 [IU] via SUBCUTANEOUS
  Administered 2016-02-01: 2 [IU] via SUBCUTANEOUS
  Administered 2016-02-02: 1 [IU] via SUBCUTANEOUS
  Administered 2016-02-02 (×5): 2 [IU] via SUBCUTANEOUS
  Administered 2016-02-03 – 2016-02-06 (×14): 1 [IU] via SUBCUTANEOUS
  Administered 2016-02-07: 2 [IU] via SUBCUTANEOUS
  Administered 2016-02-07 – 2016-02-14 (×26): 1 [IU] via SUBCUTANEOUS
  Filled 2016-01-28 (×13): qty 1
  Filled 2016-01-28 (×2): qty 2
  Filled 2016-01-28 (×5): qty 1
  Filled 2016-01-28: qty 2
  Filled 2016-01-28 (×2): qty 1
  Filled 2016-01-28 (×2): qty 2
  Filled 2016-01-28 (×3): qty 1
  Filled 2016-01-28: qty 2
  Filled 2016-01-28 (×2): qty 1
  Filled 2016-01-28: qty 2
  Filled 2016-01-28 (×16): qty 1
  Filled 2016-01-28: qty 2
  Filled 2016-01-28 (×11): qty 1
  Filled 2016-01-28: qty 2
  Filled 2016-01-28 (×2): qty 1

## 2016-01-28 MED ORDER — TRACE MINERALS CR-CU-MN-SE-ZN 10-1000-500-60 MCG/ML IV SOLN
INTRAVENOUS | Status: AC
  Administered 2016-01-28: 18:00:00 via INTRAVENOUS
  Filled 2016-01-28: qty 720

## 2016-01-28 NOTE — Progress Notes (Signed)
Nutrition Follow-up     INTERVENTION:  -Verbal order received from Dr. Everlene FarrierPabon this am to start TPN. Pt not meeting nutritional needs > 7 days.  Recommend 5%AA/20% dextrose at goal rate of 3860ml/hr and 20% lipids 2 times per week.  Will provide 1552 kcals, 72 g of protein (including lipids). Recommend pharmacy consult, checking labs in am and if tolerating increase to goal rate.    NUTRITION DIAGNOSIS:   Inadequate oral intake related to altered GI function as evidenced by NPO status.    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  Pt continues to be NPO, limited nutrition > 7 days Medications reviewed: D5 with LR at 1175ml/hr Labs reviewed : Calcium 7.3 UOP: 1100ml last 24 hr  Diet Order:  Diet clear liquid Room service appropriate?: Yes; Fluid consistency:: Thin  Skin:  Reviewed, no issues  Last BM:  4/5 via ostomy  Height:   Ht Readings from Last 1 Encounters:  01/19/16 5\' 7"  (1.702 m)    Weight: Noted new wt on 4/4 of 158, previous wt of 127 on 3/20.  Fairly stable wt prior to admission per wt encounters  Wt Readings from Last 1 Encounters:  01/27/16 158 lb 12.8 oz (72.031 kg)   Wt Readings from Last 10 Encounters:  01/27/16  158 lb 12.8 oz (72.031 kg)  01/12/16  127 lb (57.607 kg)  07/10/15  140 lb (63.504 kg)  07/09/15  140 lb (63.504 kg)  05/16/15  136 lb 9.6 oz (61.961 kg)  04/02/15  138 lb (62.596 kg)  07/02/14  142 lb (64.411 kg)  Ideal Body Weight:     BMI:  Body mass index is 24.87 kg/(m^2).  Estimated Nutritional Needs:   Kcal:  1450-1740 kcals/d  Protein:  (1.0-1.2 g/kg) 58-70 g/d  Fluid:  1450-174340ml/d  EDUCATION NEEDS:   Education needs no appropriate at this time  Laverda Stribling B. Freida BusmanAllen, RD, LDN [phone]  (pager) Weekend/On-Call pager [phone])

## 2016-01-28 NOTE — Progress Notes (Signed)
PT Cancellation Note  Patient Details Name: Rueben BashRosemarie Z Quinteros MRN: [id number] DOB: 01/14/1941   Cancelled Treatment:    Reason Eval/Treat Not Completed: Other (comment);Medical issues which prohibited therapy. Pt underwent a procedure for intrabdominal abscess drain placement yesterday and per Dr. Hurman HornPabon's nurse was under general anesthesia. This requires new PT orders to continue therapy. Pt's floor RN made aware of the need for new orders. PT will f/u and resume PT when new orders are placed   Lanis Storlie Lucillie GarfinkelJo Ashyra Cantin, PT, DPT  01/28/2016, 2:12 PM [phone]

## 2016-01-28 NOTE — Progress Notes (Signed)
POD # 10 s/p Hartman's her for a diverticulitis and purulent peritonitis S/p perc drains by IR yesterday, some purulent/ ? Enteric contents, with collapse of cavities after drain PT feels better , ostomy w good out put Drain 365 cc Good U/O WBC normal and hb stable.  AVSS  PE NAD Abd: soft, NT, midline wound healing well, no infection. Drains w purulent drainage and some green content No peritonitis  A/P POD # 10 now w abscess vs fistula process. In any case source is controlled, she has had draiange of collections. Plan is to keep her A/Bs, start TPN given her Poor PO intake, We will rescan her in a few days to evaluate the need for further draiange. No surgical indication at this time. Will likely require LTAC but not there yet. Will allow her to have clears. Expected prolonged course, d/w husband in detail

## 2016-01-28 NOTE — Consult Note (Signed)
PARENTERAL NUTRITION CONSULT NOTE - INITIAL  Pharmacy Consult for electrolytes/glucose Indication: TPN  Allergies  Allergen Reactions  . Galantamine Other (See Comments)    Patient Measurements: Height: _0  (170.2 cm) Weight: 158 lb 12.8 oz (72.031 kg) IBW/kg (Calculated) : 61.6 Adjusted Body Weight:  Usual Weight:   Vital Signs: Temp: 98.6 F (37 C) (04/05 0835) Temp Source: Oral (04/05 0835) BP: 116/61 mmHg (04/05 0835) Pulse Rate: 79 (04/05 0835) Intake/Output from previous day: 04/04 0701 - 04/05 0700 In: 1557 [I.V.:1528; IV Piggyback:29] Out: 1640 [Urine:1100; Drains:365; Stool:175] Intake/Output from this shift: Total I/O In: 102.9 [I.V.:92.9; Other:10] Out: 600 [Urine:600]  Labs:  Recent Labs  01/26/16 0505 01/27/16 0801 01/28/16 0524  WBC 10.5 9.8 9.3  HGB 9.0* 9.8* 9.6*  HCT 26.4* 29.1* 28.1*  PLT 344 412 486*     Recent Labs  01/26/16 0505 01/26/16 1557 01/27/16 0801 01/28/16 0524  NA 136  --  136 137  K 2.7* 3.4* 4.0 3.7  CL 106  --  107 108  CO2 26  --  24 25  GLUCOSE 96  --  100* 99  BUN 14  --  13 9  CREATININE 0.69  --  0.58 0.67  CALCIUM 7.3*  --  7.5* 7.3*  MG 1.9  --  1.7 1.9  PHOS 3.5  --   --   --    Estimated Creatinine Clearance: 60.9 mL/min (by C-G formula based on Cr of 0.67).   No results for input(s): GLUCAP in the last 72 hours.  Medical History: Past Medical History  Diagnosis Date  . Allergy   . Depression   . Hyperlipidemia   . Anxiety   . Osteoporosis     Medications:  Scheduled:  . antiseptic oral rinse  7 mL Mouth Rinse QID  . chlorhexidine gluconate (SAGE KIT)  15 mL Mouth Rinse BID  . donepezil  10 mg Oral QHS  . enoxaparin (LOVENOX) injection  40 mg Subcutaneous Q24H  . ertapenem  1 g Intravenous Q24H  . metoprolol tartrate  12.5 mg Oral BID  . metronidazole  500 mg Intravenous Q8H  . pantoprazole (PROTONIX) IV  40 mg Intravenous QHS    Insulin Requirements in the past 24 hours:   0  Current Nutrition:  Clinimix E 5/20 to start tonight at 1800  Assessment: K=3.7 Mg=19   Plan:  Electrolytes are WNL. Will check mg, K, phos in the AM. CBG q 4hr and SSI sensitive has been ordered.  Pharmacy to continue to monitor Thank you for allowing pharmacy to be involved in this patient's care.  Ramond Dial, Pharm.D Clinical Pharmacist   01/28/2016,11:02 AM

## 2016-01-28 NOTE — Progress Notes (Signed)
Pt stated to RN that she "really wants to sleep and wanted to get some rest" Doctor Anne HahnWillis was notified of pt concern and gave RN new orders to give Benadryl 25 mg PO one time. Will continue to monitor pt.   Karsten RoLauren E Hobbs

## 2016-01-28 NOTE — Progress Notes (Signed)
Peripherally Inserted Central Catheter/Midline Placement  The IV Nurse has discussed with the patient and/or persons authorized to consent for the patient, the purpose of this procedure and the potential benefits and risks involved with this procedure.  The benefits include less needle sticks, lab draws from the catheter and patient may be discharged home with the catheter.  Risks include, but not limited to, infection, bleeding, blood clot (thrombus formation), and puncture of an artery; nerve damage and irregular heat beat.  Alternatives to this procedure were also discussed.  PICC/Midline Placement Documentation  PICC Double Lumen 01/28/16 PICC Right Basilic 35 cm 0 cm (Active)  Indication for Insertion or Continuance of Line Administration of hyperosmolar/irritating solutions (i.e. TPN, Vancomycin, etc.) 01/28/2016 11:00 AM  Exposed Catheter (cm) 0 cm 01/28/2016 11:00 AM  Dressing Change Due 02/04/16 01/28/2016 11:00 AM       Stacie GlazeJoyce, Mallary Kreger Horton 01/28/2016, 11:37 AM

## 2016-01-28 NOTE — Consult Note (Signed)
WOC ostomy follow up Stoma type/location: LLQ Colostomy  Pouch change with spouse today.  Stomal assessment/size: 1" slightly budded.  Uneven abdominal plane from 12 to 6 o'clock.  Will add barrier ring for improved fit.  Peristomal assessment: INtact.  Barrier ring Treatment options for stomal/peristomal skin: Barrier ring Output Loose brown stool. Ostomy pouching: 2pc. 2 1/4" pouch with barrier ring  Education provided: Spouse able to measure, cut barrier to fit and apply system today.  Understands rationale for barrier ring and twice weekly changes.  Knows to empty when 1/3 full.  Spouse is gaining confidence in ostomy care.  Patient has dementia and unable to participate in meaningful self care. Enrolled patient in [place] [facility] [place] Discharge program: Yes today Will not follow at this time.  Please re-consult if needed.  Maple HudsonKaren Marguita Venning RN BSN CWON Pager [phone]

## 2016-01-28 NOTE — Consult Note (Signed)
MEDICATION RELATED CONSULT NOTE - Follow up   Pharmacy Consult for electrolyte management Indication: hypokalemia, hypomagnesemia  Allergies  Allergen Reactions  . Galantamine Other (See Comments)    Assessment: K 3.7, Mag 1.9 Scr 0.67, CrCl ~60 ml/min   Plan:  Electrolytes are WNL. No supplementation needed. Will recheck in AM.  Pharmacy will continue to follow.   Olene FlossMelissa D Victorio Creeden, Pharm.D Clinical Pharmacist   01/28/2016 8:13 AM

## 2016-01-29 LAB — GLUCOSE, CAPILLARY
GLUCOSE-CAPILLARY: 134 mg/dL — AB (ref 65–99)
GLUCOSE-CAPILLARY: 136 mg/dL — AB (ref 65–99)
GLUCOSE-CAPILLARY: 97 mg/dL (ref 65–99)
Glucose-Capillary: 119 mg/dL — ABNORMAL HIGH (ref 65–99)
Glucose-Capillary: 116 mg/dL — ABNORMAL HIGH (ref 65–99)
Glucose-Capillary: 140 mg/dL — ABNORMAL HIGH (ref 65–99)

## 2016-01-29 LAB — COMPREHENSIVE METABOLIC PANEL
ALT: 10 U/L — ABNORMAL LOW (ref 14–54)
ANION GAP: 4 — AB (ref 5–15)
AST: 25 U/L (ref 15–41)
Albumin: 1.3 g/dL — ABNORMAL LOW (ref 3.5–5.0)
Alkaline Phosphatase: 93 U/L (ref 38–126)
BILIRUBIN TOTAL: 0.3 mg/dL (ref 0.3–1.2)
BUN: 9 mg/dL (ref 6–20)
CHLORIDE: 106 mmol/L (ref 101–111)
CO2: 26 mmol/L (ref 22–32)
Calcium: 7.1 mg/dL — ABNORMAL LOW (ref 8.9–10.3)
Creatinine, Ser: 0.53 mg/dL (ref 0.44–1.00)
Glucose, Bld: 134 mg/dL — ABNORMAL HIGH (ref 65–99)
POTASSIUM: 3.1 mmol/L — AB (ref 3.5–5.1)
Sodium: 136 mmol/L (ref 135–145)
TOTAL PROTEIN: 4.4 g/dL — AB (ref 6.5–8.1)

## 2016-01-29 LAB — TRIGLYCERIDES: TRIGLYCERIDES: 111 mg/dL (ref <150)

## 2016-01-29 LAB — PHOSPHORUS: PHOSPHORUS: 3.1 mg/dL (ref 2.5–4.6)

## 2016-01-29 LAB — POTASSIUM: POTASSIUM: 3.5 mmol/L (ref 3.5–5.1)

## 2016-01-29 LAB — MAGNESIUM: MAGNESIUM: 1.8 mg/dL (ref 1.7–2.4)

## 2016-01-29 MED ORDER — DEXTROSE IN LACTATED RINGERS 5 % IV SOLN
INTRAVENOUS | Status: DC
  Administered 2016-01-29 – 2016-01-30 (×2): via INTRAVENOUS

## 2016-01-29 MED ORDER — POTASSIUM CHLORIDE 10 MEQ/100ML IV SOLN
10.0000 meq | INTRAVENOUS | Status: AC
  Administered 2016-01-29 (×4): 10 meq via INTRAVENOUS
  Filled 2016-01-29 (×4): qty 100

## 2016-01-29 MED ORDER — TRACE MINERALS CR-CU-MN-SE-ZN 10-1000-500-60 MCG/ML IV SOLN
INTRAVENOUS | Status: AC
  Administered 2016-01-29: 18:00:00 via INTRAVENOUS
  Filled 2016-01-29: qty 1440

## 2016-01-29 MED ORDER — PIPERACILLIN-TAZOBACTAM 3.375 G IVPB 30 MIN
3.3750 g | Freq: Three times a day (TID) | INTRAVENOUS | Status: DC
  Administered 2016-01-29 – 2016-02-03 (×15): 3.375 g via INTRAVENOUS
  Filled 2016-01-29 (×19): qty 50

## 2016-01-29 NOTE — Consult Note (Signed)
PARENTERAL NUTRITION CONSULT NOTE - FOLLOW UP   Pharmacy Consult for electrolytes/glucose Indication: TPN  Allergies  Allergen Reactions  . Galantamine Other (See Comments)    Patient Measurements: Height: _0  (170.2 cm) Weight: 163 lb (73.936 kg) IBW/kg (Calculated) : 61.6 Adjusted Body Weight:  Usual Weight:   Vital Signs: Temp: 97.5 F (36.4 C) (04/06 0351) Temp Source: Oral (04/06 0351) BP: 137/70 mmHg (04/06 0351) Pulse Rate: 82 (04/06 0351) Intake/Output from previous day: 04/05 0701 - 04/06 0700 In: 2607.2 [P.O.:360; I.V.:1870; IV Piggyback:47.2; TPN:320] Out: 3435 [Urine:3050; Drains:210; Stool:175] Intake/Output from this shift: Total I/O In: 220 [P.O.:30; I.V.:144.9; TPN:45.1] Out: 0   Labs:  Recent Labs  01/27/16 0801 01/28/16 0524  WBC 9.8 9.3  HGB 9.8* 9.6*  HCT 29.1* 28.1*  PLT 412 486*     Recent Labs  01/27/16 0801 01/28/16 0524 01/29/16 0434  NA 136 137 136  K 4.0 3.7 3.1*  CL 107 108 106  CO2 _1 GLUCOSE 100* 99 134*  BUN _2 CREATININE 0.58 0.67 0.53  CALCIUM 7.5* 7.3* 7.1*  MG 1.7 1.9 1.8  PHOS  --   --  3.1  PROT  --   --  4.4*  ALBUMIN  --   --  1.3*  AST  --   --  25  ALT  --   --  10*  ALKPHOS  --   --  93  BILITOT  --   --  0.3  TRIG  --   --  111   Estimated Creatinine Clearance: 60.9 mL/min (by C-G formula based on Cr of 0.53).    Recent Labs  01/28/16 2042 01/29/16 0020 01/29/16 0412  GLUCAP 113* 134* 136*    Medical History: Past Medical History  Diagnosis Date  . Allergy   . Depression   . Hyperlipidemia   . Anxiety   . Osteoporosis     Medications:  Scheduled:  . antiseptic oral rinse  7 mL Mouth Rinse QID  . chlorhexidine gluconate (SAGE KIT)  15 mL Mouth Rinse BID  . donepezil  10 mg Oral QHS  . enoxaparin (LOVENOX) injection  40 mg Subcutaneous Q24H  . ertapenem  1 g Intravenous Q24H  . insulin aspart  0-9 Units Subcutaneous 6 times per day  . metoprolol tartrate  12.5 mg  Oral BID  . metronidazole  500 mg Intravenous Q8H  . pantoprazole (PROTONIX) IV  40 mg Intravenous QHS  . potassium chloride  10 mEq Intravenous Q1 Hr x 4  . sodium chloride flush  10-40 mL Intracatheter Q12H    Insulin Requirements in the past 24 hours:  0  Current Nutrition:  Clinimix E 5/20 to start tonight at 1800  Assessment: K=3.1    Plan:  Will give KCl 10 mEq IV x 4. Will order K to be rechecked @ 14:00.   Pharmacy to continue to monitor Thank you for allowing pharmacy to be involved in this patient's care.  Larene Beach, PharmD  Clinical Pharmacist   01/29/2016,8:06 AM

## 2016-01-29 NOTE — Care Management (Signed)
Dr. Everlene FarrierPabon is in agreement for LTACH to be pursued.  I have discussed this option with the patient and her husband.  They are both open to looking further into the option.  [facility] is not able to offer due to the patient not having a 3 night stay in CCU.  Sue Lushndrea from [facility] to meet with patient and husband.

## 2016-01-29 NOTE — Progress Notes (Signed)
PT Cancellation Note  Patient Details Name: Katherine Wood MRN: [id number] DOB: 1941/09/25   Cancelled Treatment:    Reason Eval/Treat Not Completed: Medical issues which prohibited therapy. Pt's chart reviewed and her current potassium is 3.1 which is below the cut off of 3.2 to participate in therapy. PT will f/u at a later time and resume therapy when appropriate.   Adelene IdlerMindy Jo Dali Kraner, PT, DPT  01/29/2016, 10:39 AM [phone]

## 2016-01-29 NOTE — Progress Notes (Signed)
Nutrition Follow-up  DOCUMENTATION CODES:   Severe malnutrition in context of acute illness/injury  INTERVENTION:  -Spoke with Dr. Everlene FarrierPabon and agreeable to increasing TPN to goal rate of 560ml/hr at 18:00. MD wanting IV fluids to decrease to 940ml/hr when TPN increased. Spoke with RN, Selena BattenKim.     NUTRITION DIAGNOSIS:   Inadequate oral intake related to altered GI function as evidenced by NPO status.    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   TPN infusing via Right Double lumen PICC line Medications reviewed: D5 in LR at 8175ml/hr, KCL Labs reviewed: K 3.1, Calcium 7.1, glucose 134,  FSBS 119, 136, 134, TG 111 UOP: 3050ml last 24 hr  Nutrition-Focused physical exam completed. Findings are no fat depletion, mild/moderate muscle depletion, and  Mild edema.     Diet Order:  Diet clear liquid Room service appropriate?: Yes; Fluid consistency:: Thin .TPN (CLINIMIX-E) Adult  Skin:  Reviewed, no issues  Last BM:  4/6 (100ml via ostomy)  Height:   Ht Readings from Last 1 Encounters:  01/19/16 5\' 7"  (1.702 m)    Weight:   Wt Readings from Last 1 Encounters:  01/29/16 163 lb (73.936 kg)    Ideal Body Weight:     BMI:  Body mass index is 25.52 kg/(m^2).  Estimated Nutritional Needs:   Kcal:  1450-1740 kcals/d  Protein:  (1.0-1.2 g/kg) 58-70 g/d  Fluid:  1450-173240ml/d  EDUCATION NEEDS:   Education needs no appropriate at this time  Nahima Ales B. Freida BusmanAllen, RD, LDN [phone] (pager) Weekend/On-Call pager [phone])

## 2016-01-29 NOTE — Consult Note (Signed)
PARENTERAL NUTRITION CONSULT NOTE - FOLLOW UP   Pharmacy Consult for electrolytes/glucose Indication: TPN  Allergies  Allergen Reactions  . Galantamine Other (See Comments)    Patient Measurements: Height: 5\' 7"  (170.2 cm) Weight: 163 lb (73.936 kg) IBW/kg (Calculated) : 61.6   Vital Signs:   Intake/Output from previous day: 04/05 0701 - 04/06 0700 In: 2607.2 [P.O.:360; I.V.:1870; IV Piggyback:47.2; TPN:320] Out: 3435 [Urine:3050; Drains:210; Stool:175] Intake/Output from this shift: Total I/O In: 1353.6 [P.O.:390; I.V.:505.5; IV Piggyback:265.9; TPN:192.2] Out: 1355 [Urine:1050; Drains:55; Stool:250]  Labs:  Recent Labs  01/27/16 0801 01/28/16 0524  WBC 9.8 9.3  HGB 9.8* 9.6*  HCT 29.1* 28.1*  PLT 412 486*     Recent Labs  01/27/16 0801 01/28/16 0524 01/29/16 0434 01/29/16 1430  NA 136 137 136  --   K 4.0 3.7 3.1* 3.5  CL 107 108 106  --   CO2 24 25 26   --   GLUCOSE 100* 99 134*  --   BUN 13 9 9   --   CREATININE 0.58 0.67 0.53  --   CALCIUM 7.5* 7.3* 7.1*  --   MG 1.7 1.9 1.8  --   PHOS  --   --  3.1  --   PROT  --   --  4.4*  --   ALBUMIN  --   --  1.3*  --   AST  --   --  25  --   ALT  --   --  10*  --   ALKPHOS  --   --  93  --   BILITOT  --   --  0.3  --   TRIG  --   --  111  --    Estimated Creatinine Clearance: 60.9 mL/min (by C-G formula based on Cr of 0.53).    Recent Labs  01/29/16 0412 01/29/16 0816 01/29/16 1157  GLUCAP 136* 119* 116*   Insulin Requirements in the past 24 hours:  0  Current Nutrition:  Clinimix E 5/20 to increase from 30 ml/hr to 60 ml/hr tonight at 1800  Assessment: K=3.1 --> KCl 10 mEq IV x4 doses --> K 3.5   Plan:  K now WNL with TPN rate increasing tonight. Will follow up labs in AM  Crist FatHannah Agam Davenport, PharmD, BCPS Clinical Pharmacist 01/29/2016 3:59 PM

## 2016-01-29 NOTE — Care Management Important Message (Signed)
Important Message  Patient Details  Name: Katherine Wood MRN: [id number] Date of Birth: 10-17-1941   Medicare Important Message Given:  Yes    Olegario MessierKathy A Jdyn Parkerson 01/29/2016, 10:22 AM

## 2016-01-29 NOTE — Evaluation (Addendum)
Physical Therapy RE-Evaluation Patient Details Name: Katherine Wood MRN: [id number] DOB: November 23, 1940 Today's Date: 01/29/2016   History of Present Illness  75 yo F presented to ED due to abdominal pain and diarrhea, found to have a bowel perforation, hernia of abdominal cavity and diverticulitis. PMH includes depression, anxiety, osteoporosis, and HLD. She has an exploratory laparotomy on 3/26 with repairs and colostomy bag placement. She was extubated on 3/27. Hospital stay complicated secondary to abscess and multiple collections. Pt is now s/p Sx for intrabdominal abscess drain placement on 01/27/16.  Clinical Impression  Pt is a pleasant 75 year old female who was admitted for abdominal pain/diarrhea. Pt is now POD 10 from Hartman's Sx and diverticulitis and hospital stay complicated by abscess. Most recent surgery on 01/27/16 for drain placement. Pt performs bed mobility with mod assist, transfers with mod assist, and ambulation with min assist and rw. Pt very anxious once sitting in recliner and is requesting pain meds form RN. RN notified. Pt demonstrates deficits with strength/balance/endurance/cognition. Would benefit from skilled PT to address above deficits and promote optimal return to PLOF; recommend transition to STR upon discharge from acute hospitalization.       Follow Up Recommendations SNF    Equipment Recommendations  Rolling walker with 5" wheels    Recommendations for Other Services       Precautions / Restrictions Precautions Precautions: Fall Restrictions Weight Bearing Restrictions: No      Mobility  Bed Mobility Overal bed mobility: Needs Assistance Bed Mobility: Supine to Sit     Supine to sit: Mod assist     General bed mobility comments: assist for trunk stability and scooting out towards EOB. Once seated, pt with slight R side trunk lean, however is able to self correct with cga. Pt complains of dizziness with upright posture.  Transfers Overall  transfer level: Needs assistance Equipment used: Rolling walker (2 wheeled) Transfers: Sit to/from Stand Sit to Stand: Mod assist         General transfer comment: cues for hand placement and sequencing of transfer. Once standing, pt with upright posture. Pt with increased pain with all mobility  Ambulation/Gait Ambulation/Gait assistance: Min assist Ambulation Distance (Feet): 3 Feet Assistive device: Rolling walker (2 wheeled) Gait Pattern/deviations: Step-to pattern     General Gait Details: ambulated with rw and min assist. Heavy cues for stepping and sequencing. Safe technique performed. All mobility performed on room air. Pt very anxious with ambulation and fearful of movement.  Stairs            Wheelchair Mobility    Modified Rankin (Stroke Patients Only)       Balance Overall balance assessment: Needs assistance Sitting-balance support: Bilateral upper extremity supported;Feet supported Sitting balance-Leahy Scale: Fair Sitting balance - Comments: decreased trunk stability with abdominal pain   Standing balance support: Bilateral upper extremity supported Standing balance-Leahy Scale: Fair                               Pertinent Vitals/Pain Pain Assessment: Faces Faces Pain Scale: Hurts even more Pain Location: abdomen Pain Descriptors / Indicators: Operative site guarding Pain Intervention(s): Limited activity within patient's tolerance    Home Living Family/patient expects to be discharged to:: Private residence Living Arrangements: Spouse/significant other Available Help at Discharge: Family Type of Home: House Home Access: Stairs to enter Entrance Stairs-Rails: None Entrance Stairs-Number of Steps: 2 Home Layout: Two level;Bed/bath upstairs;Full bath on main level  Home Equipment: None      Prior Function Level of Independence: Independent         Comments: Pt reported being I with ADLs, ambulation and driving. She was  walking her dogs daily. Husband assists with household duties.     Hand Dominance        Extremity/Trunk Assessment   Upper Extremity Assessment: Generalized weakness (grossly 4/5)           Lower Extremity Assessment: Generalized weakness (grossly 4/5)         Communication   Communication: No difficulties  Cognition Arousal/Alertness: Awake/alert Behavior During Therapy: Anxious Overall Cognitive Status: Impaired/Different from baseline                      General Comments      Exercises Other Exercises Other Exercises: B LE ther-ex performed on B LE including ankle pumps, SLRs, and hip abd/add. All ther-ex performed x 10 reps with min assist for correct technique      Assessment/Plan    PT Assessment Patient needs continued PT services  PT Diagnosis Difficulty walking;Generalized weakness;Acute pain   PT Problem List Decreased strength;Decreased activity tolerance;Decreased balance;Decreased mobility;Decreased knowledge of use of DME;Cardiopulmonary status limiting activity;Decreased skin integrity;Pain  PT Treatment Interventions DME instruction;Gait training;Stair training;Therapeutic activities;Therapeutic exercise;Balance training;Neuromuscular re-education;Patient/family education   PT Goals (Current goals can be found in the Care Plan section) Acute Rehab PT Goals Patient Stated Goal: to do whatever it takes PT Goal Formulation: With patient Time For Goal Achievement: 02/12/16 Potential to Achieve Goals: Good    Frequency Min 2X/week   Barriers to discharge        Co-evaluation               End of Session Equipment Utilized During Treatment: Gait belt Activity Tolerance: Patient limited by pain Patient left: in chair;with chair alarm set Nurse Communication: Mobility status         Time: [id number] PT Time Calculation (min) (ACUTE ONLY): 21 min   Charges:   PT Evaluation $PT Re-evaluation: 1 Procedure PT  Treatments $Therapeutic Exercise: 8-22 mins   PT G Codes:        [name] 01/29/2016, 5:16 PM  Elizabeth PalauStephanie Donzella Carrol, PT, DPT [phone]

## 2016-01-29 NOTE — Progress Notes (Signed)
S/p hartman's w abscess vs fistula s/p perc drain Cultures grew pseudomonas switched to zosyn Clinically doing very well Tolerating PO and drains around 200cc  VSS PE NAD Abd: soft, NT. Drains purulent content. No peritonitis. Wound healing well  A/P continue current A/bs , TPn , clears LTAC early next week May repeat Ct in a few days to ensure all collections are drained.

## 2016-01-30 DIAGNOSIS — E43 Unspecified severe protein-calorie malnutrition: Secondary | ICD-10-CM | POA: Insufficient documentation

## 2016-01-30 LAB — BASIC METABOLIC PANEL
ANION GAP: 0 — AB (ref 5–15)
BUN: 9 mg/dL (ref 6–20)
CALCIUM: 6.9 mg/dL — AB (ref 8.9–10.3)
CO2: 26 mmol/L (ref 22–32)
CREATININE: 0.54 mg/dL (ref 0.44–1.00)
Chloride: 106 mmol/L (ref 101–111)
GFR calc Af Amer: >60 mL/min (ref >60)
GFR calc non Af Amer: >60 mL/min (ref >60)
GLUCOSE: 178 mg/dL — AB (ref 65–99)
Potassium: 3.2 mmol/L — ABNORMAL LOW (ref 3.5–5.1)
Sodium: 132 mmol/L — ABNORMAL LOW (ref 135–145)

## 2016-01-30 LAB — PHOSPHORUS: Phosphorus: 3.6 mg/dL (ref 2.5–4.6)

## 2016-01-30 LAB — GLUCOSE, CAPILLARY
GLUCOSE-CAPILLARY: 104 mg/dL — AB (ref 65–99)
GLUCOSE-CAPILLARY: 107 mg/dL — AB (ref 65–99)
GLUCOSE-CAPILLARY: 116 mg/dL — AB (ref 65–99)
GLUCOSE-CAPILLARY: 124 mg/dL — AB (ref 65–99)
GLUCOSE-CAPILLARY: 127 mg/dL — AB (ref 65–99)
GLUCOSE-CAPILLARY: 133 mg/dL — AB (ref 65–99)
Glucose-Capillary: 117 mg/dL — ABNORMAL HIGH (ref 65–99)

## 2016-01-30 LAB — CBC
HCT: 26.1 % — ABNORMAL LOW (ref 35.0–47.0)
HEMOGLOBIN: 8.6 g/dL — AB (ref 12.0–16.0)
MCH: 30 pg (ref 26.0–34.0)
MCHC: 33.1 g/dL (ref 32.0–36.0)
MCV: 90.6 fL (ref 80.0–100.0)
PLATELETS: 540 10*3/uL — AB (ref 150–440)
RBC: 2.88 MIL/uL — ABNORMAL LOW (ref 3.80–5.20)
RDW: 15.1 % — AB (ref 11.5–14.5)
WBC: 13.9 10*3/uL — ABNORMAL HIGH (ref 3.6–11.0)

## 2016-01-30 LAB — MAGNESIUM
MAGNESIUM: 1.6 mg/dL — AB (ref 1.7–2.4)
Magnesium: 1.7 mg/dL (ref 1.7–2.4)

## 2016-01-30 LAB — POTASSIUM: Potassium: 3.5 mmol/L (ref 3.5–5.1)

## 2016-01-30 MED ORDER — VANCOMYCIN HCL IN DEXTROSE 1-5 GM/200ML-% IV SOLN
1000.0000 mg | Freq: Once | INTRAVENOUS | Status: AC
  Administered 2016-01-30: 1000 mg via INTRAVENOUS
  Filled 2016-01-30: qty 200

## 2016-01-30 MED ORDER — VANCOMYCIN HCL 10 G IV SOLR
1250.0000 mg | INTRAVENOUS | Status: DC
  Administered 2016-01-30 – 2016-01-31 (×2): 1250 mg via INTRAVENOUS
  Filled 2016-01-30 (×2): qty 1250

## 2016-01-30 MED ORDER — ZOLPIDEM TARTRATE 5 MG PO TABS
5.0000 mg | ORAL_TABLET | Freq: Every evening | ORAL | Status: DC | PRN
  Administered 2016-01-30 – 2016-02-13 (×12): 5 mg via ORAL
  Filled 2016-01-30 (×12): qty 1

## 2016-01-30 MED ORDER — DIPHENHYDRAMINE HCL 25 MG PO CAPS: 25.0000 mg | ORAL_CAPSULE | Freq: Every evening | ORAL | Status: DC | PRN

## 2016-01-30 MED ORDER — MAGNESIUM SULFATE 2 GM/50ML IV SOLN
2.0000 g | Freq: Once | INTRAVENOUS | Status: AC
  Administered 2016-01-30: 2 g via INTRAVENOUS
  Filled 2016-01-30: qty 50

## 2016-01-30 MED ORDER — TRACE MINERALS CR-CU-MN-SE-ZN 10-1000-500-60 MCG/ML IV SOLN
INTRAVENOUS | Status: AC
  Administered 2016-01-30: 19:00:00 via INTRAVENOUS
  Filled 2016-01-30: qty 1440

## 2016-01-30 MED ORDER — POTASSIUM CHLORIDE 10 MEQ/100ML IV SOLN
10.0000 meq | INTRAVENOUS | Status: AC
  Administered 2016-01-30 (×4): 10 meq via INTRAVENOUS
  Filled 2016-01-30 (×4): qty 100

## 2016-01-30 MED ORDER — VANCOMYCIN HCL IN DEXTROSE 1-5 GM/200ML-% IV SOLN: 1000.0000 mg | Freq: Two times a day (BID) | INTRAVENOUS | Status: DC

## 2016-01-30 MED ORDER — BOOST / RESOURCE BREEZE PO LIQD
1.0000 | Freq: Three times a day (TID) | ORAL | Status: DC
  Administered 2016-01-30 (×2): 1 via ORAL

## 2016-01-30 NOTE — Progress Notes (Signed)
Central telemetry notified RN of pt. Running 7 beats of VT, with HR 140. Prime doctor was notified, no new orders given at this time, continue to monitor pt. Current HR 96.

## 2016-01-30 NOTE — NC FL2 (Signed)
[facility] LEVEL OF CARE SCREENING TOOL     IDENTIFICATION  Patient Name: Katherine Wood Birthdate: 11/05/1940 Sex: female Admission Date (Current Location): 01/18/2016  [place] and [place] Number:  Engineering geologist and Address:  [facility], [address] [place], [place] [address]      Provider Number: [id number]  Attending Physician Name and Address:  Jules Husbands, MD  Relative Name and Phone Number:       Current Level of Care: Hospital Recommended Level of Care: [place] Prior Approval Number:    Date Approved/Denied:   PASRR Number:    Discharge Plan: SNF    Current Diagnoses: Patient Active Problem List   Diagnosis Date Noted  . Protein-calorie malnutrition, severe 01/30/2016  . Diverticulitis of colon with perforation 01/19/2016  . Bowel perforation ([place])   . Hernia of abdominal cavity   . Dementia in Alzheimer's disease with late onset 07/27/2015  . Memory loss 05/16/2015  . Arthritis 02/13/2015  . Narrowing of intervertebral disc space 02/13/2015  . Clinical depression 02/13/2015  . Hypercholesteremia 02/13/2015  . Amnesia 02/13/2015  . Symptomatic menopausal or female climacteric states 02/13/2015  . Arthritis, degenerative 02/13/2015  . Avitaminosis D 02/13/2015  . Connective tissue and disc stenosis of intervertebral foramina of abdomen and other regions 02/13/2015  . Bone/cartilage disorder 08/15/2009  . Anxiety state 10/31/2008  . Arthropathia 10/30/2008  . Leg varices 10/30/2008  . Acquired cyst of kidney 10/30/2008  . Cannot sleep 10/30/2008  . OP (osteoporosis) 10/30/2008  . Cystocele, midline 10/30/2008  . Involutional osteoporosis 10/30/2008    Orientation RESPIRATION BLADDER Height & Weight     Self, Place  Normal Continent Weight: 152 lb 4.8 oz (69.083 kg) Height:  _0  (170.2 cm)  BEHAVIORAL SYMPTOMS/MOOD NEUROLOGICAL BOWEL NUTRITION STATUS   (none)  (none)  Continent Diet (clear liquid)  AMBULATORY STATUS COMMUNICATION OF NEEDS Skin   Limited Assist Verbally Surgical wounds                       Personal Care Assistance Level of Assistance  Bathing, Dressing Bathing Assistance: Limited assistance   Dressing Assistance: Limited assistance     Functional Limitations Info  Hearing, Sight Sight Info: Impaired Hearing Info: Impaired      SPECIAL CARE FACTORS FREQUENCY  PT (By licensed PT)                    Contractures Contractures Info: Not present    Additional Factors Info  Code Status, Allergies Code Status Info: Full Allergies Info: Galantamine           Current Medications (01/30/2016):  This is the current hospital active medication list Current Facility-Administered Medications  Medication Dose Route Frequency Provider Last Rate Last Dose  . Marland KitchenTPN (CLINIMIX-E) Adult   Intravenous Continuous TPN Jules Husbands, MD 60 mL/hr at 01/29/16 1801    . Marland KitchenTPN (CLINIMIX-E) Adult   Intravenous Continuous TPN Diego F Pabon, MD      . acetaminophen (TYLENOL) tablet 650 mg  650 mg Oral Q6H PRN Hubbard Robinson, MD   650 mg at 01/30/16 1319  . antiseptic oral rinse solution (CORINZ)  7 mL Mouth Rinse QID Rush Farmer, MD   7 mL at 01/30/16 1723  . chlorhexidine gluconate (SAGE KIT) (PERIDEX) 0.12 % solution 15 mL  15 mL Mouth Rinse BID Rush Farmer, MD   15 mL at 01/30/16  4069  . donepezil (ARICEPT) tablet 10 mg  10 mg Oral QHS Hubbard Robinson, MD   10 mg at 01/29/16 1815  . enoxaparin (LOVENOX) injection 40 mg  40 mg Subcutaneous Q24H Inez Catalina, MD   40 mg at 01/29/16 2237  . feeding supplement (BOOST / RESOURCE BREEZE) liquid 1 Container  1 Container Oral TID BM Jules Husbands, MD   1 Container at 01/30/16 1545  . haloperidol lactate (HALDOL) injection 2 mg  2 mg Intravenous Q6H PRN Hubbard Robinson, MD   2 mg at 01/20/16 2344  . HYDROcodone-acetaminophen (NORCO/VICODIN) 5-325 MG per tablet 2 tablet  2 tablet Oral  Q4H PRN Hubbard Robinson, MD   2 tablet at 01/30/16 0244  . insulin aspart (novoLOG) injection 0-9 Units  0-9 Units Subcutaneous 6 times per day Jules Husbands, MD   1 Units at 01/30/16 0817  . magnesium sulfate IVPB 2 g 50 mL  2 g Intravenous Once Jules Husbands, MD   2 g at 01/30/16 1657  . metoprolol tartrate (LOPRESSOR) tablet 12.5 mg  12.5 mg Oral BID Florene Glen, MD   12.5 mg at 01/30/16 0942  . metroNIDAZOLE (FLAGYL) IVPB 500 mg  500 mg Intravenous Q8H Robert Bellow, MD   500 mg at 01/30/16 1151  . morphine 2 MG/ML injection 2 mg  2 mg Intravenous Q4H PRN Hubbard Robinson, MD   2 mg at 01/28/16 0448  . ondansetron (ZOFRAN) injection 4 mg  4 mg Intravenous Q4H PRN Hubbard Robinson, MD      . pantoprazole (PROTONIX) injection 40 mg  40 mg Intravenous QHS Jules Husbands, MD   40 mg at 01/29/16 2237  . piperacillin-tazobactam (ZOSYN) IVPB 3.375 g  3.375 g Intravenous 3 times per day Jules Husbands, MD   3.375 g at 01/30/16 1317  . sodium chloride flush (NS) 0.9 % injection 10-40 mL  10-40 mL Intracatheter Q12H Jules Husbands, MD   10 mL at 01/30/16 0943  . sodium chloride flush (NS) 0.9 % injection 10-40 mL  10-40 mL Intracatheter PRN Diego F Pabon, MD      . vancomycin (VANCOCIN) 1,250 mg in sodium chloride 0.9 % 250 mL IVPB  1,250 mg Intravenous Q18H Jules Husbands, MD   1,250 mg at 01/30/16 1657     Discharge Medications: Please see discharge summary for a list of discharge medications.  Relevant Imaging Results:  Relevant Lab Results:   Additional Information SS: [id number]  [name], LCSW

## 2016-01-30 NOTE — Progress Notes (Signed)
Nutrition Follow-up  DOCUMENTATION CODES:   Severe malnutrition in context of acute illness/injury  INTERVENTION:  -monitor intake and diet progression -Recommend adding boost breeze TID for added nutrition -Spoke with Dr. Everlene FarrierPabon regarding IV fluids and Na level.  MD wanting to discontinue IV fluids at this time   NUTRITION DIAGNOSIS:   Inadequate oral intake related to altered GI function as evidenced by NPO status.    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  Pt taking some of clear liquids TPN infusing via right PICC line at goal rate of 3960ml/hr  Medications reviewed: D5 in LR Labs reviewed Na 132, K 3.2, calcium 6.9   Diet Order:  Diet clear liquid Room service appropriate?: Yes; Fluid consistency:: Thin .TPN (CLINIMIX-E) Adult .TPN (CLINIMIX-E) Adult  Skin:  Reviewed, no issues  Last BM:  4/6 (100ml via ostomy)  Height:   Ht Readings from Last 1 Encounters:  01/19/16 5\' 7"  (1.702 m)    Weight:   Wt Readings from Last 1 Encounters:  01/30/16 152 lb 4.8 oz (69.083 kg)    Ideal Body Weight:     BMI:  Body mass index is 23.85 kg/(m^2).  Estimated Nutritional Needs:   Kcal:  1450-1740 kcals/d  Protein:  (1.0-1.2 g/kg) 58-70 g/d  Fluid:  1450-173440ml/d  EDUCATION NEEDS:   Education needs no appropriate at this time     Ducre B. Freida BusmanAllen, RD, LDN [phone] (pager) Weekend/On-Call pager [phone])

## 2016-01-30 NOTE — Progress Notes (Signed)
MEDICATION RELATED CONSULT NOTE - INITIAL   Pharmacy Consult for Electrolytes  Indication: Potassium, Magnesium   Allergies  Allergen Reactions  . Galantamine Other (See Comments)    Patient Measurements: Height: '5\' 7"'  (170.2 cm) Weight: 152 lb 4.8 oz (69.083 kg) IBW/kg (Calculated) : 61.6 Adjusted Body Weight:   Vital Signs: Temp: 98.3 F (36.8 C) (04/07 0533) Temp Source: Oral (04/07 0533) BP: 117/68 mmHg (04/07 1340) Pulse Rate: 91 (04/07 1340) Intake/Output from previous day: 04/06 0701 - 04/07 0700 In: 4811.3 [P.O.:990; I.V.:1941.4; IV Piggyback:724.3; TPN:1155.6] Out: 4380 [Urine:3800; Drains:130; Stool:450] Intake/Output from this shift: Total I/O In: 636.3 [I.V.:260.1; TPN:376.2] Out: 1170 [Urine:750; Drains:45; Stool:375]  Labs:  Recent Labs  01/28/16 0524 01/29/16 0434 01/30/16 0435 01/30/16 1513  WBC 9.3  --  13.9*  --   HGB 9.6*  --  8.6*  --   HCT 28.1*  --  26.1*  --   PLT 486*  --  540*  --   CREATININE 0.67 0.53 0.54  --   MG 1.9 1.8 1.7 1.6*  PHOS  --  3.1 3.6  --   ALBUMIN  --  1.3*  --   --   PROT  --  4.4*  --   --   AST  --  25  --   --   ALT  --  10*  --   --   ALKPHOS  --  93  --   --   BILITOT  --  0.3  --   --    Estimated Creatinine Clearance: 60.9 mL/min (by C-G formula based on Cr of 0.54).   Microbiology: Recent Results (from the past 720 hour(s))  MRSA PCR Screening     Status: None   Collection Time: 01/19/16  3:02 AM  Result Value Ref Range Status   MRSA by PCR NEGATIVE NEGATIVE Final    Comment:        The GeneXpert MRSA Assay (FDA approved for NASAL specimens only), is one component of a comprehensive MRSA colonization surveillance program. It is not intended to diagnose MRSA infection nor to guide or monitor treatment for MRSA infections.   Culture, routine-abscess     Status: None (Preliminary result)   Collection Time: 01/27/16  2:05 PM  Result Value Ref Range Status   Specimen Description ABSCESS  Final    Special Requests Normal  Final   Gram Stain   Final    MODERATE WBC SEEN MANY GRAM POSITIVE COCCI IN PAIRS MODERATE GRAM NEGATIVE RODS    Culture   Final    HEAVY GROWTH GRAM POSITIVE COCCI MODERATE GROWTH PSEUDOMONAS AERUGINOSA IDENTIFICATION AND SUSCEPTIBILITIES TO FOLLOW    Report Status PENDING  Incomplete  Culture, routine-abscess     Status: None (Preliminary result)   Collection Time: 01/27/16  2:25 PM  Result Value Ref Range Status   Specimen Description ABSCESS  Final   Special Requests Normal  Final   Gram Stain   Final    MODERATE WBC SEEN MANY GRAM POSITIVE COCCI IN PAIRS MANY GRAM NEGATIVE RODS    Culture   Final    HEAVY GROWTH GRAM POSITIVE COCCI MODERATE GROWTH PSEUDOMONAS AERUGINOSA IDENTIFICATION AND SUSCEPTIBILITIES TO FOLLOW    Report Status PENDING  Incomplete   Organism ID, Bacteria PSEUDOMONAS AERUGINOSA  Final      Susceptibility   Pseudomonas aeruginosa - MIC*    CEFTAZIDIME <=1 SENSITIVE Sensitive     CIPROFLOXACIN <=0.25 SENSITIVE Sensitive     GENTAMICIN <=  1 SENSITIVE Sensitive     IMIPENEM 1 SENSITIVE Sensitive     CEFEPIME <=1 SENSITIVE Sensitive     * MODERATE GROWTH PSEUDOMONAS AERUGINOSA    Medical History: Past Medical History  Diagnosis Date  . Allergy   . Depression   . Hyperlipidemia   . Anxiety   . Osteoporosis     Medications:  Scheduled:  . antiseptic oral rinse  7 mL Mouth Rinse QID  . chlorhexidine gluconate (SAGE KIT)  15 mL Mouth Rinse BID  . donepezil  10 mg Oral QHS  . enoxaparin (LOVENOX) injection  40 mg Subcutaneous Q24H  . feeding supplement  1 Container Oral TID BM  . insulin aspart  0-9 Units Subcutaneous 6 times per day  . magnesium sulfate 1 - 4 g bolus IVPB  2 g Intravenous Once  . metoprolol tartrate  12.5 mg Oral BID  . metronidazole  500 mg Intravenous Q8H  . pantoprazole (PROTONIX) IV  40 mg Intravenous QHS  . piperacillin-tazobactam  3.375 g Intravenous 3 times per day  . sodium chloride flush   10-40 mL Intracatheter Q12H  . vancomycin  1,250 mg Intravenous Q18H    Assessment: 4/07 @ 15:00 :  K = 3.5                         Mag = 1.6   Goal of Therapy:  K:  3.5 - 5.1  Mag : 1.7 - 2.4   Plan:  Magnesium sulfate 2 gm IV X 1 ordered for 4/7 @ 16:30. Will recheck electrolytes on 4/8 with AM labs.   Katherine Wood D 01/30/2016,4:20 PM

## 2016-01-30 NOTE — Progress Notes (Signed)
CC: s/p hartmann's   Subjective: Interventional abscess questionable fistula status post drainage. She was doing okay no complication. Gram-positive out from the drain as well as Pseudomonas. Started on vancomycin and Zosyn Taking by mouth and colostomy working Less 250cc from drains Anemia of chronic disease  onTPN    Objective: Vital signs in last 24 hours: Temp:  [98.2 F (36.8 C)-98.3 F (36.8 C)] 98.3 F (36.8 C) (04/07 0533) Pulse Rate:  [81-88] 88 (04/07 0533) Resp:  [16-20] 20 (04/07 0533) BP: (119-132)/(58-69) 132/69 mmHg (04/07 0942) SpO2:  [94 %-96 %] 94 % (04/07 0533) Weight:  [69.083 kg (152 lb 4.8 oz)] 69.083 kg (152 lb 4.8 oz) (04/07 0533) Last BM Date: 01/29/16 (colostomy)  Intake/Output from previous day: 04/06 0701 - 04/07 0700 In: 4811.3 [P.O.:990; I.V.:1941.4; IV Piggyback:724.3; TPN:1155.6] Out: 4380 [Urine:3800; Drains:130; Stool:450] Intake/Output this shift: Total I/O In: 290.2 [I.V.:121; TPN:169.2] Out: 400 [Urine:400]  Physical exam: NAD Abd: soft, drain some purulent drainage Lab Results: CBC   Recent Labs  01/28/16 0524 01/30/16 0435  WBC 9.3 13.9*  HGB 9.6* 8.6*  HCT 28.1* 26.1*  PLT 486* 540*   BMET  Recent Labs  01/29/16 0434 01/29/16 1430 01/30/16 0435  NA 136  --  132*  K 3.1* 3.5 3.2*  CL 106  --  106  CO2 26  --  26  GLUCOSE 134*  --  178*  BUN 9  --  9  CREATININE 0.53  --  0.54  CALCIUM 7.1*  --  6.9*   PT/INR No results for input(s): LABPROT, INR in the last 72 hours. ABG No results for input(s): PHART, HCO3 in the last 72 hours.  Invalid input(s): PCO2, PO2  Studies/Results: No results found.  Anti-infectives: Anti-infectives    Start     Dose/Rate Route Frequency Ordered Stop   01/30/16 1600  vancomycin (VANCOCIN) 1,250 mg in sodium chloride 0.9 % 250 mL IVPB     1,250 mg 166.7 mL/hr over 90 Minutes Intravenous Every 18 hours 01/30/16 0859     01/30/16 0900  vancomycin (VANCOCIN) IVPB 1000 mg/200 mL  premix     1,000 mg 200 mL/hr over 60 Minutes Intravenous  Once 01/30/16 0859 01/30/16 1035   01/30/16 0845  vancomycin (VANCOCIN) IVPB 1000 mg/200 mL premix  Status:  Discontinued     1,000 mg 200 mL/hr over 60 Minutes Intravenous Every 12 hours 01/30/16 0830 01/30/16 0859   01/29/16 1345  piperacillin-tazobactam (ZOSYN) IVPB 3.375 g     3.375 g 12.5 mL/hr over 240 Minutes Intravenous 3 times per day 01/29/16 1338     01/25/16 1230  metroNIDAZOLE (FLAGYL) IVPB 500 mg     500 mg 100 mL/hr over 60 Minutes Intravenous Every 8 hours 01/25/16 1216     01/25/16 1230  ertapenem (INVANZ) 1 g in sodium chloride 0.9 % 50 mL IVPB  Status:  Discontinued     1 g 100 mL/hr over 30 Minutes Intravenous Every 24 hours 01/25/16 1216 01/29/16 1338   01/19/16 0600  ciprofloxacin (CIPRO) IVPB 400 mg  Status:  Discontinued     400 mg 200 mL/hr over 60 Minutes Intravenous On call to O.R. 01/18/16 2122 01/19/16 0208   01/19/16 0600  metroNIDAZOLE (FLAGYL) IVPB 500 mg  Status:  Discontinued     500 mg 100 mL/hr over 60 Minutes Intravenous On call to O.R. 01/18/16 2122 01/19/16 0208   01/19/16 0600  piperacillin-tazobactam (ZOSYN) IVPB 3.375 g  Status:  Discontinued  3.375 g 12.5 mL/hr over 240 Minutes Intravenous 3 times per day 01/19/16 0251 01/25/16 1216   01/18/16 1945  piperacillin-tazobactam (ZOSYN) IVPB 3.375 g     3.375 g 12.5 mL/hr over 240 Minutes Intravenous STAT 01/18/16 1934 01/18/16 2041   01/18/16 1942  piperacillin-tazobactam (ZOSYN) 3.375 GM/50ML IVPB    Comments:  ORE, HUNTER: cabinet override      01/18/16 1942 01/19/16 0744      Assessment/Plan: Slowly improving Continue IV A/Bs, TPN, clear, PT Repeat Ct on Sunday No surgical intervention  Sterling Big, MD, FACS  01/30/2016

## 2016-01-30 NOTE — Care Management (Signed)
LTACH rep met with husband and patient 01/29/16.  Per surgery the earliest patient would be ready for discharge to [facility] would be Monday or Tuesday of next week

## 2016-01-30 NOTE — Plan of Care (Signed)
Problem: Safety: Goal: Ability to remain free from injury will improve Outcome: Progressing Pt has remained free from falls during my care. Bed alarm activated and call bell within reach.

## 2016-01-30 NOTE — Progress Notes (Signed)
Pharmacy Antibiotic Note  Katherine Wood is a 75 y.o. female admitted on 01/18/2016 with intraabdominal abscess.  Pharmacy has been consulted for vancomycin dosing.  Plan: Patient ordered vancomycin 1000mg  IV x 1. Will continue patient on vancomycin 1250mg  IV Q18hr for goal trough of 15-20. Will order stacked dose to be given at 1600. Will   Height: 5\' 7"  (170.2 cm) Weight: 152 lb 4.8 oz (69.083 kg) IBW/kg (Calculated) : 61.6  Temp (24hrs), Avg:98.2 F (36.8 C), Min:98.2 F (36.8 C), Max:98.3 F (36.8 C)   Recent Labs Lab 01/25/16 0524 01/26/16 0505 01/27/16 0801 01/28/16 0524 01/29/16 0434 01/30/16 0435  WBC 16.6* 10.5 9.8 9.3  --  13.9*  CREATININE 0.88 0.69 0.58 0.67 0.53 0.54    Estimated Creatinine Clearance: 60.9 mL/min (by C-G formula based on Cr of 0.54).    Allergies  Allergen Reactions  . Galantamine Other (See Comments)    Antimicrobials this admission: Ertapenem 4/2 >> 4/6 Metronidazole 3/26, 4/2 >>  Zosyn 3/27>>4/2, 4/6 >> Vancomycin 4/7 >> Cefazolin 3/26  Microbiology results: 4/4 Wound Cx: Pseudomonas 4/4 Wound Cx: Pseudomonas   Pharmacy will continue to monitor and adjust per consult.   Ric Rosenberg L 01/30/2016 9:03 AM

## 2016-01-30 NOTE — Clinical Social Work Note (Signed)
In the event [facility] and the husband is not able to care for patient at home, a SNF bedsearch has been initiated for patient. Patient's husband did mention to RN CM that he was concerned now about taking her home. Patient currently is requiring TPN, new ostomy, 5 jp drains.  York SpanielMonica Cylee Dattilo MSW,LCSW [phone]

## 2016-01-30 NOTE — Consult Note (Signed)
PARENTERAL NUTRITION CONSULT NOTE - FOLLOW UP   Pharmacy Consult for electrolytes/glucose Indication: TPN  Allergies  Allergen Reactions  . Galantamine Other (See Comments)    Patient Measurements: Height: 5\' 7"  (170.2 cm) Weight: 152 lb 4.8 oz (69.083 kg) IBW/kg (Calculated) : 61.6   Vital Signs: Temp: 98.3 F (36.8 C) (04/07 0533) Temp Source: Oral (04/07 0533) BP: 127/67 mmHg (04/07 0533) Pulse Rate: 88 (04/07 0533) Intake/Output from previous day: 04/06 0701 - 04/07 0700 In: 4811.3 [P.O.:990; I.V.:1941.4; IV Piggyback:724.3; TPN:1155.6] Out: 4380 [Urine:3800; Drains:130; Stool:450] Intake/Output from this shift:    Labs:  Recent Labs  01/27/16 0801 01/28/16 0524 01/30/16 0435  WBC 9.8 9.3 13.9*  HGB 9.8* 9.6* 8.6*  HCT 29.1* 28.1* 26.1*  PLT 412 486* 540*     Recent Labs  01/28/16 0524 01/29/16 0434 01/29/16 1430 01/30/16 0435  NA 137 136  --  132*  K 3.7 3.1* 3.5 3.2*  CL 108 106  --  106  CO2 25 26  --  26  GLUCOSE 99 134*  --  178*  BUN 9 9  --  9  CREATININE 0.67 0.53  --  0.54  CALCIUM 7.3* 7.1*  --  6.9*  MG 1.9 1.8  --  1.7  PHOS  --  3.1  --  3.6  PROT  --  4.4*  --   --   ALBUMIN  --  1.3*  --   --   AST  --  25  --   --   ALT  --  10*  --   --   ALKPHOS  --  93  --   --   BILITOT  --  0.3  --   --   TRIG  --  111  --   --    Estimated Creatinine Clearance: 60.9 mL/min (by C-G formula based on Cr of 0.54).    Recent Labs  01/29/16 2039 01/30/16 0050 01/30/16 0358  GLUCAP 140* 124* 127*   Insulin Requirements in the past 24 hours:  0  Current Nutrition:  Clinimix E 5/20 60 ml/hr   Assessment: K=3.1;   Plan:  Will give KCl 10mEq IV x 4 doses. Will recheck K @ 1500. Pharmacy to follow and supplement electrolytes are needed.  Demetrius Charityeldrin D. Barnabas Henriques, PharmD Clinical Pharmacist 01/30/2016 7:37 AM

## 2016-01-31 ENCOUNTER — Encounter: Payer: Self-pay | Admitting: Radiology

## 2016-01-31 ENCOUNTER — Inpatient Hospital Stay: Payer: Medicare Other

## 2016-01-31 LAB — CBC
HEMATOCRIT: 28.8 % — AB (ref 35.0–47.0)
HEMOGLOBIN: 8.9 g/dL — AB (ref 12.0–16.0)
MCH: 29.5 pg (ref 26.0–34.0)
MCHC: 30.8 g/dL — AB (ref 32.0–36.0)
MCV: 96 fL (ref 80.0–100.0)
Platelets: 597 10*3/uL — ABNORMAL HIGH (ref 150–440)
RBC: 3 MIL/uL — ABNORMAL LOW (ref 3.80–5.20)
RDW: 16 % — ABNORMAL HIGH (ref 11.5–14.5)
WBC: 17.2 10*3/uL — AB (ref 3.6–11.0)

## 2016-01-31 LAB — CULTURE, ROUTINE-ABSCESS
Special Requests: NORMAL
Special Requests: NORMAL

## 2016-01-31 LAB — GLUCOSE, CAPILLARY
GLUCOSE-CAPILLARY: 121 mg/dL — AB (ref 65–99)
GLUCOSE-CAPILLARY: 119 mg/dL — AB (ref 65–99)
Glucose-Capillary: 130 mg/dL — ABNORMAL HIGH (ref 65–99)
Glucose-Capillary: 130 mg/dL — ABNORMAL HIGH (ref 65–99)
Glucose-Capillary: 132 mg/dL — ABNORMAL HIGH (ref 65–99)

## 2016-01-31 LAB — BASIC METABOLIC PANEL
Anion gap: 5 (ref 5–15)
BUN: 10 mg/dL (ref 6–20)
CALCIUM: 6.8 mg/dL — AB (ref 8.9–10.3)
CO2: 22 mmol/L (ref 22–32)
CREATININE: 0.6 mg/dL (ref 0.44–1.00)
Chloride: 103 mmol/L (ref 101–111)
GFR calc non Af Amer: >60 mL/min (ref >60)
Glucose, Bld: 131 mg/dL — ABNORMAL HIGH (ref 65–99)
Potassium: 3 mmol/L — ABNORMAL LOW (ref 3.5–5.1)
SODIUM: 130 mmol/L — AB (ref 135–145)

## 2016-01-31 LAB — MAGNESIUM: MAGNESIUM: 1.9 mg/dL (ref 1.7–2.4)

## 2016-01-31 LAB — POTASSIUM: Potassium: 3.2 mmol/L — ABNORMAL LOW (ref 3.5–5.1)

## 2016-01-31 LAB — CK: Total CK: 32 U/L — ABNORMAL LOW (ref 38–234)

## 2016-01-31 MED ORDER — IOPAMIDOL (ISOVUE-300) INJECTION 61%
100.0000 mL | Freq: Once | INTRAVENOUS | Status: AC | PRN
  Administered 2016-01-31: 100 mL via INTRAVENOUS

## 2016-01-31 MED ORDER — FLUCONAZOLE IN SODIUM CHLORIDE 200-0.9 MG/100ML-% IV SOLN
200.0000 mg | INTRAVENOUS | Status: DC
  Administered 2016-02-01 – 2016-02-10 (×10): 200 mg via INTRAVENOUS
  Filled 2016-01-31 (×11): qty 100

## 2016-01-31 MED ORDER — TRACE MINERALS CR-CU-MN-SE-ZN 10-1000-500-60 MCG/ML IV SOLN
INTRAVENOUS | Status: AC
  Administered 2016-02-01: 19:00:00 via INTRAVENOUS
  Filled 2016-01-31: qty 1440

## 2016-01-31 MED ORDER — FLUCONAZOLE IN SODIUM CHLORIDE 400-0.9 MG/200ML-% IV SOLN
400.0000 mg | Freq: Once | INTRAVENOUS | Status: AC
  Administered 2016-01-31: 400 mg via INTRAVENOUS
  Filled 2016-01-31: qty 200

## 2016-01-31 MED ORDER — POTASSIUM CHLORIDE 10 MEQ/100ML IV SOLN
10.0000 meq | INTRAVENOUS | Status: AC
  Administered 2016-01-31 – 2016-02-01 (×3): 10 meq via INTRAVENOUS
  Filled 2016-01-31 (×3): qty 100

## 2016-01-31 MED ORDER — PANTOPRAZOLE SODIUM 40 MG IV SOLR
40.0000 mg | Freq: Two times a day (BID) | INTRAVENOUS | Status: DC
  Administered 2016-01-31 – 2016-02-14 (×26): 40 mg via INTRAVENOUS
  Filled 2016-01-31 (×29): qty 40

## 2016-01-31 MED ORDER — POTASSIUM CHLORIDE 10 MEQ/100ML IV SOLN
10.0000 meq | INTRAVENOUS | Status: AC
  Administered 2016-01-31 (×4): 10 meq via INTRAVENOUS
  Filled 2016-01-31 (×4): qty 100

## 2016-01-31 MED ORDER — DAPTOMYCIN 500 MG IV SOLR
526.0000 mg | INTRAVENOUS | Status: DC
  Administered 2016-01-31: 526 mg via INTRAVENOUS
  Filled 2016-01-31 (×3): qty 10.52

## 2016-01-31 MED ORDER — DIATRIZOATE MEGLUMINE & SODIUM 66-10 % PO SOLN
15.0000 mL | Freq: Once | ORAL | Status: DC
  Filled 2016-01-31: qty 30

## 2016-01-31 MED ORDER — TRACE MINERALS CR-CU-MN-SE-ZN 10-1000-500-60 MCG/ML IV SOLN
INTRAVENOUS | Status: AC
  Administered 2016-01-31 – 2016-02-01 (×2): via INTRAVENOUS
  Filled 2016-01-31: qty 1440

## 2016-01-31 NOTE — Progress Notes (Signed)
Pt.'s daughter Gunnar Fusiaula would like to talk to the doctor that is rounding on her mother tomorrow, 01/31/2016. The best way to reach her is at [phone]. Thank you.  Karsten RoLauren E Hobbs

## 2016-01-31 NOTE — Progress Notes (Signed)
POD # 13 s/p Hartman's her for a diverticulitis and purulent peritonitis S/p Per drains of abscesses vs fistula.  WBC 17 went up Afebrile VSS Pseudomonas and gram + for culture Creat Ok Good UO Taking PO Drain 125 ( slowing down)  PE NAD Alert, non toxic Abd: soft, wound good granulation. No infection. colostomy working. No peritonitis     Plan CT A/P re-evalute new collections Add antifungal given purulent peritonitis Keep npo Continue TPN Non toxic no need for emergent surgery D/w husband in detail

## 2016-01-31 NOTE — Consult Note (Signed)
PARENTERAL NUTRITION CONSULT NOTE - FOLLOW UP   Pharmacy Consult for electrolytes/glucose Indication: TPN  Allergies  Allergen Reactions  . Galantamine Other (See Comments)    Patient Measurements: Height: 5\' 7"  (170.2 cm) Weight: 144 lb 12.8 oz (65.681 kg) IBW/kg (Calculated) : 61.6   Vital Signs: Temp: 97.6 F (36.4 C) (04/08 2028) Temp Source: Oral (04/08 1205) BP: 122/56 mmHg (04/08 2028) Pulse Rate: 94 (04/08 2028) Intake/Output from previous day: 04/07 0701 - 04/08 0700 In: 1215.3 [I.V.:260.1; TPN:955.2] Out: 3275 [Urine:1950; Drains:125; Stool:1200] Intake/Output from this shift: Total I/O In: -  Out: 550 [Urine:550]  Labs:  Recent Labs  01/30/16 0435 01/31/16 0502  WBC 13.9* 17.2*  HGB 8.6* 8.9*  HCT 26.1* 28.8*  PLT 540* 597*     Recent Labs  01/29/16 0434  01/30/16 0435 01/30/16 1513 01/31/16 0549 01/31/16 1957  NA 136  --  132*  --  130*  --   K 3.1*  < > 3.2* 3.5 3.0* 3.2*  CL 106  --  106  --  103  --   CO2 26  --  26  --  22  --   GLUCOSE 134*  --  178*  --  131*  --   BUN 9  --  9  --  10  --   CREATININE 0.53  --  0.54  --  0.60  --   CALCIUM 7.1*  --  6.9*  --  6.8*  --   MG 1.8  --  1.7 1.6* 1.9  --   PHOS 3.1  --  3.6  --   --   --   PROT 4.4*  --   --   --   --   --   ALBUMIN 1.3*  --   --   --   --   --   AST 25  --   --   --   --   --   ALT 10*  --   --   --   --   --   ALKPHOS 93  --   --   --   --   --   BILITOT 0.3  --   --   --   --   --   TRIG 111  --   --   --   --   --   < > = values in this interval not displayed. Estimated Creatinine Clearance: 60.9 mL/min (by C-G formula based on Cr of 0.6).    Recent Labs  01/31/16 1204 01/31/16 1653 01/31/16 2033  GLUCAP 130* 119* 132*   Insulin Requirements in the past 24 hours:   3 units SSI q4h  Current Nutrition:  Clinimix E 5/20 started 01/28/16 now at 60 ml/hr ?Clear liquids  Assessment: Follow up potassium remains low at 3.2  Plan:  Will give KCl 10mEq IV  x 3 doses.    Will check electrolytes in am.  Keturah BarreMary Dyrell Tuccillo PharmD Clinical Pharmacist 01/31/2016 9:49 PM

## 2016-01-31 NOTE — Consult Note (Signed)
PARENTERAL NUTRITION CONSULT NOTE - FOLLOW UP   Pharmacy Consult for electrolytes/glucose Indication: TPN  Allergies  Allergen Reactions  . Galantamine Other (See Comments)    Patient Measurements: Height: 5\' 7"  (170.2 cm) Weight: 144 lb 12.8 oz (65.681 kg) IBW/kg (Calculated) : 61.6   Vital Signs: Temp: 98 F (36.7 C) (04/08 0803) Temp Source: Oral (04/08 0803) BP: 134/66 mmHg (04/08 0431) Pulse Rate: 90 (04/08 0600) Intake/Output from previous day: 04/07 0701 - 04/08 0700 In: 1215.3 [I.V.:260.1; TPN:955.2] Out: 3275 [Urine:1950; Drains:125; Stool:1200] Intake/Output from this shift: Total I/O In: 0  Out: 250 [Stool:250]  Labs:  Recent Labs  01/30/16 0435 01/31/16 0502  WBC 13.9* 17.2*  HGB 8.6* 8.9*  HCT 26.1* 28.8*  PLT 540* 597*     Recent Labs  01/29/16 0434  01/30/16 0435 01/30/16 1513 01/31/16 0549  NA 136  --  132*  --  130*  K 3.1*  < > 3.2* 3.5 3.0*  CL 106  --  106  --  103  CO2 26  --  26  --  22  GLUCOSE 134*  --  178*  --  131*  BUN 9  --  9  --  10  CREATININE 0.53  --  0.54  --  0.60  CALCIUM 7.1*  --  6.9*  --  6.8*  MG 1.8  --  1.7 1.6*  --   PHOS 3.1  --  3.6  --   --   PROT 4.4*  --   --   --   --   ALBUMIN 1.3*  --   --   --   --   AST 25  --   --   --   --   ALT 10*  --   --   --   --   ALKPHOS 93  --   --   --   --   BILITOT 0.3  --   --   --   --   TRIG 111  --   --   --   --   < > = values in this interval not displayed. Estimated Creatinine Clearance: 60.9 mL/min (by C-G formula based on Cr of 0.6).    Recent Labs  01/30/16 2322 01/31/16 0441 01/31/16 0802  GLUCAP 107* 121* 130*   Insulin Requirements in the past 24 hours:   3 units SSI q4h  Current Nutrition:  Clinimix E 5/20 started 01/28/16 now at 60 ml/hr ?Clear liquids  Assessment: K=3.0 Mag=   Plan:  Will give KCl 10mEq IV x 4 doses again today.  Will recheck K @ 1800.  Will check electrolytes in am.  Bari MantisKristin Darletta Noblett PharmD Clinical  Pharmacist 01/31/2016 9:21 AM

## 2016-01-31 NOTE — Progress Notes (Signed)
Pharmacy Antibiotic Note  Katherine Wood is a 75 y.o. female admitted on 01/18/2016 with abscess.  Pharmacy has been consulted for daptomycin dosing.  Plan: Will order daptomycin 8 mg/kg IV q24h Will order CK for baseline (ordered as add on)  Called Micro lab, Pseudomonas aeruginosa growing is sensitive to Zosyn. MD would like to continue Zosyn at this time as WBC is trending up.   Height: 5\' 7"  (170.2 cm) Weight: 144 lb 12.8 oz (65.681 kg) IBW/kg (Calculated) : 61.6  Temp (24hrs), Avg:98.4 F (36.9 C), Min:98 F (36.7 C), Max:98.6 F (37 C)   Recent Labs Lab 01/26/16 0505 01/27/16 0801 01/28/16 0524 01/29/16 0434 01/30/16 0435 01/31/16 0502 01/31/16 0549  WBC 10.5 9.8 9.3  --  13.9* 17.2*  --   CREATININE 0.69 0.58 0.67 0.53 0.54  --  0.60    Estimated Creatinine Clearance: 60.9 mL/min (by C-G formula based on Cr of 0.6).    Allergies  Allergen Reactions  . Galantamine Other (See Comments)    Antimicrobials this admission: Ertapenem  4/2 >> 4/6 Zosyn  4/6 >>  Flagyl 4/2>> Vancomycin 4/7 >> 4/8 Fluconazole 4/8>> Daptomycin 4/8>>  Dose adjustments this admission:   Microbiology results: 4/4 Abscess CX: P. aeruginosa, VRE 3/27 MRSA PCR: negative  Thank you for allowing pharmacy to be a part of this patient's care.  Marty HeckWang, Ciani Rutten L 01/31/2016 11:48 AM

## 2016-01-31 NOTE — Progress Notes (Signed)
CT revealed enlarging collection and along the posterior aspect of the stomach and also persistent collection and anterior to the left kidney. Discussed with interventional radiology and a do not feel that this is amenable for percutaneous drainage by CT. I did discuss the case in detail with her husband and recommended surgical drainage. We will attempt for laparoscopic drainage of abscess and repositioning of the drains. Also possible open. Procedure discussed with the patient and the husband in detail. Risks benefits and possible complications he understands and he is in agreement.

## 2016-02-01 ENCOUNTER — Encounter
Admission: EM | Disposition: A | Discharge status: Skilled Nursing Facility | Payer: Self-pay | Source: Home / Self Care | Attending: Surgery

## 2016-02-01 ENCOUNTER — Encounter: Payer: Self-pay | Admitting: Anesthesiology

## 2016-02-01 ENCOUNTER — Inpatient Hospital Stay: Payer: Medicare Other

## 2016-02-01 ENCOUNTER — Inpatient Hospital Stay: Payer: Medicare Other | Admitting: Anesthesiology

## 2016-02-01 DIAGNOSIS — K651 Peritoneal abscess: Secondary | ICD-10-CM | POA: Insufficient documentation

## 2016-02-01 HISTORY — PX: INCISION AND DRAINAGE ABSCESS: SHX5864

## 2016-02-01 HISTORY — PX: LAPAROSCOPY: SHX197

## 2016-02-01 HISTORY — PX: LAPAROTOMY: SHX154

## 2016-02-01 LAB — CBC
HCT: 25.3 % — ABNORMAL LOW (ref 35.0–47.0)
HEMOGLOBIN: 8.3 g/dL — AB (ref 12.0–16.0)
MCH: 30.5 pg (ref 26.0–34.0)
MCHC: 32.8 g/dL (ref 32.0–36.0)
MCV: 93 fL (ref 80.0–100.0)
Platelets: 539 10*3/uL — ABNORMAL HIGH (ref 150–440)
RBC: 2.72 MIL/uL — AB (ref 3.80–5.20)
RDW: 15 % — ABNORMAL HIGH (ref 11.5–14.5)
WBC: 13.3 10*3/uL — AB (ref 3.6–11.0)

## 2016-02-01 LAB — GLUCOSE, CAPILLARY
GLUCOSE-CAPILLARY: 132 mg/dL — AB (ref 65–99)
GLUCOSE-CAPILLARY: 187 mg/dL — AB (ref 65–99)
Glucose-Capillary: 134 mg/dL — ABNORMAL HIGH (ref 65–99)
Glucose-Capillary: 139 mg/dL — ABNORMAL HIGH (ref 65–99)
Glucose-Capillary: 198 mg/dL — ABNORMAL HIGH (ref 65–99)

## 2016-02-01 LAB — BASIC METABOLIC PANEL
Anion gap: 4 — ABNORMAL LOW (ref 5–15)
BUN: 13 mg/dL (ref 6–20)
CHLORIDE: 109 mmol/L (ref 101–111)
CO2: 22 mmol/L (ref 22–32)
Calcium: 7.2 mg/dL — ABNORMAL LOW (ref 8.9–10.3)
Creatinine, Ser: 0.53 mg/dL (ref 0.44–1.00)
GFR calc Af Amer: >60 mL/min (ref >60)
GFR calc non Af Amer: >60 mL/min (ref >60)
Glucose, Bld: 135 mg/dL — ABNORMAL HIGH (ref 65–99)
POTASSIUM: 3.7 mmol/L (ref 3.5–5.1)
SODIUM: 135 mmol/L (ref 135–145)

## 2016-02-01 LAB — C DIFFICILE QUICK SCREEN W PCR REFLEX
C DIFFICILE (CDIFF) INTERP: NEGATIVE
C DIFFICILE (CDIFF) TOXIN: NEGATIVE
C DIFFICLE (CDIFF) ANTIGEN: NEGATIVE

## 2016-02-01 LAB — MAGNESIUM: MAGNESIUM: 1.9 mg/dL (ref 1.7–2.4)

## 2016-02-01 LAB — PHOSPHORUS: Phosphorus: 3.4 mg/dL (ref 2.5–4.6)

## 2016-02-01 SURGERY — LAPAROSCOPY, DIAGNOSTIC
Anesthesia: General | Site: Abdomen | Wound class: Dirty or Infected

## 2016-02-01 MED ORDER — ROCURONIUM BROMIDE 100 MG/10ML IV SOLN
INTRAVENOUS | Status: DC | PRN
  Administered 2016-02-01: 10 mg via INTRAVENOUS
  Administered 2016-02-01 (×2): 20 mg via INTRAVENOUS

## 2016-02-01 MED ORDER — PROPOFOL 10 MG/ML IV BOLUS
INTRAVENOUS | Status: DC | PRN
  Administered 2016-02-01: 120 mg via INTRAVENOUS

## 2016-02-01 MED ORDER — ONDANSETRON HCL 4 MG/2ML IJ SOLN
INTRAMUSCULAR | Status: DC | PRN
  Administered 2016-02-01: 4 mg via INTRAVENOUS

## 2016-02-01 MED ORDER — DEXAMETHASONE SODIUM PHOSPHATE 10 MG/ML IJ SOLN
INTRAMUSCULAR | Status: DC | PRN
  Administered 2016-02-01: 10 mg via INTRAVENOUS

## 2016-02-01 MED ORDER — ACETAMINOPHEN 10 MG/ML IV SOLN
1000.0000 mg | Freq: Four times a day (QID) | INTRAVENOUS | Status: AC
  Administered 2016-02-01 – 2016-02-02 (×4): 1000 mg via INTRAVENOUS
  Filled 2016-02-01 (×4): qty 100

## 2016-02-01 MED ORDER — LACTATED RINGERS IV SOLN
INTRAVENOUS | Status: DC | PRN
  Administered 2016-02-01: 10:00:00 via INTRAVENOUS

## 2016-02-01 MED ORDER — PHENYLEPHRINE HCL 10 MG/ML IJ SOLN
INTRAMUSCULAR | Status: DC | PRN
  Administered 2016-02-01 (×3): 100 ug via INTRAVENOUS

## 2016-02-01 MED ORDER — FENTANYL CITRATE (PF) 100 MCG/2ML IJ SOLN
25.0000 ug | INTRAMUSCULAR | Status: DC | PRN
  Administered 2016-02-01 (×2): 25 ug via INTRAVENOUS

## 2016-02-01 MED ORDER — FENTANYL CITRATE (PF) 100 MCG/2ML IJ SOLN
INTRAMUSCULAR | Status: DC | PRN
  Administered 2016-02-01 (×2): 50 ug via INTRAVENOUS

## 2016-02-01 MED ORDER — SUCCINYLCHOLINE CHLORIDE 20 MG/ML IJ SOLN
INTRAMUSCULAR | Status: DC | PRN
  Administered 2016-02-01: 100 mg via INTRAVENOUS

## 2016-02-01 MED ORDER — SUGAMMADEX SODIUM 200 MG/2ML IV SOLN
INTRAVENOUS | Status: DC | PRN
  Administered 2016-02-01: 200 mg via INTRAVENOUS

## 2016-02-01 MED ORDER — ONDANSETRON HCL 4 MG/2ML IJ SOLN: 4.0000 mg | Freq: Once | INTRAMUSCULAR | Status: DC | PRN

## 2016-02-01 MED ORDER — BUPIVACAINE-EPINEPHRINE (PF) 0.25% -1:200000 IJ SOLN
INTRAMUSCULAR | Status: DC | PRN
  Administered 2016-02-01: 30 mL via PERINEURAL

## 2016-02-01 SURGICAL SUPPLY — 88 items
ADHESIVE MASTISOL STRL (MISCELLANEOUS) ×6 IMPLANT
APPLIER CLIP 11 MED OPEN (CLIP)
APPLIER CLIP 13 LRG OPEN (CLIP)
BARRIER SKIN 2 1/4 (WOUND CARE) ×2 IMPLANT
BARRIER SKIN 2 1/4INCH (WOUND CARE) ×1
BLADE CLIPPER SURG (BLADE) IMPLANT
BLADE SURG 15 STRL LF DISP TIS (BLADE) ×1 IMPLANT
BLADE SURG 15 STRL SS (BLADE) ×2
BNDG GAUZE 4.5X4.1 6PLY STRL (MISCELLANEOUS) ×3 IMPLANT
BULB RESERV EVAC DRAIN JP 100C (MISCELLANEOUS) ×9 IMPLANT
CANISTER SUCT 1200ML W/VALVE (MISCELLANEOUS) ×6 IMPLANT
CANISTER SUCT 3000ML (MISCELLANEOUS) ×3 IMPLANT
CHLORAPREP W/TINT 26ML (MISCELLANEOUS) IMPLANT
CLAMP POUCH DRAINAGE QUIET (OSTOMY) IMPLANT
CLEANER CAUTERY TIP 5X5 PAD (MISCELLANEOUS) IMPLANT
CLIP APPLIE 11 MED OPEN (CLIP) IMPLANT
CLIP APPLIE 13 LRG OPEN (CLIP) IMPLANT
CNTNR SPEC 2.5X3XGRAD LEK (MISCELLANEOUS) ×1
CONT SPEC 4OZ STER OR WHT (MISCELLANEOUS) ×2
CONTAINER SPEC 2.5X3XGRAD LEK (MISCELLANEOUS) ×1 IMPLANT
DEVICE TROCAR PUNCTURE CLOSURE (ENDOMECHANICALS) IMPLANT
DISSECTOR KITTNER STICK (MISCELLANEOUS) ×2 IMPLANT
DISSECTORS/KITTNER STICK (MISCELLANEOUS) ×6
DRAPE INCISE IOBAN 66X45 STRL (DRAPES) ×3 IMPLANT
DRAPE LAPAROTOMY 100X77 ABD (DRAPES) ×3 IMPLANT
DRAPE TABLE BACK 80X90 (DRAPES) ×3 IMPLANT
DRESSING TELFA 4X3 1S ST N-ADH (GAUZE/BANDAGES/DRESSINGS) IMPLANT
DRSG TEGADERM 2-3/8X2-3/4 SM (GAUZE/BANDAGES/DRESSINGS) ×6 IMPLANT
DRSG TELFA 3X8 NADH (GAUZE/BANDAGES/DRESSINGS) ×3 IMPLANT
ELECT BLADE 6.5 EXT (BLADE) IMPLANT
ELECT REM PT RETURN 9FT ADLT (ELECTROSURGICAL) ×3
ELECTRODE REM PT RTRN 9FT ADLT (ELECTROSURGICAL) ×1 IMPLANT
GAUZE SPONGE 4X4 12PLY STRL (GAUZE/BANDAGES/DRESSINGS) ×3 IMPLANT
GLOVE BIO SURGEON STRL SZ7 (GLOVE) ×3 IMPLANT
GOWN STRL REUS W/ TWL LRG LVL3 (GOWN DISPOSABLE) ×2 IMPLANT
GOWN STRL REUS W/TWL LRG LVL3 (GOWN DISPOSABLE) ×4
HANDLE SUCTION POOLE (INSTRUMENTS) IMPLANT
HANDLE YANKAUER SUCT BULB TIP (MISCELLANEOUS) ×3 IMPLANT
IRRIGATION STRYKERFLOW (MISCELLANEOUS) ×1 IMPLANT
IRRIGATOR STRYKERFLOW (MISCELLANEOUS) ×3
IV SOD CHL 0.9% 1000ML (IV SOLUTION) ×3 IMPLANT
L-HOOK LAP DISP 36CM (ELECTROSURGICAL)
LHOOK LAP DISP 36CM (ELECTROSURGICAL) IMPLANT
LIQUID BAND (GAUZE/BANDAGES/DRESSINGS) IMPLANT
NEEDLE HYPO 22GX1.5 SAFETY (NEEDLE) ×6 IMPLANT
NEEDLE HYPO 25X1 1.5 SAFETY (NEEDLE) ×3 IMPLANT
NEEDLE SPNL 22GX3.5 QUINCKE BK (NEEDLE) ×3 IMPLANT
NS IRRIG 1000ML POUR BTL (IV SOLUTION) ×3 IMPLANT
PACK BASIN MAJOR ARMC (MISCELLANEOUS) IMPLANT
PACK BASIN MINOR ARMC (MISCELLANEOUS) IMPLANT
PACK LAP CHOLECYSTECTOMY (MISCELLANEOUS) ×3 IMPLANT
PAD ABD DERMACEA PRESS 5X9 (GAUZE/BANDAGES/DRESSINGS) ×6 IMPLANT
PAD CLEANER CAUTERY TIP 5X5 (MISCELLANEOUS)
PENCIL ELECTRO HAND CTR (MISCELLANEOUS) ×3 IMPLANT
POUCH DRAIN 2 3/4 LARGE BLUE 1 (OSTOMY) ×3 IMPLANT
SCISSORS METZENBAUM CVD 33 (INSTRUMENTS) ×3 IMPLANT
SCRUB POVIDONE IODINE 4 OZ (MISCELLANEOUS) ×3 IMPLANT
SLEEVE ADV FIXATION 5X100MM (TROCAR) IMPLANT
SOL PREP PVP 2OZ (MISCELLANEOUS) ×3
SOLUTION PREP PVP 2OZ (MISCELLANEOUS) ×1 IMPLANT
SPONGE LAP 18X18 5 PK (GAUZE/BANDAGES/DRESSINGS) ×3 IMPLANT
STAPLER SKIN PROX 35W (STAPLE) ×3 IMPLANT
SUCTION POOLE HANDLE (INSTRUMENTS)
SUT ETHIBOND 0 MO6 C/R (SUTURE) ×3 IMPLANT
SUT MNCRL AB 4-0 PS2 18 (SUTURE) ×3 IMPLANT
SUT PDS AB 1 CT1 27 (SUTURE) ×6 IMPLANT
SUT PDS AB 1 TP1 96 (SUTURE) ×6 IMPLANT
SUT SILK 2 0 (SUTURE) ×2
SUT SILK 2 0 SH (SUTURE) ×3 IMPLANT
SUT SILK 2 0 SH CR/8 (SUTURE) ×3 IMPLANT
SUT SILK 2 0SH CR/8 30 (SUTURE) ×6 IMPLANT
SUT SILK 2-0 18XBRD TIE 12 (SUTURE) ×1 IMPLANT
SUT VIC AB 0 CT1 36 (SUTURE) ×6 IMPLANT
SUT VIC AB 2-0 SH 27 (SUTURE) ×4
SUT VIC AB 2-0 SH 27XBRD (SUTURE) ×2 IMPLANT
SUT VIC AB 3-0 SH 27 (SUTURE) ×2
SUT VIC AB 3-0 SH 27X BRD (SUTURE) ×1 IMPLANT
SUT VICRYL 0 AB UR-6 (SUTURE) ×3 IMPLANT
SYR 20CC LL (SYRINGE) IMPLANT
SYR 3ML LL SCALE MARK (SYRINGE) IMPLANT
SYRINGE 10CC LL (SYRINGE) ×3 IMPLANT
TAPE MICROFOAM 4IN (TAPE) ×3 IMPLANT
TOWEL OR 17X26 4PK STRL BLUE (TOWEL DISPOSABLE) ×3 IMPLANT
TRAP SPECIMEN MUCOUS 40CC (MISCELLANEOUS) IMPLANT
TRAY FOLEY W/METER SILVER 16FR (SET/KITS/TRAYS/PACK) IMPLANT
TROCAR XCEL BLUNT TIP 100MML (ENDOMECHANICALS) IMPLANT
TROCAR Z-THREAD OPTICAL 5X100M (TROCAR) ×3 IMPLANT
TUBING INSUFFLATOR HI FLOW (MISCELLANEOUS) ×3 IMPLANT

## 2016-02-01 NOTE — Op Note (Signed)
PROCEDURES: 1.  Attempted laparoscopic drainage 2. Lysis of thick adhesions 3. Open drainage of lesser sac abscess 4. Repositioning of Blake drain # 19 Fr.  Pre-operative Diagnosis: Intra-abdominal abscess not amenable to percutaneous drainage  Post-operative Diagnosis: same  Surgeon: Merri Rayiego F Pabon    Anesthesia: General endotracheal anesthesia  ASA Class: 3   Surgeon: Sterling Bigiego Pabon , MD FACS  Anesthesia: Gen. with endotracheal tube  Findings: Thick adhesions in the abdominal wall to the small bowel Plaster abdomen inability to drain collection anterior to the left kidney Large lesser sac abscess posterior to the stomach wall   Estimated Blood Loss: 25cc         Drains: # 19 FR blake drain         Specimens: fluid send c/s        Complications: none            Condition: stable  Procedure Details  The patient was seen again in the Holding Room. The benefits, complications, treatment options, and expected outcomes were discussed with the patient. The risks of bleeding, infection, recurrence of symptoms, failure to resolve symptoms,  bowel injury, any of which could require further surgery were reviewed with the patient.   The patient was taken to Operating Room, identified as Katherine Wood and the procedure verified.  A Time Out was held and the above information confirmed.  Prior to the induction of general anesthesia, antibiotic prophylaxis was administered. VTE prophylaxis was in place. General endotracheal anesthesia was then administered and tolerated well. After the induction, the abdomen was prepped with Betadine and draped in the sterile fashion, we isolated the colostomy to prevent any contamination to our field. The patient was positioned in the supine position.  We started with a left upper quadrant incision measuring 2.5 cms electrocautery was used and using a cutdown technique the intra-abdominal cavity was entered under direct visualization. Thick adhesions  were observed and were able to finger fracture some of the adhesions to allow the placement of the single incision port. And the port was placed in the standard fashion and pneumoperitoneum obtained and there was thick adhesions that we start to lyse the abdominal wall to the small bowel. But there was no developing of space given the fact that the small bowel was matted between each other and there was no room for a laparoscopic case. And after attempting this laparoscopically I realized that I needed to convert to open. My incision was extended approximately a total length of 6 cm subcostally. I'd perform this because towards the mid and there were dense adhesions and this was the quickest and safest way to access the abscess. Extensive lysis of adhesions performed very meticulously until I was able to identify the stomach and the greater curvature. The lesser sac was thick and inflamed and I did review the CT scan intraoperatively with along with the relationship of the abscess. I was able to feel some fluctuance and using a needle guide a 22 spinal needle I am aspirated the abscess in fact there was significant thick pus. Using the needle as a guidance I was very meticulous and the dissection of the lesser sac using electrocautery I divided the greater omentum and entered the cavity there was at least 100 cc of that was drained. He was sent for culture and sensitivity. We created this tract and she ray had an Blake drain that I was able to reposition towards the cavity. An this because of the concerns of a  possible fistula and I approximated the omentum to the greater curvature of the stomach also to cover and to isolate the drain. This was done with interrupted 2 0 silks. Please note that her tissues were very friable and there were thick adhesions and the bowel was matted again she saw the each other.  I was unable to drain the abscess that was smaller and was located on the anterior portion to the kidney. An  this was very difficult because of the exposure and the adhesive disease. We irrigated the abdominal cavity and I closed the anterior and posterior fascia with a #1 PDS. I decided to leave the small wound opening secondary to the purulence that was encountered. The drains were labile and the ostomy appliance placed back again. The midline wound was redressed with a wet-to-dry. I was able to place Marcaine quarter percent on any incision. Needle and laparotomy Were correct and there were no immediate indications. He does of the fragility of the tissue and the chance of the patient developing a fistula to the stomach I decided to place an NG tube.  her drains in the operating room it looks like one of the drains and its a contained fistula from her GI tract and I hope that the NG tube will help [place] manage this low output fistula.    Sterling Big, MD, FACS

## 2016-02-01 NOTE — Progress Notes (Signed)
Pharmacy Antibiotic Note  Katherine Wood is a 75 y.o. female admitted on 01/18/2016 with abdominal abscess.  Pharmacy has been consulted for daptomycin dosing for VRE.  Plan: Will continue daptomycin 8 mg/kg IV q24h Will order CK for baseline (ordered as add on)  Called Micro lab, Pseudomonas aeruginosa growing is sensitive to Zosyn. MD would like to continue Zosyn at this time as WBC is trending up.   Height: 5\' 7"  (170.2 cm) Weight: 141 lb 9.6 oz (64.229 kg) IBW/kg (Calculated) : 61.6  Temp (24hrs), Avg:97.5 F (36.4 C), Min:96.8 F (36 C), Max:98.1 F (36.7 C)   Recent Labs Lab 01/27/16 0801 01/28/16 0524 01/29/16 0434 01/30/16 0435 01/31/16 0502 01/31/16 0549 02/01/16 0431  WBC 9.8 9.3  --  13.9* 17.2*  --  13.3*  CREATININE 0.58 0.67 0.53 0.54  --  0.60 0.53    Estimated Creatinine Clearance: 60.9 mL/min (by C-G formula based on Cr of 0.53).    Allergies  Allergen Reactions  . Galantamine Other (See Comments)    Antimicrobials this admission: Ertapenem  4/2 >> 4/6 Zosyn  4/6 >>  Flagyl 4/2>> Vancomycin 4/7 >> 4/8 Fluconazole 4/8>> Daptomycin 4/8>>  Dose adjustments this admission:   Microbiology results: 4/4 Abd.Abscess CX: P. aeruginosa, VRE 3/27 MRSA PCR: negative  Thank you for allowing pharmacy to be a part of this patient's care.  Kylie Gros A 02/01/2016 1:54 PM

## 2016-02-01 NOTE — Anesthesia Procedure Notes (Signed)
Procedure Name: Intubation Date/Time: 02/01/2016 10:34 AM Performed by: Irving BurtonBACHICH, Kaliegh Willadsen Pre-anesthesia Checklist: Patient identified, Emergency Drugs available, Suction available and Patient being monitored Patient Re-evaluated:Patient Re-evaluated prior to inductionOxygen Delivery Method: Circle system utilized Preoxygenation: Pre-oxygenation with 100% oxygen Intubation Type: IV induction Ventilation: Mask ventilation without difficulty Laryngoscope Size: Miller and 2 Grade View: Grade II Tube type: Oral Tube size: 7.0 mm Number of attempts: 2 Airway Equipment and Method: Stylet Placement Confirmation: positive ETCO2 and breath sounds checked- equal and bilateral Secured at: 21 cm Tube secured with: Tape Dental Injury: Teeth and Oropharynx as per pre-operative assessment  Difficulty Due To: Difficult Airway- due to anterior larynx

## 2016-02-01 NOTE — Anesthesia Preprocedure Evaluation (Addendum)
Anesthesia Evaluation  Patient identified by MRN, date of birth, ID band Patient awake    Reviewed: Allergy & Precautions, NPO status , Patient's Chart, lab work & pertinent test results  Airway Mallampati: III  TM Distance: <3 FB Neck ROM: Full    Dental  (+) Chipped   Pulmonary neg pulmonary ROS,    Pulmonary exam normal breath sounds clear to auscultation       Cardiovascular Exercise Tolerance: Good + Peripheral Vascular Disease  Normal cardiovascular exam Rhythm:Regular  Leg varices   Neuro/Psych PSYCHIATRIC DISORDERS Anxiety Depression negative neurological ROS     GI/Hepatic Neg liver ROS, Hx of bowel perforation and diverticulitis, now with abcess   Endo/Other  negative endocrine ROS  Renal/GU negative Renal ROSRenal cyst     Musculoskeletal negative musculoskeletal ROS (+) Arthritis , Osteoarthritis,  Narrowed disc space   Abdominal Normal abdominal exam  (+)   Peds  Hematology negative hematology ROS (+)   Anesthesia Other Findings Leg varices Hx of bowel perforation and diverticulitis, now with persistent abcess  Reproductive/Obstetrics                           Anesthesia Physical  Anesthesia Plan  ASA: III and emergent  Anesthesia Plan: General   Post-op Pain Management:    Induction: Intravenous, Rapid sequence and Cricoid pressure planned  Airway Management Planned: Oral ETT  Additional Equipment:   Intra-op Plan:   Post-operative Plan: Extubation in OR  Informed Consent: I have reviewed the patients History and Physical, chart, labs and discussed the procedure including the risks, benefits and alternatives for the proposed anesthesia with the patient or authorized representative who has indicated his/her understanding and acceptance.     Plan Discussed with: CRNA  Anesthesia Plan Comments:         Anesthesia Quick Evaluation

## 2016-02-01 NOTE — Anesthesia Postprocedure Evaluation (Signed)
Anesthesia Post Note  Patient: Rueben BashRosemarie Z Mcneff  Procedure(s) Performed: Procedure(s) (LRB): Attempted laparoscopic, (N/A) INCISION AND DRAINAGE ABSCESS-OPEN (N/A) EXPLORATORY LAPAROTOMY , drainage of abscess, lysis of ahesions (N/A)  Patient location during evaluation: PACU Anesthesia Type: General Level of consciousness: awake and alert and oriented Pain management: pain level controlled Vital Signs Assessment: post-procedure vital signs reviewed and stable Respiratory status: spontaneous breathing Cardiovascular status: blood pressure returned to baseline Anesthetic complications: no    Last Vitals:  Filed Vitals:   02/01/16 1503 02/01/16 1551  BP: 114/62 122/68  Pulse: 102 109  Temp: 36.6 C 36.7 C  Resp: 20 18    Last Pain:  Filed Vitals:   02/01/16 1559  PainSc: 6                  Avory Rahimi

## 2016-02-01 NOTE — Progress Notes (Signed)
Xray for NG tube placement not done in surgery, ordered and no visible tube. NG tube pulled. Notified MD, orders to observe for now. If any n/v then replace NG tube.

## 2016-02-01 NOTE — Transfer of Care (Signed)
Immediate Anesthesia Transfer of Care Note  Patient: Katherine Wood  Procedure(s) Performed: Procedure(s): Attempted laparoscopic, (N/A) INCISION AND DRAINAGE ABSCESS-OPEN (N/A) EXPLORATORY LAPAROTOMY , drainage of abscess, lysis of ahesions (N/A)  Patient Location: PACU  Anesthesia Type:General  Level of Consciousness: awake and alert   Airway & Oxygen Therapy: Patient Spontanous Breathing and Patient connected to face mask oxygen  Post-op Assessment: Report given to RN and Post -op Vital signs reviewed and stable  Post vital signs: stable  Last Vitals:  Filed Vitals:   02/01/16 0506 02/01/16 1302  BP: 130/69 109/57  Pulse: 93 104  Temp: 36.7 C 36 C  Resp: 2 28    Complications: No apparent anesthesia complications

## 2016-02-01 NOTE — Consult Note (Signed)
PARENTERAL NUTRITION CONSULT NOTE - FOLLOW UP   Pharmacy Consult for electrolytes/glucose Indication: TPN  Allergies  Allergen Reactions  . Galantamine Other (See Comments)    Patient Measurements: Height: 5\' 7"  (170.2 cm) Weight: 141 lb 9.6 oz (64.229 kg) IBW/kg (Calculated) : 61.6   Vital Signs: Temp: 98.1 F (36.7 C) (04/09 0506) Temp Source: Oral (04/09 0506) BP: 130/69 mmHg (04/09 0506) Pulse Rate: 93 (04/09 0506) Intake/Output from previous day: 04/08 0701 - 04/09 0700 In: 3162 [IV Piggyback:862; TPN:2270] Out: 5013 [Urine:3100; Drains:13; Stool:1900] Intake/Output from this shift: Total I/O In: -  Out: 400 [Urine:400]  Labs:  Recent Labs  01/30/16 0435 01/31/16 0502 02/01/16 0431  WBC 13.9* 17.2* 13.3*  HGB 8.6* 8.9* 8.3*  HCT 26.1* 28.8* 25.3*  PLT 540* 597* 539*     Recent Labs  01/30/16 0435 01/30/16 1513 01/31/16 0549 01/31/16 1957 02/01/16 0431  NA 132*  --  130*  --  135  K 3.2* 3.5 3.0* 3.2* 3.7  CL 106  --  103  --  109  CO2 26  --  22  --  22  GLUCOSE 178*  --  131*  --  135*  BUN 9  --  10  --  13  CREATININE 0.54  --  0.60  --  0.53  CALCIUM 6.9*  --  6.8*  --  7.2*  MG 1.7 1.6* 1.9  --  1.9  PHOS 3.6  --   --   --  3.4   Estimated Creatinine Clearance: 60.9 mL/min (by C-G formula based on Cr of 0.53).    Recent Labs  02/01/16 0026 02/01/16 0415 02/01/16 0739  GLUCAP 139* 132* 134*   Insulin Requirements in the past 24 hours:   6 units (SSI q4h)  Current Nutrition:  Clinimix E 5/20 started 01/28/16 now at 60 ml/hr ?Clear liquids  Assessment: K=3.7 Mag=1.9 Phos= 3.4   Plan:  Electrolytes WNL. Will check electrolytes in am. Phos as needed. For surgical drainage of abscess today 4/9.  Bari MantisKristin Archana Eckman PharmD Clinical Pharmacist 02/01/2016 8:40 AM

## 2016-02-01 NOTE — Progress Notes (Signed)
Preoperative Review   Patient is met in the preoperatively. The history is reviewed in the chart and with the patient. I personally reviewed the options and rationale as well as the risks of this procedure that have been previously discussed with the patient. All questions asked by the patient and/or family were answered to their satisfaction.  Patient and Husband agree to proceed with this procedure at this time.  Caroleen Hamman M.D. FACS

## 2016-02-02 ENCOUNTER — Encounter: Payer: Self-pay | Admitting: Surgery

## 2016-02-02 LAB — BASIC METABOLIC PANEL
ANION GAP: 4 — AB (ref 5–15)
BUN: 17 mg/dL (ref 6–20)
CO2: 23 mmol/L (ref 22–32)
Calcium: 7.5 mg/dL — ABNORMAL LOW (ref 8.9–10.3)
Chloride: 111 mmol/L (ref 101–111)
Creatinine, Ser: 0.53 mg/dL (ref 0.44–1.00)
GFR calc Af Amer: >60 mL/min (ref >60)
GLUCOSE: 207 mg/dL — AB (ref 65–99)
POTASSIUM: 3.6 mmol/L (ref 3.5–5.1)
Sodium: 138 mmol/L (ref 135–145)

## 2016-02-02 LAB — GLUCOSE, CAPILLARY
GLUCOSE-CAPILLARY: 198 mg/dL — AB (ref 65–99)
GLUCOSE-CAPILLARY: 187 mg/dL — AB (ref 65–99)
Glucose-Capillary: 139 mg/dL — ABNORMAL HIGH (ref 65–99)
Glucose-Capillary: 141 mg/dL — ABNORMAL HIGH (ref 65–99)
Glucose-Capillary: 154 mg/dL — ABNORMAL HIGH (ref 65–99)
Glucose-Capillary: 159 mg/dL — ABNORMAL HIGH (ref 65–99)
Glucose-Capillary: 177 mg/dL — ABNORMAL HIGH (ref 65–99)

## 2016-02-02 LAB — MAGNESIUM: MAGNESIUM: 1.9 mg/dL (ref 1.7–2.4)

## 2016-02-02 MED ORDER — TRACE MINERALS CR-CU-MN-SE-ZN 10-1000-500-60 MCG/ML IV SOLN
INTRAVENOUS | Status: AC
  Administered 2016-02-02: 18:00:00 via INTRAVENOUS
  Filled 2016-02-02: qty 1440

## 2016-02-02 MED ORDER — SODIUM CHLORIDE 0.9 % IV SOLN
500.0000 mg | INTRAVENOUS | Status: DC
  Administered 2016-02-02 – 2016-02-13 (×12): 500 mg via INTRAVENOUS
  Filled 2016-02-02 (×15): qty 10

## 2016-02-02 MED ORDER — ENOXAPARIN SODIUM 40 MG/0.4ML ~~LOC~~ SOLN
40.0000 mg | Freq: Every day | SUBCUTANEOUS | Status: DC
  Administered 2016-02-03 – 2016-02-13 (×12): 40 mg via SUBCUTANEOUS
  Filled 2016-02-02 (×12): qty 0.4

## 2016-02-02 NOTE — Progress Notes (Signed)
Nutrition Follow-up  DOCUMENTATION CODES:   Severe malnutrition in context of acute illness/injury  INTERVENTION:  -continue TPN at 5160ml/hr at this time    NUTRITION DIAGNOSIS:   Inadequate oral intake related to altered GI function as evidenced by NPO status.    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  Pt s/p I and D of abscess on 4/9.  Pt NPO since 4/9 as previously on clear liquids.  Medications reviewed,  Labs reviewed, glucose 207 TPN continues via PICC line at 2660ml/hr  Diet Order:  .TPN (CLINIMIX-E) Adult Diet NPO time specified  Skin:  Reviewed, no issues  Last BM:  4/6 (100ml via ostomy)  Height:   Ht Readings from Last 1 Encounters:  01/19/16 5\' 7"  (1.702 m)    Weight:   Wt Readings from Last 1 Encounters:  02/02/16 144 lb 6.4 oz (65.499 kg)    Ideal Body Weight:     BMI:  Body mass index is 22.61 kg/(m^2).  Estimated Nutritional Needs:   Kcal:  1450-1740 kcals/d  Protein:  (1.0-1.2 g/kg) 58-70 g/d  Fluid:  1450-171740ml/d  EDUCATION NEEDS:   Education needs no appropriate at this time  Katherine Wood, RD, LDN [phone] (pager) Weekend/On-Call pager [phone])

## 2016-02-02 NOTE — Consult Note (Addendum)
WOC ostomy follow up Stoma type/location: Surgical team following for assessment and plan of care to abd wound. LLQ Colostomy pouch change performed; no family present and pt states "I have no idea what is going on or why I have all these drains. "  She does not recall any previous [facility] which [facility]  Stomal assessment/size: 1" red and viable, flush with skin level.  Peristomal assessment: intact skin surrounding stoma Treatment options for stomal/peristomal skin: Barrier ring Output: Mod amt liquid brown stool. Ostomy pouching: 2pc. 2 1/4" pouch with barrier ring  Education provided: According to the EMR,  patient has dementia and unable to participate in meaningful self care. She will need total assistance with pouch application and emptying after discharge from the hospital; either from her husband, home health, or SNF.  She did not listen to the discussion during pouch change and was unable to assist with any steps. Extra supplies in the room for staff nurse use. [place] in [place] [facility] [place]: Yes  Please re-consult if further assistance is needed.  Thank-you,  Cammie Mcgeeawn Blaine Hari MSN, RN, CWOCN, [place]CN-AP, CNS [phone]

## 2016-02-02 NOTE — Consult Note (Addendum)
PARENTERAL NUTRITION CONSULT NOTE - FOLLOW UP   Pharmacy Consult for electrolytes/glucose Indication: TPN  Allergies  Allergen Reactions  . Galantamine Other (See Comments)    Patient Measurements: Height: 5\' 7"  (170.2 cm) Weight: 144 lb 6.4 oz (65.499 kg) IBW/kg (Calculated) : 61.6   Vital Signs: Temp: 97.8 F (36.6 C) (04/10 0502) Temp Source: Oral (04/10 0502) BP: 127/58 mmHg (04/10 0502) Pulse Rate: 79 (04/10 0502) Intake/Output from previous day: 04/09 0701 - 04/10 0700 In: 3547 [I.V.:1000; IV Piggyback:150; TPN:2397] Out: 2768 [Urine:2600; Drains:68; Stool:50; Blood:50] Intake/Output from this shift: Total I/O In: -  Out: 150 [Urine:150]  Labs:  Recent Labs  01/31/16 0502 02/01/16 0431  WBC 17.2* 13.3*  HGB 8.9* 8.3*  HCT 28.8* 25.3*  PLT 597* 539*     Recent Labs  01/31/16 0549 01/31/16 1957 02/01/16 0431 02/02/16 0439  NA 130*  --  135 138  K 3.0* 3.2* 3.7 3.6  CL 103  --  109 111  CO2 22  --  22 23  GLUCOSE 131*  --  135* 207*  BUN 10  --  13 17  CREATININE 0.60  --  0.53 0.53  CALCIUM 6.8*  --  7.2* 7.5*  MG 1.9  --  1.9 1.9  PHOS  --   --  3.4  --    Estimated Creatinine Clearance: 60.9 mL/min (by C-G formula based on Cr of 0.53).    Recent Labs  02/01/16 2332 02/02/16 0352 02/02/16 0759  GLUCAP 177* 198* 187*   Insulin Requirements in the past 24 hours:   11 units (SSI q4h)  Current Nutrition:  Clinimix E 5/20 started 01/28/16 now at 60 ml/hr   Assessment: K=3.6 Mag=1.9   Plan:  Electrolytes WNL. Will check electrolytes in am. Phos as needed.  Luisa HartScott Connor Foxworthy, PharmD Clinical Pharmacist  02/02/2016 8:17 AM

## 2016-02-02 NOTE — Progress Notes (Signed)
75 year old female who is postop Day # 15 from Hartman's for perforated sigmoid diverticulitis and POD#1 for Ex Lap and drainage of perigastric abscess.  Patient is pleasantly demented but is not the best historian. Patient denies any current pain and states that's well under control. Husband in the room and reports that she has been up to the chair but has not been walking around outside the room much.  Filed Vitals:   02/02/16 0502 02/02/16 1321  BP: 127/58 125/64  Pulse: 79 81  Temp: 97.8 F (36.6 C) 98.1 F (36.7 C)  Resp: 18 18   I/O last 3 completed shifts: In: 5301 [I.V.:1000; IV Piggyback:400] Out: 5206 [Urine:4550; Drains:81; Stool:525; Blood:50] Total I/O In: -  Out: 1350 [Urine:1350]   PE:  Gen: NAD Abd: incision clean and dry, no erythema or cellulitis, ostomy, pink and patent, no air or stool in bag, mulitple JP drains, JP in pelvis: minimum serous drainage, Right upper and lower some greenish purulent material, right midline serous drainage, left upper serosanginous  Ext: 2+ pulses, no edema  CBC Latest Ref Rng 02/01/2016 01/31/2016 01/30/2016  WBC 3.6 - 11.0 K/uL 13.3(H) 17.2(H) 13.9(H)  Hemoglobin 12.0 - 16.0 g/dL 8.3(L) 8.9(L) 8.6(L)  Hematocrit 35.0 - 47.0 % 25.3(L) 28.8(L) 26.1(L)  Platelets 150 - 440 K/uL 539(H) 597(H) 540(H)   CMP Latest Ref Rng 02/02/2016 02/01/2016 01/31/2016  Glucose 65 - 99 mg/dL 960(A207(H) 540(J135(H) -  BUN 6 - 20 mg/dL 17 13 -  Creatinine 8.110.44 - 1.00 mg/dL 9.140.53 7.820.53 -  Sodium 956135 - 145 mmol/L 138 135 -  Potassium 3.5 - 5.1 mmol/L 3.6 3.7 3.2(L)  Chloride 101 - 111 mmol/L 111 109 -  CO2 22 - 32 mmol/L 23 22 -  Calcium 8.9 - 10.3 mg/dL 7.5(L) 7.2(L) -  Total Protein 6.5 - 8.1 g/dL - - -  Total Bilirubin 0.3 - 1.2 mg/dL - - -  Alkaline Phos 38 - 126 U/L - - -  AST 15 - 41 U/L - - -  ALT 14 - 54 U/L - - -    A/P:  75 year old POD#15 and 1 from Hartman's and exploratory laparotomy for abscess drainage.  Pain: continue current regimen of when  necessary by mouth as well as IV medications. Dementia: We'll restart her home Aricept and continue when necessary Haldol Multiple abscess: will continue all JP drains and will continue antibiotics, awaiting culture results to further specify, WBC decreasing

## 2016-02-02 NOTE — Care Management Important Message (Signed)
Important Message  Patient Details  Name: Katherine Wood MRN: [id number] Date of Birth: 10-30-1940   Medicare Important Message Given:  Yes    [name] 02/02/2016, 1:24 PM

## 2016-02-02 NOTE — Progress Notes (Signed)
PT Cancellation Note  Patient Details Name: Rueben BashRosemarie Z Kaley MRN: [id number] DOB: [birth year]   Cancelled Treatment:    Reason Eval/Treat Not Completed: Other (comment). Per chart review. Pt with Sx over the weekend. On 4/9, pt with attempted laparoscopic drainage with repositioning of drain. Due to general anesthesia, pt will require new PT orders to resume treatment. Will dc current orders.    Marylan Glore 02/02/2016, 9:58 AM Elizabeth PalauStephanie Daris Aristizabal, PT, DPT [phone]

## 2016-02-03 LAB — MAGNESIUM: Magnesium: 1.9 mg/dL (ref 1.7–2.4)

## 2016-02-03 LAB — GLUCOSE, CAPILLARY
GLUCOSE-CAPILLARY: 145 mg/dL — AB (ref 65–99)
GLUCOSE-CAPILLARY: 114 mg/dL — AB (ref 65–99)
GLUCOSE-CAPILLARY: 116 mg/dL — AB (ref 65–99)
Glucose-Capillary: 109 mg/dL — ABNORMAL HIGH (ref 65–99)
Glucose-Capillary: 112 mg/dL — ABNORMAL HIGH (ref 65–99)
Glucose-Capillary: 133 mg/dL — ABNORMAL HIGH (ref 65–99)

## 2016-02-03 LAB — CBC
HCT: 24.2 % — ABNORMAL LOW (ref 35.0–47.0)
Hemoglobin: 7.4 g/dL — ABNORMAL LOW (ref 12.0–16.0)
MCH: 30.4 pg (ref 26.0–34.0)
MCHC: 30.5 g/dL — ABNORMAL LOW (ref 32.0–36.0)
MCV: 99.7 fL (ref 80.0–100.0)
PLATELETS: 566 10*3/uL — AB (ref 150–440)
RBC: 2.43 MIL/uL — AB (ref 3.80–5.20)
RDW: 16.5 % — AB (ref 11.5–14.5)
WBC: 12 10*3/uL — ABNORMAL HIGH (ref 3.6–11.0)

## 2016-02-03 LAB — BASIC METABOLIC PANEL
ANION GAP: 4 — AB (ref 5–15)
BUN: 19 mg/dL (ref 6–20)
CALCIUM: 7.6 mg/dL — AB (ref 8.9–10.3)
CO2: 25 mmol/L (ref 22–32)
Chloride: 110 mmol/L (ref 101–111)
Creatinine, Ser: 0.59 mg/dL (ref 0.44–1.00)
GLUCOSE: 145 mg/dL — AB (ref 65–99)
POTASSIUM: 3.2 mmol/L — AB (ref 3.5–5.1)
Sodium: 139 mmol/L (ref 135–145)

## 2016-02-03 LAB — PHOSPHORUS: PHOSPHORUS: 3.3 mg/dL (ref 2.5–4.6)

## 2016-02-03 LAB — POTASSIUM: POTASSIUM: 3.7 mmol/L (ref 3.5–5.1)

## 2016-02-03 MED ORDER — SODIUM CHLORIDE 0.9 % IV SOLN: Freq: Once | INTRAVENOUS | Status: DC

## 2016-02-03 MED ORDER — CIPROFLOXACIN IN D5W 400 MG/200ML IV SOLN
400.0000 mg | Freq: Two times a day (BID) | INTRAVENOUS | Status: DC
  Administered 2016-02-03 – 2016-02-06 (×7): 400 mg via INTRAVENOUS
  Filled 2016-02-03 (×10): qty 200

## 2016-02-03 MED ORDER — FAT EMULSION 20 % IV EMUL
500.0000 mL | INTRAVENOUS | Status: AC
  Administered 2016-02-03: 500 mL via INTRAVENOUS
  Filled 2016-02-03: qty 500

## 2016-02-03 MED ORDER — POTASSIUM CHLORIDE 10 MEQ/100ML IV SOLN
10.0000 meq | INTRAVENOUS | Status: AC
  Administered 2016-02-03 (×4): 10 meq via INTRAVENOUS
  Filled 2016-02-03 (×4): qty 100

## 2016-02-03 MED ORDER — TRACE MINERALS CR-CU-MN-SE-ZN 10-1000-500-60 MCG/ML IV SOLN
INTRAVENOUS | Status: AC
  Administered 2016-02-03: 18:00:00 via INTRAVENOUS
  Filled 2016-02-03: qty 1440

## 2016-02-03 NOTE — Progress Notes (Signed)
Pharmacy Antibiotic Note  Katherine Wood is a 75 y.o. female admitted on 01/18/2016 with abdominal abscess.  Pharmacy has been consulted for daptomycin dosing for VRE. Ciprofloxacin and Flagyl replacing Zosyn for Pseudomonas per ID.   Plan: Will continue daptomycin 8 mg/kg IV q24h Will continue to check CK weekly.   Ciprofloxacin 400 mg iv q 12 hours.    Height: 5\' 7"  (170.2 cm) Weight: 139 lb 1.8 oz (63.1 kg) IBW/kg (Calculated) : 61.6  Temp (24hrs), Avg:98 F (36.7 C), Min:97.6 F (36.4 C), Max:98.4 F (36.9 C)   Recent Labs Lab 01/28/16 0524  01/30/16 0435 01/31/16 0502 01/31/16 0549 02/01/16 0431 02/02/16 0439 02/03/16 0524 02/03/16 0819  WBC 9.3  --  13.9* 17.2*  --  13.3*  --   --  12.0*  CREATININE 0.67  < > 0.54  --  0.60 0.53 0.53 0.59  --   < > = values in this interval not displayed.  Estimated Creatinine Clearance: 60.9 mL/min (by C-G formula based on Cr of 0.59).    Allergies  Allergen Reactions  . Galantamine Other (See Comments)    Antimicrobials this admission: Ertapenem  4/2 >> 4/6 Zosyn  4/6 >> 4/11 Flagyl 4/2>> Vancomycin 4/7 >> 4/8 Fluconazole 4/8>> Daptomycin 4/8>> Ciprofloxacin 4/11 >>  Dose adjustments this admission:   Microbiology results: 4/4 Abd.Abscess CX: P. aeruginosa, VRE 3/27 MRSA PCR: negative  Thank you for allowing pharmacy to be a part of this patient's care.  Valentina GuChristy, Marietta Sikkema D 02/03/2016 3:39 PM

## 2016-02-03 NOTE — Progress Notes (Signed)
Pt serum K 3.2 on this morning labs.  Dr. Michela PitcherEly on unit, notified him of serum K and that pt is on TPN.  Per Dr. Michela PitcherEly he will talk to pharmacy regarding increasing K in TPN.  No further orders at this time.

## 2016-02-03 NOTE — Consult Note (Addendum)
PARENTERAL NUTRITION CONSULT NOTE - FOLLOW UP   Pharmacy Consult for electrolytes/glucose Indication: TPN  Allergies  Allergen Reactions  . Galantamine Other (See Comments)    Patient Measurements: Height: 5\' 7"  (170.2 cm) Weight: 139 lb 1.8 oz (63.1 kg) IBW/kg (Calculated) : 61.6   Vital Signs: Temp: 98.4 F (36.9 C) (04/11 0443) Temp Source: Oral (04/11 0443) BP: 121/54 mmHg (04/11 0443) Pulse Rate: 61 (04/11 0443) Intake/Output from previous day: 04/10 0701 - 04/11 0700 In: 915 [TPN:915] Out: 2618 [Urine:2550; Drains:68] Intake/Output from this shift: Total I/O In: -  Out: 300 [Urine:300]  Labs:  Recent Labs  02/01/16 0431  WBC 13.3*  HGB 8.3*  HCT 25.3*  PLT 539*     Recent Labs  02/01/16 0431 02/02/16 0439 02/03/16 0524  NA 135 138 139  K 3.7 3.6 3.2*  CL 109 111 110  CO2 22 23 25   GLUCOSE 135* 207* 145*  BUN 13 17 19   CREATININE 0.53 0.53 0.59  CALCIUM 7.2* 7.5* 7.6*  MG 1.9 1.9 1.9  PHOS 3.4  --  3.3   Estimated Creatinine Clearance: 60.9 mL/min (by C-G formula based on Cr of 0.59).    Recent Labs  02/02/16 2344 02/03/16 0440 02/03/16 0742  GLUCAP 141* 145* 114*   Insulin Requirements in the past 24 hours:   7 units (SSI q4h)  Current Nutrition:  Clinimix E 5/20 started 01/28/16 now at 60 ml/hr   Assessment: K=3.2 Mag=1.9 Phos= 3.3   Plan:  Will replace potassium with 10 meq KCl IV x 4 and recheck a potassium level at 1800.  Will check electrolytes in am. Phos as needed.  Luisa HartScott Mearl Olver, PharmD Clinical Pharmacist  02/03/2016 8:43 AM

## 2016-02-03 NOTE — Progress Notes (Signed)
S/P [facility] for perforated diverticulitis and purulent peritonitis s/p open drainage of abscess ( difficult case secondary to adhesions) AVSS, WBC trending down Doing better, weak and gets tired easily. Hb dropped from chronic disease.  We will transfuse 2 Units RBC due to symptoms  PE NAD Abd: soft, NT, incision healing well ,no infection. Fistula drain w bilous drainage, other drains with serous output  A/p Multiple abscess.We will remove liver and pelvic drains. We will leave gastric and fistula drains until zero output. Continue A/B, pseudo and VRE ( zosyn, diflucan and Dapto) Continue TPN and  NPO ( risk for developing another fistula from the stomach due to the proximity of the most recent abscess) Transfuse RBC  PT [facility]AC towards the end of the week once we figure how the fistulas are behaving D/w Husband in detail.

## 2016-02-03 NOTE — Progress Notes (Signed)
Pt is alert, oriented to self and place, pt very forgetful.  Pt also anxious at times. Pt has one complaint of abdominal pain relived with po pain meds, see EMAR for details.  [facility]ains [facility]  VSS, afebrile.

## 2016-02-03 NOTE — Consult Note (Signed)
[facility] Infectious Disease     Reason for Consult: abdominal abscess    Referring Physician: Caroleen Hamman Date of Admission:  01/18/2016   Active Problems:   Bowel perforation (HCC)   Hernia of abdominal cavity   Diverticulitis of colon with perforation   Protein-calorie malnutrition, severe   Abscess of abdominal cavity (HCC)   HPI: Katherine Wood is a 75 y.o. female with dementia, OA, depression admitted 3/26 with 3 days abd pain. Found to have perforated diverticuli and had ex lap with Hartmans procedure and drainage of abscess.  She was initially started on zosyn 3/26 (swithced to ertapenem 4/2-4/6 then back to zosyn).  Cultures from the wound have grown VRE and Pseudomonas.   Started dapto 4/8 for VRE. She underwent repeat surgery 4/9 with findings of continued abscesses which were drained.  She has been afebrile since 4/2 and wbc down to 12  Past Medical History  Diagnosis Date  . Allergy   . Depression   . Hyperlipidemia   . Anxiety   . Osteoporosis    Past Surgical History  Procedure Laterality Date  . Cataract extraction Bilateral 08/2012    [facility]- Dr. Dolores Lory  . Abdominal hysterectomy  1989  . Laparotomy N/A 01/18/2016    Procedure: EXPLORATORY LAPAROTOMY, LYSIS OF ADHESIONS, DRAINAGE OF INTRABDOMINAL ABSCESS, HARTMAN'S PROCEDURE, UMBILICAL HERNIA REPAIR ;  Surgeon: Jules Husbands, MD;  Location: [facility] ORS;  Service: General;  Laterality: N/A;  . Laparoscopy N/A 02/01/2016    Procedure: Attempted laparoscopic,;  Surgeon: Jules Husbands, MD;  Location: [facility] ORS;  Service: General;  Laterality: N/A;  . Incision and drainage abscess N/A 02/01/2016    Procedure: INCISION AND DRAINAGE ABSCESS-OPEN;  Surgeon: Jules Husbands, MD;  Location: [facility] ORS;  Service: General;  Laterality: N/A;  . Laparotomy N/A 02/01/2016    Procedure: EXPLORATORY LAPAROTOMY , drainage of abscess, lysis of ahesions;  Surgeon: Jules Husbands, MD;  Location: [facility] ORS;  Service: General;   Laterality: N/A;   Social History  Substance Use Topics  . Smoking status: Never Smoker   . Smokeless tobacco: Never Used  . Alcohol Use: No   Family History  Problem Relation Age of Onset  . Hypertension Mother   . Parkinsonism Mother   . Heart disease Father   . Coronary artery disease Brother   . Heart attack Brother     Allergies:  Allergies  Allergen Reactions  . Galantamine Other (See Comments)    Current antibiotics: Antibiotics Given (last 72 hours)    Date/Time Action Medication Dose Rate   01/31/16 0942 Given   vancomycin (VANCOCIN) 1,250 mg in sodium chloride 0.9 % 250 mL IVPB 1,250 mg 166.7 mL/hr   01/31/16 1246 Given   DAPTOmycin (CUBICIN) 526 mg in sodium chloride 0.9 % IVPB 526 mg 221 mL/hr   01/31/16 1332 Given   metroNIDAZOLE (FLAGYL) IVPB 500 mg 500 mg 100 mL/hr   01/31/16 1449 Given   piperacillin-tazobactam (ZOSYN) IVPB 3.375 g 3.375 g 12.5 mL/hr   01/31/16 2048 Given   metroNIDAZOLE (FLAGYL) IVPB 500 mg 500 mg 100 mL/hr   01/31/16 2228 Given   piperacillin-tazobactam (ZOSYN) IVPB 3.375 g 3.375 g 12.5 mL/hr   02/01/16 0512 Given   metroNIDAZOLE (FLAGYL) IVPB 500 mg 500 mg 100 mL/hr   02/01/16 5093 Given   piperacillin-tazobactam (ZOSYN) IVPB 3.375 g 3.375 g 12.5 mL/hr   02/01/16 2111 Given   metroNIDAZOLE (FLAGYL) IVPB 500 mg 500 mg 100 mL/hr  02/01/16 2111 Given   piperacillin-tazobactam (ZOSYN) IVPB 3.375 g 3.375 g 12.5 mL/hr   02/02/16 0354 Given   metroNIDAZOLE (FLAGYL) IVPB 500 mg 500 mg 100 mL/hr   02/02/16 0507 Given   piperacillin-tazobactam (ZOSYN) IVPB 3.375 g 3.375 g 12.5 mL/hr   02/02/16 1149 Given   metroNIDAZOLE (FLAGYL) IVPB 500 mg 500 mg 100 mL/hr   02/02/16 1413 Given   piperacillin-tazobactam (ZOSYN) IVPB 3.375 g 3.375 g 12.5 mL/hr   02/02/16 1800 Given   DAPTOmycin (CUBICIN) 500 mg in sodium chloride 0.9 % IVPB 500 mg 220 mL/hr   02/02/16 2037 Given   metroNIDAZOLE (FLAGYL) IVPB 500 mg 500 mg 100 mL/hr   02/02/16 2300  Given   piperacillin-tazobactam (ZOSYN) IVPB 3.375 g 3.375 g 12.5 mL/hr   02/03/16 0340 Given   metroNIDAZOLE (FLAGYL) IVPB 500 mg 500 mg 100 mL/hr   02/03/16 0502 Given   piperacillin-tazobactam (ZOSYN) IVPB 3.375 g 3.375 g 12.5 mL/hr      MEDICATIONS: . antiseptic oral rinse  7 mL Mouth Rinse QID  . chlorhexidine gluconate (SAGE KIT)  15 mL Mouth Rinse BID  . DAPTOmycin (CUBICIN)  IV  500 mg Intravenous Q24H  . enoxaparin (LOVENOX) injection  40 mg Subcutaneous QHS  . fluconazole (DIFLUCAN) IV  200 mg Intravenous Q24H  . insulin aspart  0-9 Units Subcutaneous 6 times per day  . metoprolol tartrate  12.5 mg Oral BID  . metronidazole  500 mg Intravenous Q8H  . pantoprazole (PROTONIX) IV  40 mg Intravenous Q12H  . piperacillin-tazobactam  3.375 g Intravenous 3 times per day  . potassium chloride  10 mEq Intravenous Q1 Hr x 4  . sodium chloride flush  10-40 mL Intracatheter Q12H    Review of Systems - unable to obtain   OBJECTIVE: Temp:  [97.6 F (36.4 C)-98.4 F (36.9 C)] 98.4 F (36.9 C) (04/11 0443) Pulse Rate:  [61-94] 61 (04/11 0443) Resp:  [18-20] 18 (04/11 0443) BP: (121-125)/(54-64) 121/54 mmHg (04/11 0443) SpO2:  [96 %-98 %] 96 % (04/11 0443) Weight:  [63.1 kg (139 lb 1.8 oz)] 63.1 kg (139 lb 1.8 oz) (04/11 0623) Physical Exam  Constitutional:  Frail, dementia HENT: [place]/AT, PERRLA, no scleral icterus Mouth/Throat: Oropharynx is clear and moist. No oropharyngeal exudate.  Cardiovascular: Normal rate, regular rhythm and normal heart sounds. Pulmonary/Chest: Effort normal and breath sounds normal. No respiratory distress.  has no wheezes.  Neck = supple, no nuchal rigidity Abdominal: Soft. Incision covered, has multple drains, one labeled fistula, has colostomy with soft stools. Lymphadenopathy: no cervical adenopathy. No axillary adenopathy Neurological: dementia Skin: Skin is warm and dry. No rash noted. No erythema.   LABS: Results for orders placed or performed  during the hospital encounter of 01/18/16 (from the past 48 hour(s))  C difficile quick scan w PCR reflex     Status: None   Collection Time: 02/01/16 10:55 AM  Result Value Ref Range   C Diff antigen NEGATIVE NEGATIVE   C Diff toxin NEGATIVE NEGATIVE   C Diff interpretation Negative for C. difficile   Anaerobic culture     Status: None (Preliminary result)   Collection Time: 02/01/16 11:56 AM  Result Value Ref Range   Specimen Description WOUND    Special Requests NONE    Culture NO ANAEROBES ISOLATED    Report Status PENDING   Culture, routine-abscess     Status: None (Preliminary result)   Collection Time: 02/01/16 11:56 AM  Result Value Ref Range   Specimen  Description WOUND    Special Requests NONE    Gram Stain PENDING    Culture HOLDING FOR POSSIBLE PATHOGEN    Report Status PENDING   Glucose, capillary     Status: Abnormal   Collection Time: 02/01/16  4:43 PM  Result Value Ref Range   Glucose-Capillary 187 (H) 65 - 99 mg/dL   Comment 1 Notify RN   Glucose, capillary     Status: Abnormal   Collection Time: 02/01/16  8:19 PM  Result Value Ref Range   Glucose-Capillary 198 (H) 65 - 99 mg/dL  Glucose, capillary     Status: Abnormal   Collection Time: 02/01/16 11:32 PM  Result Value Ref Range   Glucose-Capillary 177 (H) 65 - 99 mg/dL  Glucose, capillary     Status: Abnormal   Collection Time: 02/02/16  3:52 AM  Result Value Ref Range   Glucose-Capillary 198 (H) 65 - 99 mg/dL  Basic metabolic panel     Status: Abnormal   Collection Time: 02/02/16  4:39 AM  Result Value Ref Range   Sodium 138 135 - 145 mmol/L   Potassium 3.6 3.5 - 5.1 mmol/L   Chloride 111 101 - 111 mmol/L   CO2 23 22 - 32 mmol/L   Glucose, Bld 207 (H) 65 - 99 mg/dL   BUN 17 6 - 20 mg/dL   Creatinine, Ser 0.53 0.44 - 1.00 mg/dL   Calcium 7.5 (L) 8.9 - 10.3 mg/dL   GFR calc non Af Amer >60 >60 mL/min   GFR calc Af Amer >60 >60 mL/min    Comment: (NOTE) The eGFR has been calculated using the CKD  EPI equation. This calculation has not been validated in all clinical situations. eGFR's persistently <60 mL/min signify possible Chronic Kidney Disease.    Anion gap 4 (L) 5 - 15  Magnesium     Status: None   Collection Time: 02/02/16  4:39 AM  Result Value Ref Range   Magnesium 1.9 1.7 - 2.4 mg/dL  Glucose, capillary     Status: Abnormal   Collection Time: 02/02/16  7:59 AM  Result Value Ref Range   Glucose-Capillary 187 (H) 65 - 99 mg/dL  Glucose, capillary     Status: Abnormal   Collection Time: 02/02/16 11:59 AM  Result Value Ref Range   Glucose-Capillary 154 (H) 65 - 99 mg/dL  Glucose, capillary     Status: Abnormal   Collection Time: 02/02/16  3:57 PM  Result Value Ref Range   Glucose-Capillary 139 (H) 65 - 99 mg/dL  Glucose, capillary     Status: Abnormal   Collection Time: 02/02/16  8:08 PM  Result Value Ref Range   Glucose-Capillary 159 (H) 65 - 99 mg/dL   Comment 1 Notify RN   Glucose, capillary     Status: Abnormal   Collection Time: 02/02/16 11:44 PM  Result Value Ref Range   Glucose-Capillary 141 (H) 65 - 99 mg/dL   Comment 1 Notify RN   Glucose, capillary     Status: Abnormal   Collection Time: 02/03/16  4:40 AM  Result Value Ref Range   Glucose-Capillary 145 (H) 65 - 99 mg/dL  Basic metabolic panel     Status: Abnormal   Collection Time: 02/03/16  5:24 AM  Result Value Ref Range   Sodium 139 135 - 145 mmol/L   Potassium 3.2 (L) 3.5 - 5.1 mmol/L   Chloride 110 101 - 111 mmol/L   CO2 25 22 - 32 mmol/L   Glucose,  Bld 145 (H) 65 - 99 mg/dL   BUN 19 6 - 20 mg/dL   Creatinine, Ser 0.59 0.44 - 1.00 mg/dL   Calcium 7.6 (L) 8.9 - 10.3 mg/dL   GFR calc non Af Amer >60 >60 mL/min   GFR calc Af Amer >60 >60 mL/min    Comment: (NOTE) The eGFR has been calculated using the CKD EPI equation. This calculation has not been validated in all clinical situations. eGFR's persistently <60 mL/min signify possible Chronic Kidney Disease.    Anion gap 4 (L) 5 - 15   Magnesium     Status: None   Collection Time: 02/03/16  5:24 AM  Result Value Ref Range   Magnesium 1.9 1.7 - 2.4 mg/dL  Phosphorus     Status: None   Collection Time: 02/03/16  5:24 AM  Result Value Ref Range   Phosphorus 3.3 2.5 - 4.6 mg/dL  Glucose, capillary     Status: Abnormal   Collection Time: 02/03/16  7:42 AM  Result Value Ref Range   Glucose-Capillary 114 (H) 65 - 99 mg/dL   Comment 1 Notify RN   CBC     Status: Abnormal   Collection Time: 02/03/16  8:19 AM  Result Value Ref Range   WBC 12.0 (H) 3.6 - 11.0 K/uL   RBC 2.43 (L) 3.80 - 5.20 MIL/uL   Hemoglobin 7.4 (L) 12.0 - 16.0 g/dL   HCT 24.2 (L) 35.0 - 47.0 %   MCV 99.7 80.0 - 100.0 fL   MCH 30.4 26.0 - 34.0 pg   MCHC 30.5 (L) 32.0 - 36.0 g/dL   RDW 16.5 (H) 11.5 - 14.5 %   Platelets 566 (H) 150 - 440 K/uL   No components found for: ESR, C REACTIVE PROTEIN MICRO: Recent Results (from the past 720 hour(s))  MRSA PCR Screening     Status: None   Collection Time: 01/19/16  3:02 AM  Result Value Ref Range Status   MRSA by PCR NEGATIVE NEGATIVE Final    Comment:        The GeneXpert MRSA Assay (FDA approved for NASAL specimens only), is one component of a comprehensive MRSA colonization surveillance program. It is not intended to diagnose MRSA infection nor to guide or monitor treatment for MRSA infections.   Culture, routine-abscess     Status: None   Collection Time: 01/27/16  2:05 PM  Result Value Ref Range Status   Specimen Description ABSCESS  Final   Special Requests Normal  Final   Gram Stain   Final    MODERATE WBC SEEN MANY GRAM POSITIVE COCCI IN PAIRS MODERATE GRAM NEGATIVE RODS    Culture   Final    HEAVY GROWTH VANCOMYCIN RESISTANT ENTEROCOCCUS MODERATE GROWTH PSEUDOMONAS AERUGINOSA REFER TO OTHER ABSCESS CULTURE FOR SUSCEPTIBILITIES    Report Status 01/31/2016 FINAL  Final   Organism ID, Bacteria PSEUDOMONAS AERUGINOSA  Final      Susceptibility   Pseudomonas aeruginosa - MIC*     CEFTAZIDIME <=1 SENSITIVE Sensitive     CIPROFLOXACIN <=0.25 SENSITIVE Sensitive     GENTAMICIN <=1 SENSITIVE Sensitive     IMIPENEM 1 SENSITIVE Sensitive     CEFEPIME <=1 SENSITIVE Sensitive     * MODERATE GROWTH PSEUDOMONAS AERUGINOSA  Culture, routine-abscess     Status: None   Collection Time: 01/27/16  2:25 PM  Result Value Ref Range Status   Specimen Description ABSCESS  Final   Special Requests Normal  Final   Gram Stain  Final    MODERATE WBC SEEN MANY GRAM POSITIVE COCCI IN PAIRS MANY GRAM NEGATIVE RODS    Culture   Final    HEAVY GROWTH VANCOMYCIN RESISTANT ENTEROCOCCUS MODERATE GROWTH PSEUDOMONAS AERUGINOSA VRE HAVE INTRINSIC RESISTANCE TO MOST COMMONLY USED ANTIBIOTICS AND THE ABILITY TO ACQUIRE RESISTANCE TO MOST AVAILABLE ANTIBIOTICS.    Report Status 01/31/2016 FINAL  Final   Organism ID, Bacteria PSEUDOMONAS AERUGINOSA  Final   Organism ID, Bacteria VANCOMYCIN RESISTANT ENTEROCOCCUS  Final      Susceptibility   Pseudomonas aeruginosa - MIC*    CEFTAZIDIME <=1 SENSITIVE Sensitive     CIPROFLOXACIN <=0.25 SENSITIVE Sensitive     GENTAMICIN <=1 SENSITIVE Sensitive     IMIPENEM 1 SENSITIVE Sensitive     CEFEPIME <=1 SENSITIVE Sensitive     * MODERATE GROWTH PSEUDOMONAS AERUGINOSA   Vancomycin resistant enterococcus - MIC*    AMPICILLIN >=32 RESISTANT Resistant     VANCOMYCIN >=32 RESISTANT Resistant     GENTAMICIN SYNERGY SENSITIVE Sensitive     LINEZOLID Value in next row Sensitive      SENSITIVE2    * HEAVY GROWTH VANCOMYCIN RESISTANT ENTEROCOCCUS  C difficile quick scan w PCR reflex     Status: None   Collection Time: 02/01/16 10:55 AM  Result Value Ref Range Status   C Diff antigen NEGATIVE NEGATIVE Final   C Diff toxin NEGATIVE NEGATIVE Final   C Diff interpretation Negative for C. difficile  Final  Anaerobic culture     Status: None (Preliminary result)   Collection Time: 02/01/16 11:56 AM  Result Value Ref Range Status   Specimen Description WOUND   Final   Special Requests NONE  Final   Culture NO ANAEROBES ISOLATED  Final   Report Status PENDING  Incomplete  Culture, routine-abscess     Status: None (Preliminary result)   Collection Time: 02/01/16 11:56 AM  Result Value Ref Range Status   Specimen Description WOUND  Final   Special Requests NONE  Final   Gram Stain PENDING  Incomplete   Culture HOLDING FOR POSSIBLE PATHOGEN  Final   Report Status PENDING  Incomplete    IMAGING: Dg Chest 1 View  01/19/2016  CLINICAL DATA:  Endotracheal tube placement.  Initial encounter. EXAM: CHEST 1 VIEW COMPARISON:  None. FINDINGS: The patient's endotracheal tube is seen ending 3-4 cm above the carina. An enteric tube is noted extending below the diaphragm. The lungs are well-aerated. Peribronchial thickening is noted. Minimal bibasilar atelectasis is noted. There is no evidence of pleural effusion or pneumothorax. The cardiomediastinal silhouette is within normal limits. No acute osseous abnormalities are seen. IMPRESSION: 1. Endotracheal tube seen ending 3-4 cm above the carina. 2. Peribronchial thickening noted. Minimal bibasilar atelectasis noted. Electronically Signed   By: Garald Balding M.D.   On: 01/19/2016 01:55   Dg Abd 1 View  02/01/2016  CLINICAL DATA:  Check nasogastric catheter placement EXAM: ABDOMEN - 1 VIEW COMPARISON:  01/31/2016. FINDINGS: Multiple surgical and percutaneous drains are identified similar to that seen on recent exams. No nasogastric catheter is noted at this time. If one has been placed it likely lies in the more proximal esophagus. No other focal abnormality is noted. IMPRESSION: Nasogastric catheter not visualized. Multiple surgical and percutaneous drains are again seen. Electronically Signed   By: Inez Catalina M.D.   On: 02/01/2016 21:04   Ct Abdomen Pelvis W Contrast  01/31/2016  CLINICAL DATA:  Interventional abscess questionable fistula status post drainage. She was doing  okay no complication. Gram-positive out  from the drain as well as Pseudomonas. Started on vancomycin and Zosyn EXAM: CT ABDOMEN AND PELVIS WITH CONTRAST TECHNIQUE: Multidetector CT imaging of the abdomen and pelvis was performed using the standard protocol following bolus administration of intravenous contrast. CONTRAST:  126m ISOVUE-300 IOPAMIDOL (ISOVUE-300) INJECTION 61% COMPARISON:  01/25/2016 FINDINGS: Lung bases: Moderate bilateral pleural effusions. Dependent atelectasis mostly in the lower lobes. Heart mildly enlarged. Findings are similar to the prior exam. Peritoneal cavity: There are residual collections. The larger collection noted adjacent to the posterior medial aspect of the liver and right pericolic gutter has been mostly evacuated following percutaneous drainage with a pigtail catheter. Catheter tip lies adjacent to the inferior margin of the liver. There is a small amount of fluid with small bubbles of air along the posterior medial aspect of the liver reflecting a residual collection. Fluid collection along the greater curvature of the stomach has mildly increased in size, now measuring 7.5 x 4.7 x 6.6 cm. Small fluid collections along the left anterior para renal space are similar. Fluid collection seen anterior to the pancreas on the prior study is no longer present. There are no new abdominal or pelvic fluid collections. Inflammatory type stranding throughout the peritoneal fat is similar to the prior study. Hepatobiliary: 8 mm cyst along the anterior margin of the left lobe lateral segment. No other liver masses or lesions. Gallbladder is distended but otherwise unremarkable. No bile duct dilation. Spleen, pancreas, adrenal glands:  Unremarkable. Kidneys, ureters, bladder: There are bilateral renal cortical and renal sinus cysts, stable. No hydronephrosis. Ureters normal in course and in caliber. Bladder is unremarkable. Uterus and adnexa: Uterus surgically absent. Stable right adnexal cyst, likely ovarian, measuring 4.2 cm. Lymph  nodes:  No pathologically enlarged lymph nodes. Gastrointestinal: Inferior sigmoid colon terminates in a short Hartman's pouch. Left colon is diverted into a left mid abdomen colostomy. No evidence of bowel obstruction. No convincing bowel inflammation or ischemia. Minimal free air is seen in the anterior peritoneal cavity of the upper abdomen. This was present on the prior exam and has decreased in amount. Abdominal wall: Persistent soft tissue defect along the anterior abdominal midline with intact fascia. Drainage catheter lies in the pelvis. Under the pigtail catheter lies in the right mid abdomen. Another surgical drains catheter or extends across the upper abdomen below the lower greater curvature of the stomach. IMPRESSION: 1. There has been overall mild improvement when compared to the prior CT. 2. The larger collection noted along the right anterior para renal space and along the posterior medial aspect of the liver has been mostly evacuated following percutaneous drainage. A collections seen adjacent to the posterior mid to distal stomach is no longer present. There is a collection along the greater curvature of the stomach which is mildly increased in size from the prior study. Small fluid collections along the left anterior perirenal space are similar to the prior exam. Diffuse mesenteric inflammatory type change is also stable. Minimal free intraperitoneal air is decreased when compared the prior exam. 3. No new palpable intraperitoneal collections. 4. Persistent moderate pleural effusions with associated lower lobe atelectasis similar to the prior exam. Electronically Signed   By: DLajean ManesM.D.   On: 01/31/2016 12:06   Ct Abdomen Pelvis W Contrast  01/25/2016  CLINICAL DATA:  75year old female with followup exploratory laparotomy for bowel perforation. Hartmann's procedure. Continued drainage from abdominal drains. EXAM: CT ABDOMEN AND PELVIS WITH CONTRAST TECHNIQUE: Multidetector CT imaging of  the abdomen and pelvis was performed using the standard protocol following bolus administration of intravenous contrast. CONTRAST:  113m ISOVUE-300 IOPAMIDOL (ISOVUE-300) INJECTION 61% COMPARISON:  01/18/2016 CT FINDINGS: Lower chest: Moderate bilateral pleural effusions and bilateral lower lung atelectasis noted. Mild cardiomegaly and coronary artery calcifications again identified. Hepatobiliary: The liver and gallbladder are unremarkable. No biliary dilatation identified. Pancreas: Unremarkable Spleen: Unremarkable Adrenals/Urinary Tract: Bilateral renal cysts and renal cortical atrophy again noted. The bladder and adrenal glands are unremarkable. Stomach/Bowel: There is no evidence of bowel obstruction. A left abdominal ostomy is noted. Vascular/Lymphatic: Aortic atherosclerotic calcifications noted without aneurysm. No enlarged lymph nodes. Reproductive: Patient is status post hysterectomy. Other: Multiple all intra-abdominal and pelvic abscesses containing fluid and gas are identified and include the following: A 4.8 x 11 x 16.8 cm abscess along the posterior inferior medial liver. A 3.6 x 7.1 cm abscess adjacent to the lateral aspect of the stomach (image 21). A 3.8 x 5.6 cm abscess in the left paramedian mid abdomen (image 37). A 3.3 x 3.5 cm abscess in the region of the lower gastrohepatic ligament (image 28 and 29). A 4 x 11.2 cm abscess within the anterior lower abdomen/ upper pelvis (image 57). This abscess has high density material its left aspect and maybe connected to bowel as this has the appearance of oral contrast. A definite connection is not located. A 3.2 x 3.7 cm posterior pelvic abscess (image 80). Small foci of pneumoperitoneum/ free air noted. Two abdominal drains are present, 1 with tip in the anterior left abdomen and other with tip in the high left abdomen posterior to the spleen. No collections are identified adjacent to the catheter tips. Diffuse subcutaneous edema is noted. A small  right inguinal hernia containing fat is noted. Musculoskeletal: No acute abnormalities identified. IMPRESSION: Multiple intra-abdominal and intrapelvic abscesses as described, with the largest measuring 4.8 x 11 x 16.8 cm along the posterior inferior medial liver. The 4 x 11.2 cm abscess within the anterior lower abdomen/ upper pelvis as high-density material and could represent oral contrast from connection to bowel. Postoperative changes as described with small residual foci of pneumoperitoneum. Moderate bilateral pleural effusions and bilateral lower lung atelectasis. These results were discussed with Dr. BBary Castillaon 01/25/2016 at 11:15 AM. Electronically Signed   By: JMargarette CanadaM.D.   On: 01/25/2016 11:16   Ct Abdomen Pelvis W Contrast  01/18/2016  ADDENDUM REPORT: 01/18/2016 19:52 ADDENDUM: These results were called by telephone at the time of interpretation on 01/18/2016 at 7:51 pm to Dr. CHinda Kehr, who verbally acknowledged these results. Electronically Signed   By: AAnner CreteM.D.   On: 01/18/2016 19:52  01/18/2016  CLINICAL DATA:  75year old female with abdominal pain and diarrhea. No surgery. EXAM: CT ABDOMEN AND PELVIS WITH CONTRAST TECHNIQUE: Multidetector CT imaging of the abdomen and pelvis was performed using the standard protocol following bolus administration of intravenous contrast. CONTRAST:  777mISOVUE-300 IOPAMIDOL (ISOVUE-300) INJECTION 61% COMPARISON:  CT dated 10/09/2007 FINDINGS: Linear bibasilar atelectasis/ scarring. There is large pneumoperitoneum. There is diffuse stranding of the mesentery and omental fat. Small ascites. Mild apparent fatty infiltration of the liver. A 1 cm hypodense lesion in the left lobe of the liver is not well characterized but likely represents a cyst or hemangioma. The gallbladder is mildly distended. No calcified gallstone identified. The pancreas, spleen, adrenal glands appear unremarkable. There bilateral renal parapelvic cysts. Multiple smaller  renal hypodense lesions are not well characterized but likely represent cysts. There is  no hydronephrosis on either side. The visualized ureters and urinary bladder appear unremarkable. Hysterectomy. There is sigmoid diverticulosis with muscular hypertrophy. No active inflammatory changes. There is inflammatory changes and thickening of the body, and antrum of the stomach. Multiple loculated extraluminal air noted in the anterior abdomen and along the greater curvature of the stomach. No extraluminal extravasation of oral contrast identified. There is no evidence of bowel obstruction. The visualized appendix appears unremarkable. There is mild aortoiliac atherosclerotic disease. No portal venous gas identified. There is no adenopathy. There is a small fat containing umbilical hernia. There is mild osteopenia and degenerative changes of the spine. No acute fracture. IMPRESSION: Large amount of pneumoperitoneum most concerning for bowel perforation. The site of presumed bowel perforation is not identified with certainty on the CT. There is thickening of the body and antrum of the stomach. No extraluminal oral contrast identified. Clinical correlation and surgical consult is advised. Sigmoid diverticulosis without active inflammatory changes. Electronically Signed: By: Anner Crete M.D. On: 01/18/2016 19:39   Ct Image Guided Drainage Percut Cath  Peritoneal Retroperit  01/27/2016  INDICATION: Status post exploratory laparotomy for colonic perforation with postoperative fluid collections EXAM: CT GUIDED DRAINAGE OF PERIHEPATIC AND MID ABDOMINAL ABSCESS MEDICATIONS: The patient is currently admitted to the hospital and receiving intravenous antibiotics. The antibiotics were administered within an appropriate time frame prior to the initiation of the procedure. ANESTHESIA/SEDATION: None COMPLICATIONS: None immediate. TECHNIQUE: Informed written consent was obtained from the patient's husband after a thorough  discussion of the procedural risks, benefits and alternatives. All questions were addressed. Maximal Sterile Barrier Technique was utilized including caps, mask, sterile gowns, sterile gloves, sterile drape, hand hygiene and skin antiseptic. A timeout was performed prior to the initiation of the procedure. PROCEDURE: The right mid abdominal wall was prepped with Chlorhexidine in a sterile fashion, and a sterile drape was applied covering the operative field. A sterile gown and sterile gloves were used for the procedure. Local anesthesia was provided with 1% Lidocaine. Utilizing CT guidance, a 16 French pigtail catheter was placed utilizing a quick stick technique into the large anterior mid abdominal collection containing contrast material indicative of communication with the adjacent bowel loops. The catheter was sutured into place and placed to external suction drainage. Utilizing a similar technique, a second 25 French pigtail catheter was placed in the perihepatic fluid collection. This also demonstrates some increased density within consistent with contrast from communication with adjacent bowel loops. The catheter was affixed at the skin surface and placed to external suction drainage. Approximately 550 mL of brownish liquid likely related to bowel contents was aspirated. Samples were sent for laboratory evaluation. The patient tolerated the procedure well and was returned to her room in satisfactory condition. FINDINGS: Both fluid collections demonstrated increased amount of air as well as increasing opacification with orally administered contrast material consistent with communication with the adjacent bowel loops. The fluid obtained from the collections has the appearance of frank bowel contents as opposed to focal pockets of purulent material. IMPRESSION: Successful placement of two 14 French drainage catheters as described above. The collections drained were nearly completely decompressed. Contrast was  noted in both collections which is a slight change from the recent CT examination 0 01/24/13 consistent with communication with adjacent bowel loops. Electronically Signed   By: Inez Catalina M.D.   On: 01/27/2016 15:33    Assessment:   MERRYN THAKER is a 75 y.o. female  With complicated post op course after perforated diverticulitis with  multiple abscesses, dehisced incision and fistula. 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   Recommendations Will continue daptomycin for the VRE - only other alternative would be linezolid but this will suppress bone marrow and she is already anemic Can change zosyn to cipro and flagyl ( the pseudomonas was sensitive to the cipro from prior cultures - will fu on cultures done 4/9) She will likely need prolonged course  Thank you very much for allowing me to participate in the care of this patient. Please call with questions.   Cheral Marker. Ola Spurr, MD

## 2016-02-03 NOTE — Consult Note (Signed)
PARENTERAL NUTRITION CONSULT NOTE - FOLLOW UP   Pharmacy Consult for electrolytes/glucose Indication: TPN  Allergies  Allergen Reactions  . Galantamine Other (See Comments)    Patient Measurements: Height: 5\' 7"  (170.2 cm) Weight: 139 lb 1.8 oz (63.1 kg) IBW/kg (Calculated) : 61.6   Vital Signs: Temp: 97.4 F (36.3 C) (04/11 1951) Temp Source: Oral (04/11 1951) BP: 143/68 mmHg (04/11 1951) Pulse Rate: 110 (04/11 1951) Intake/Output from previous day: 04/10 0701 - 04/11 0700 In: 915 [TPN:915] Out: 2618 [Urine:2550; Drains:68] Intake/Output from this shift: Total I/O In: 0  Out: 400 [Urine:400]  Labs:  Recent Labs  02/01/16 0431 02/03/16 0819  WBC 13.3* 12.0*  HGB 8.3* 7.4*  HCT 25.3* 24.2*  PLT 539* 566*     Recent Labs  02/01/16 0431 02/02/16 0439 02/03/16 0524 02/03/16 1906  NA 135 138 139  --   K 3.7 3.6 3.2* 3.7  CL 109 111 110  --   CO2 22 23 25   --   GLUCOSE 135* 207* 145*  --   BUN 13 17 19   --   CREATININE 0.53 0.53 0.59  --   CALCIUM 7.2* 7.5* 7.6*  --   MG 1.9 1.9 1.9  --   PHOS 3.4  --  3.3  --    Estimated Creatinine Clearance: 60.9 mL/min (by C-G formula based on Cr of 0.59).    Recent Labs  02/03/16 1205 02/03/16 1622 02/03/16 1954  GLUCAP 112* 109* 116*   Insulin Requirements in the past 24 hours:   7 units (SSI q4h)  Current Nutrition:  Clinimix E 5/20 started 01/28/16 now at 60 ml/hr   Assessment: K=3.2 Mag=1.9 Phos= 3.3  Potassium at 1906=3.7   Plan:  No further replacement warranted at this time as potassium is within goal range.  CMP/mag/phos ordered in AM.   Clarisa Schoolsrystal Tawnie Ehresman, PharmD Clinical Pharmacist 02/03/2016

## 2016-02-04 LAB — COMPREHENSIVE METABOLIC PANEL
ALBUMIN: 2.1 g/dL — AB (ref 3.5–5.0)
ALT: 13 U/L — AB (ref 14–54)
AST: 21 U/L (ref 15–41)
Alkaline Phosphatase: 93 U/L (ref 38–126)
Anion gap: 11 (ref 5–15)
BILIRUBIN TOTAL: 0.6 mg/dL (ref 0.3–1.2)
BUN: 17 mg/dL (ref 6–20)
CHLORIDE: 104 mmol/L (ref 101–111)
CO2: 21 mmol/L — ABNORMAL LOW (ref 22–32)
Calcium: 8.4 mg/dL — ABNORMAL LOW (ref 8.9–10.3)
Creatinine, Ser: 0.54 mg/dL (ref 0.44–1.00)
GFR calc Af Amer: >60 mL/min (ref >60)
GFR calc non Af Amer: >60 mL/min (ref >60)
GLUCOSE: 116 mg/dL — AB (ref 65–99)
POTASSIUM: 3.5 mmol/L (ref 3.5–5.1)
Sodium: 136 mmol/L (ref 135–145)
TOTAL PROTEIN: 6.5 g/dL (ref 6.5–8.1)

## 2016-02-04 LAB — CBC
HEMATOCRIT: 34.6 % — AB (ref 35.0–47.0)
HEMOGLOBIN: 11.8 g/dL — AB (ref 12.0–16.0)
MCH: 30.5 pg (ref 26.0–34.0)
MCHC: 34.1 g/dL (ref 32.0–36.0)
MCV: 89.3 fL (ref 80.0–100.0)
Platelets: 657 10*3/uL — ABNORMAL HIGH (ref 150–440)
RBC: 3.87 MIL/uL (ref 3.80–5.20)
RDW: 14.8 % — ABNORMAL HIGH (ref 11.5–14.5)
WBC: 17.9 10*3/uL — AB (ref 3.6–11.0)

## 2016-02-04 LAB — GLUCOSE, CAPILLARY
GLUCOSE-CAPILLARY: 121 mg/dL — AB (ref 65–99)
GLUCOSE-CAPILLARY: 115 mg/dL — AB (ref 65–99)
Glucose-Capillary: 137 mg/dL — ABNORMAL HIGH (ref 65–99)
Glucose-Capillary: 114 mg/dL — ABNORMAL HIGH (ref 65–99)
Glucose-Capillary: 121 mg/dL — ABNORMAL HIGH (ref 65–99)

## 2016-02-04 LAB — CULTURE, ROUTINE-ABSCESS

## 2016-02-04 LAB — MAGNESIUM: MAGNESIUM: 1.7 mg/dL (ref 1.7–2.4)

## 2016-02-04 LAB — PREPARE RBC (CROSSMATCH)

## 2016-02-04 MED ORDER — TRACE MINERALS CR-CU-MN-SE-ZN 10-1000-500-60 MCG/ML IV SOLN
INTRAVENOUS | Status: AC
  Administered 2016-02-05: 19:00:00 via INTRAVENOUS
  Filled 2016-02-04: qty 1440

## 2016-02-04 MED ORDER — DONEPEZIL HCL 5 MG PO TABS
10.0000 mg | ORAL_TABLET | Freq: Every day | ORAL | Status: DC
  Administered 2016-02-05 – 2016-02-13 (×6): 10 mg via ORAL
  Filled 2016-02-04 (×9): qty 2

## 2016-02-04 MED ORDER — TRACE MINERALS CR-CU-MN-SE-ZN 10-1000-500-60 MCG/ML IV SOLN
INTRAVENOUS | Status: AC
  Administered 2016-02-04: 18:00:00 via INTRAVENOUS
  Filled 2016-02-04: qty 1680

## 2016-02-04 MED ORDER — FAT EMULSION 20 % IV EMUL
500.0000 mL | INTRAVENOUS | Status: AC
  Administered 2016-02-05: 500 mL via INTRAVENOUS

## 2016-02-04 NOTE — Care Management (Signed)
It is documented that husband had previously decline ltac, but patient may benefit from reconsideration

## 2016-02-04 NOTE — Progress Notes (Signed)
75 year old female who is postop Day # 17 from [facility] for perforated sigmoid diverticulitis and POD#3 for Ex Lap and drainage of perigastric abscess.  Patient is pleasantly demented siting up in chair.  She stats some soreness in abdomen at this time.  With her dementia unable to answer any other questions.   Filed Vitals:   02/04/16 1142 02/04/16 1342  BP: 140/93 122/70  Pulse: 104 96  Temp: 97.6 F (36.4 C) 98.4 F (36.9 C)  Resp: 18 16   I/O last 3 completed shifts: In: 7001.5 [IV Piggyback:1900] Out: 4363 [Urine:4050; Drains:163; Stool:150] Total I/O In: 2306 [I.V.:991; Blood:719; IV Piggyback:285; TPN:311] Out: 1125 [Urine:1125]   PE:  Gen: NAD Abd: incision clean and dry, no erythema or cellulitis, ostomy, pink and patent, no air or stool in bag, mulitple JP drains, JRight upper and lower some greenish purulent material, left upper serosanginous  Ext: 2+ pulses, no edema  CBC Latest Ref Rng 02/04/2016 02/03/2016 02/01/2016  WBC 3.6 - 11.0 K/uL 17.9(H) 12.0(H) 13.3(H)  Hemoglobin 12.0 - 16.0 g/dL 11.8(L) 7.4(L) 8.3(L)  Hematocrit 35.0 - 47.0 % 34.6(L) 24.2(L) 25.3(L)  Platelets 150 - 440 K/uL 657(H) 566(H) 539(H)   CMP Latest Ref Rng 02/04/2016 02/03/2016 02/03/2016  Glucose 65 - 99 mg/dL 829(F116(H) - 621(H145(H)  BUN 6 - 20 mg/dL 17 - 19  Creatinine 0.860.44 - 1.00 mg/dL 5.780.54 - 4.690.59  Sodium 629135 - 145 mmol/L 136 - 139  Potassium 3.5 - 5.1 mmol/L 3.5 3.7 3.2(L)  Chloride 101 - 111 mmol/L 104 - 110  CO2 22 - 32 mmol/L 21(L) - 25  Calcium 8.9 - 10.3 mg/dL 5.2(W8.4(L) - 7.6(L)  Total Protein 6.5 - 8.1 g/dL 6.5 - -  Total Bilirubin 0.3 - 1.2 mg/dL 0.6 - -  Alkaline Phos 38 - 126 U/L 93 - -  AST 15 - 41 U/L 21 - -  ALT 14 - 54 U/L 13(L) - -    A/P:  75 year old POD#17 and 3 from [facility] and exploratory laparotomy for abscess drainage.  Pain: continue current regimen of when necessary by mouth as well as IV medications. Dementia: aricept and haldol prns Multiple abscess: continue the 3  remaining JP drains until output nothing, she does have psuedomonas and VRE in the drains, she is on cipro, flagyl and dapto for these, appreciate ID help, with WBC increasing concern that maybe we should go back to the Zosyn although rise could be from blood transfusion, will recheck tomorrow AM Malnutrition: continue NPO and TPN, will likely start on some clears later this week but doubt she will be able to take enough in to get off the TPN Dispo: likely will be appropriate for LTAC given her multiple drains, IV antibiotics, new ostomy and wound care, she likely will still be on TPN as well, would like to see her WBC coming down and get her on some po intake before she is transferred

## 2016-02-04 NOTE — Progress Notes (Signed)
Experienced difficulty adding crossmatch to existing blood transfusion order, consulted several charge RNs, supervisor, and lab. Per lab tech, appropriate order has finally been received. Waiting on lab to process blood.

## 2016-02-04 NOTE — Progress Notes (Signed)
MEDICATION RELATED CONSULT NOTE - INITIAL   Pharmacy Consult for Electrolytes  Indication: Potassium, Magnesium   Allergies  Allergen Reactions  . Galantamine Other (See Comments)    Patient Measurements: Height: '5\' 7"'  (170.2 cm) Weight: 137 lb 9.1 oz (62.4 kg) IBW/kg (Calculated) : 61.6 Adjusted Body Weight:   Vital Signs: Temp: 98.4 F (36.9 C) (04/12 1342) Temp Source: Oral (04/12 1342) BP: 122/70 mmHg (04/12 1342) Pulse Rate: 96 (04/12 1342) Intake/Output from previous day: 04/11 0701 - 04/12 0700 In: 6086.5 [IV Piggyback:1900; TPN:4186.5] Out: 3095 [Urine:2850; Drains:95; Stool:150] Intake/Output from this shift: Total I/O In: 2306 [I.V.:991; Blood:719; IV Piggyback:285; TPN:311] Out: 1275 [Urine:1275]  Labs:  Recent Labs  02/02/16 0439 02/03/16 0524 02/03/16 0819 02/04/16 1530  WBC  --   --  12.0* 17.9*  HGB  --   --  7.4* 11.8*  HCT  --   --  24.2* 34.6*  PLT  --   --  566* 657*  CREATININE 0.53 0.59  --  0.54  MG 1.9 1.9  --  1.7  PHOS  --  3.3  --   --   ALBUMIN  --   --   --  2.1*  PROT  --   --   --  6.5  AST  --   --   --  21  ALT  --   --   --  13*  ALKPHOS  --   --   --  93  BILITOT  --   --   --  0.6   Estimated Creatinine Clearance: 60.9 mL/min (by C-G formula based on Cr of 0.54).   Microbiology: Recent Results (from the past 720 hour(s))  MRSA PCR Screening     Status: None   Collection Time: 01/19/16  3:02 AM  Result Value Ref Range Status   MRSA by PCR NEGATIVE NEGATIVE Final    Comment:        The GeneXpert MRSA Assay (FDA approved for NASAL specimens only), is one component of a comprehensive MRSA colonization surveillance program. It is not intended to diagnose MRSA infection nor to guide or monitor treatment for MRSA infections.   Culture, routine-abscess     Status: None   Collection Time: 01/27/16  2:05 PM  Result Value Ref Range Status   Specimen Description ABSCESS  Final   Special Requests Normal  Final   Gram  Stain   Final    MODERATE WBC SEEN MANY GRAM POSITIVE COCCI IN PAIRS MODERATE GRAM NEGATIVE RODS    Culture   Final    HEAVY GROWTH VANCOMYCIN RESISTANT ENTEROCOCCUS MODERATE GROWTH PSEUDOMONAS AERUGINOSA REFER TO OTHER ABSCESS CULTURE FOR SUSCEPTIBILITIES    Report Status 01/31/2016 FINAL  Final   Organism ID, Bacteria PSEUDOMONAS AERUGINOSA  Final      Susceptibility   Pseudomonas aeruginosa - MIC*    CEFTAZIDIME <=1 SENSITIVE Sensitive     CIPROFLOXACIN <=0.25 SENSITIVE Sensitive     GENTAMICIN <=1 SENSITIVE Sensitive     IMIPENEM 1 SENSITIVE Sensitive     CEFEPIME <=1 SENSITIVE Sensitive     * MODERATE GROWTH PSEUDOMONAS AERUGINOSA  Culture, routine-abscess     Status: None   Collection Time: 01/27/16  2:25 PM  Result Value Ref Range Status   Specimen Description ABSCESS  Final   Special Requests Normal  Final   Gram Stain   Final    MODERATE WBC SEEN MANY GRAM POSITIVE COCCI IN PAIRS MANY GRAM NEGATIVE RODS  Culture   Final    HEAVY GROWTH VANCOMYCIN RESISTANT ENTEROCOCCUS MODERATE GROWTH PSEUDOMONAS AERUGINOSA VRE HAVE INTRINSIC RESISTANCE TO MOST COMMONLY USED ANTIBIOTICS AND THE ABILITY TO ACQUIRE RESISTANCE TO MOST AVAILABLE ANTIBIOTICS.    Report Status 01/31/2016 FINAL  Final   Organism ID, Bacteria PSEUDOMONAS AERUGINOSA  Final   Organism ID, Bacteria VANCOMYCIN RESISTANT ENTEROCOCCUS  Final      Susceptibility   Pseudomonas aeruginosa - MIC*    CEFTAZIDIME <=1 SENSITIVE Sensitive     CIPROFLOXACIN <=0.25 SENSITIVE Sensitive     GENTAMICIN <=1 SENSITIVE Sensitive     IMIPENEM 1 SENSITIVE Sensitive     CEFEPIME <=1 SENSITIVE Sensitive     * MODERATE GROWTH PSEUDOMONAS AERUGINOSA   Vancomycin resistant enterococcus - MIC*    AMPICILLIN >=32 RESISTANT Resistant     VANCOMYCIN >=32 RESISTANT Resistant     GENTAMICIN SYNERGY SENSITIVE Sensitive     LINEZOLID Value in next row Sensitive      SENSITIVE2    * HEAVY GROWTH VANCOMYCIN RESISTANT ENTEROCOCCUS  C  difficile quick scan w PCR reflex     Status: None   Collection Time: 02/01/16 10:55 AM  Result Value Ref Range Status   C Diff antigen NEGATIVE NEGATIVE Final   C Diff toxin NEGATIVE NEGATIVE Final   C Diff interpretation Negative for C. difficile  Final  Anaerobic culture     Status: None (Preliminary result)   Collection Time: 02/01/16 11:56 AM  Result Value Ref Range Status   Specimen Description WOUND  Final   Special Requests NONE  Final   Culture NO ANAEROBES ISOLATED  Final   Report Status PENDING  Incomplete  Culture, routine-abscess     Status: None   Collection Time: 02/01/16 11:56 AM  Result Value Ref Range Status   Specimen Description WOUND  Final   Special Requests NONE  Final   Gram Stain MANY WBC SEEN FEW GRAM NEGATIVE RODS   Final   Culture MODERATE GROWTH PSEUDOMONAS AERUGINOSA  Final   Report Status 02/04/2016 FINAL  Final   Organism ID, Bacteria PSEUDOMONAS AERUGINOSA  Final      Susceptibility   Pseudomonas aeruginosa - MIC*    CEFTAZIDIME 4 SENSITIVE Sensitive     CIPROFLOXACIN <=0.25 SENSITIVE Sensitive     GENTAMICIN <=1 SENSITIVE Sensitive     IMIPENEM 1 SENSITIVE Sensitive     CEFEPIME 2 SENSITIVE Sensitive     PIP/TAZO Value in next row Sensitive      SENSITIVE8    AMPICILLIN/SULBACTAM Value in next row Resistant      RESISTANT>=32    * MODERATE GROWTH PSEUDOMONAS AERUGINOSA    Medical History: Past Medical History  Diagnosis Date  . Allergy   . Depression   . Hyperlipidemia   . Anxiety   . Osteoporosis     Medications:  Scheduled:  . sodium chloride   Intravenous Once  . antiseptic oral rinse  7 mL Mouth Rinse QID  . chlorhexidine gluconate (SAGE KIT)  15 mL Mouth Rinse BID  . ciprofloxacin  400 mg Intravenous Q12H  . DAPTOmycin (CUBICIN)  IV  500 mg Intravenous Q24H  . donepezil  10 mg Oral QHS  . enoxaparin (LOVENOX) injection  40 mg Subcutaneous QHS  . fluconazole (DIFLUCAN) IV  200 mg Intravenous Q24H  . insulin aspart  0-9  Units Subcutaneous 6 times per day  . metoprolol tartrate  12.5 mg Oral BID  . metronidazole  500 mg Intravenous Q8H  . pantoprazole (  PROTONIX) IV  40 mg Intravenous Q12H  . sodium chloride flush  10-40 mL Intracatheter Q12H    Assessment:                       4/12 :  All electrolytes WNL   Goal of Therapy:  K:  3.5 - 5.1  Mag : 1.7 - 2.4   Plan:   Will recheck electrolytes on 4/13 with AM labs.   Monzerat Handler D 02/04/2016,6:36 PM

## 2016-02-04 NOTE — Progress Notes (Signed)
Pt NPO. Dr. Tonita CongWoodham notified that pt has PO BP meds. Order given to change to NPO except meds

## 2016-02-04 NOTE — Progress Notes (Signed)
[facility] INFECTIOUS DISEASE PROGRESS NOTE Date of Admission:  01/18/2016     ID: LINDSI BAYLISS is a 75 y.o. female with abd abscesses, pseudomonas and VRE  Active Problems:   Bowel perforation ([place])   Hernia of abdominal cavity   Diverticulitis of colon with perforation   Protein-calorie malnutrition, severe   Abscess of abdominal cavity (HCC)  Subjective: No fevers, changed to cipro flagyl from zosyn 4/11  ROS  Eleven systems are reviewed and negative except per hpi  Medications:  Antibiotics Given (last 72 hours)    Date/Time Action Medication Dose Rate   02/01/16 2111 Given   metroNIDAZOLE (FLAGYL) IVPB 500 mg 500 mg 100 mL/hr   02/01/16 2111 Given   piperacillin-tazobactam (ZOSYN) IVPB 3.375 g 3.375 g 12.5 mL/hr   02/02/16 0354 Given   metroNIDAZOLE (FLAGYL) IVPB 500 mg 500 mg 100 mL/hr   02/02/16 0507 Given   piperacillin-tazobactam (ZOSYN) IVPB 3.375 g 3.375 g 12.5 mL/hr   02/02/16 1149 Given   metroNIDAZOLE (FLAGYL) IVPB 500 mg 500 mg 100 mL/hr   02/02/16 1413 Given   piperacillin-tazobactam (ZOSYN) IVPB 3.375 g 3.375 g 12.5 mL/hr   02/02/16 1800 Given   DAPTOmycin (CUBICIN) 500 mg in sodium chloride 0.9 % IVPB 500 mg 220 mL/hr   02/02/16 2037 Given   metroNIDAZOLE (FLAGYL) IVPB 500 mg 500 mg 100 mL/hr   02/02/16 2300 Given   piperacillin-tazobactam (ZOSYN) IVPB 3.375 g 3.375 g 12.5 mL/hr   02/03/16 0340 Given   metroNIDAZOLE (FLAGYL) IVPB 500 mg 500 mg 100 mL/hr   02/03/16 0502 Given   piperacillin-tazobactam (ZOSYN) IVPB 3.375 g 3.375 g 12.5 mL/hr   02/03/16 1202 Given   metroNIDAZOLE (FLAGYL) IVPB 500 mg 500 mg 100 mL/hr   02/03/16 1511 Given   piperacillin-tazobactam (ZOSYN) IVPB 3.375 g 3.375 g 12.5 mL/hr   02/03/16 1855 Given   DAPTOmycin (CUBICIN) 500 mg in sodium chloride 0.9 % IVPB 500 mg 220 mL/hr   02/03/16 2041 Given   metroNIDAZOLE (FLAGYL) IVPB 500 mg 500 mg 100 mL/hr   02/03/16 2250 Given   ciprofloxacin (CIPRO) IVPB 400 mg 400 mg  200 mL/hr   02/04/16 0522 Given   metroNIDAZOLE (FLAGYL) IVPB 500 mg 500 mg 100 mL/hr   02/04/16 1033 Given   ciprofloxacin (CIPRO) IVPB 400 mg 400 mg 200 mL/hr   02/04/16 1250 Given   metroNIDAZOLE (FLAGYL) IVPB 500 mg 500 mg 100 mL/hr     . sodium chloride   Intravenous Once  . antiseptic oral rinse  7 mL Mouth Rinse QID  . chlorhexidine gluconate (SAGE KIT)  15 mL Mouth Rinse BID  . ciprofloxacin  400 mg Intravenous Q12H  . DAPTOmycin (CUBICIN)  IV  500 mg Intravenous Q24H  . enoxaparin (LOVENOX) injection  40 mg Subcutaneous QHS  . fluconazole (DIFLUCAN) IV  200 mg Intravenous Q24H  . insulin aspart  0-9 Units Subcutaneous 6 times per day  . metoprolol tartrate  12.5 mg Oral BID  . metronidazole  500 mg Intravenous Q8H  . pantoprazole (PROTONIX) IV  40 mg Intravenous Q12H  . sodium chloride flush  10-40 mL Intracatheter Q12H    Objective: Vital signs in last 24 hours: Temp:  [97.4 F (36.3 C)-98.4 F (36.9 C)] 98.4 F (36.9 C) (04/12 1342) Pulse Rate:  [96-116] 96 (04/12 1342) Resp:  [16-24] 16 (04/12 1342) BP: (120-143)/(60-93) 122/70 mmHg (04/12 1342) SpO2:  [95 %-100 %] 98 % (04/12 1342) Weight:  [62.4 kg (137 lb 9.1 oz)]  62.4 kg (137 lb 9.1 oz) (04/12 0500) Constitutional: Frail, dementia HENT: [place]/AT, PERRLA, no scleral icterus Mouth/Throat: Oropharynx is clear and moist. No oropharyngeal exudate.  Cardiovascular: Normal rate, regular rhythm and normal heart sounds. Pulmonary/Chest: Effort normal and breath sounds normal. No respiratory distress. has no wheezes.  Neck = supple, no nuchal rigidity Abdominal: Soft. Incision dehisced is quite clean,  has multple drains, one labeled fistula, has colostomy with soft stools. Lymphadenopathy: no cervical adenopathy. No axillary adenopathy Neurological: dementia Skin: Skin is warm and dry. No rash noted. No erythema.   Lab Results  Recent Labs  02/02/16 0439 02/03/16 0524 02/03/16 0819 02/03/16 1906  WBC  --    --  12.0*  --   HGB  --   --  7.4*  --   HCT  --   --  24.2*  --   NA 138 139  --   --   K 3.6 3.2*  --  3.7  CL 111 110  --   --   CO2 23 25  --   --   BUN 17 19  --   --   CREATININE 0.53 0.59  --   --     Microbiology: Results for orders placed or performed during the hospital encounter of 01/18/16  MRSA PCR Screening     Status: None   Collection Time: 01/19/16  3:02 AM  Result Value Ref Range Status   MRSA by PCR NEGATIVE NEGATIVE Final    Comment:        The GeneXpert MRSA Assay (FDA approved for NASAL specimens only), is one component of a comprehensive MRSA colonization surveillance program. It is not intended to diagnose MRSA infection nor to guide or monitor treatment for MRSA infections.   Culture, routine-abscess     Status: None   Collection Time: 01/27/16  2:05 PM  Result Value Ref Range Status   Specimen Description ABSCESS  Final   Special Requests Normal  Final   Gram Stain   Final    MODERATE WBC SEEN MANY GRAM POSITIVE COCCI IN PAIRS MODERATE GRAM NEGATIVE RODS    Culture   Final    HEAVY GROWTH VANCOMYCIN RESISTANT ENTEROCOCCUS MODERATE GROWTH PSEUDOMONAS AERUGINOSA REFER TO OTHER ABSCESS CULTURE FOR SUSCEPTIBILITIES    Report Status 01/31/2016 FINAL  Final   Organism ID, Bacteria PSEUDOMONAS AERUGINOSA  Final      Susceptibility   Pseudomonas aeruginosa - MIC*    CEFTAZIDIME <=1 SENSITIVE Sensitive     CIPROFLOXACIN <=0.25 SENSITIVE Sensitive     GENTAMICIN <=1 SENSITIVE Sensitive     IMIPENEM 1 SENSITIVE Sensitive     CEFEPIME <=1 SENSITIVE Sensitive     * MODERATE GROWTH PSEUDOMONAS AERUGINOSA  Culture, routine-abscess     Status: None   Collection Time: 01/27/16  2:25 PM  Result Value Ref Range Status   Specimen Description ABSCESS  Final   Special Requests Normal  Final   Gram Stain   Final    MODERATE WBC SEEN MANY GRAM POSITIVE COCCI IN PAIRS MANY GRAM NEGATIVE RODS    Culture   Final    HEAVY GROWTH VANCOMYCIN RESISTANT  ENTEROCOCCUS MODERATE GROWTH PSEUDOMONAS AERUGINOSA VRE HAVE INTRINSIC RESISTANCE TO MOST COMMONLY USED ANTIBIOTICS AND THE ABILITY TO ACQUIRE RESISTANCE TO MOST AVAILABLE ANTIBIOTICS.    Report Status 01/31/2016 FINAL  Final   Organism ID, Bacteria PSEUDOMONAS AERUGINOSA  Final   Organism ID, Bacteria VANCOMYCIN RESISTANT ENTEROCOCCUS  Final      Susceptibility  Pseudomonas aeruginosa - MIC*    CEFTAZIDIME <=1 SENSITIVE Sensitive     CIPROFLOXACIN <=0.25 SENSITIVE Sensitive     GENTAMICIN <=1 SENSITIVE Sensitive     IMIPENEM 1 SENSITIVE Sensitive     CEFEPIME <=1 SENSITIVE Sensitive     * MODERATE GROWTH PSEUDOMONAS AERUGINOSA   Vancomycin resistant enterococcus - MIC*    AMPICILLIN >=32 RESISTANT Resistant     VANCOMYCIN >=32 RESISTANT Resistant     GENTAMICIN SYNERGY SENSITIVE Sensitive     LINEZOLID Value in next row Sensitive      SENSITIVE2    * HEAVY GROWTH VANCOMYCIN RESISTANT ENTEROCOCCUS  C difficile quick scan w PCR reflex     Status: None   Collection Time: 02/01/16 10:55 AM  Result Value Ref Range Status   C Diff antigen NEGATIVE NEGATIVE Final   C Diff toxin NEGATIVE NEGATIVE Final   C Diff interpretation Negative for C. difficile  Final  Anaerobic culture     Status: None (Preliminary result)   Collection Time: 02/01/16 11:56 AM  Result Value Ref Range Status   Specimen Description WOUND  Final   Special Requests NONE  Final   Culture NO ANAEROBES ISOLATED  Final   Report Status PENDING  Incomplete  Culture, routine-abscess     Status: None   Collection Time: 02/01/16 11:56 AM  Result Value Ref Range Status   Specimen Description WOUND  Final   Special Requests NONE  Final   Gram Stain MANY WBC SEEN FEW GRAM NEGATIVE RODS   Final   Culture MODERATE GROWTH PSEUDOMONAS AERUGINOSA  Final   Report Status 02/04/2016 FINAL  Final   Organism ID, Bacteria PSEUDOMONAS AERUGINOSA  Final      Susceptibility   Pseudomonas aeruginosa - MIC*    CEFTAZIDIME 4  SENSITIVE Sensitive     CIPROFLOXACIN <=0.25 SENSITIVE Sensitive     GENTAMICIN <=1 SENSITIVE Sensitive     IMIPENEM 1 SENSITIVE Sensitive     CEFEPIME 2 SENSITIVE Sensitive     PIP/TAZO Value in next row Sensitive      SENSITIVE8    AMPICILLIN/SULBACTAM Value in next row Resistant      RESISTANT>=32    * MODERATE GROWTH PSEUDOMONAS AERUGINOSA    Studies/Results:  Dg Chest 1 View  01/19/2016  CLINICAL DATA:  Endotracheal tube placement.  Initial encounter. EXAM: CHEST 1 VIEW COMPARISON:  None. FINDINGS: The patient's endotracheal tube is seen ending 3-4 cm above the carina. An enteric tube is noted extending below the diaphragm. The lungs are well-aerated. Peribronchial thickening is noted. Minimal bibasilar atelectasis is noted. There is no evidence of pleural effusion or pneumothorax. The cardiomediastinal silhouette is within normal limits. No acute osseous abnormalities are seen. IMPRESSION: 1. Endotracheal tube seen ending 3-4 cm above the carina. 2. Peribronchial thickening noted. Minimal bibasilar atelectasis noted. Electronically Signed   By: Garald Balding M.D.   On: 01/19/2016 01:55   Dg Abd 1 View  02/01/2016  CLINICAL DATA:  Check nasogastric catheter placement EXAM: ABDOMEN - 1 VIEW COMPARISON:  01/31/2016. FINDINGS: Multiple surgical and percutaneous drains are identified similar to that seen on recent exams. No nasogastric catheter is noted at this time. If one has been placed it likely lies in the more proximal esophagus. No other focal abnormality is noted. IMPRESSION: Nasogastric catheter not visualized. Multiple surgical and percutaneous drains are again seen. Electronically Signed   By: Inez Catalina M.D.   On: 02/01/2016 21:04   Ct Abdomen Pelvis W Contrast  01/31/2016  CLINICAL DATA:  Interventional abscess questionable fistula status post drainage. She was doing okay no complication. Gram-positive out from the drain as well as Pseudomonas. Started on vancomycin and Zosyn  EXAM: CT ABDOMEN AND PELVIS WITH CONTRAST TECHNIQUE: Multidetector CT imaging of the abdomen and pelvis was performed using the standard protocol following bolus administration of intravenous contrast. CONTRAST:  116m ISOVUE-300 IOPAMIDOL (ISOVUE-300) INJECTION 61% COMPARISON:  01/25/2016 FINDINGS: Lung bases: Moderate bilateral pleural effusions. Dependent atelectasis mostly in the lower lobes. Heart mildly enlarged. Findings are similar to the prior exam. Peritoneal cavity: There are residual collections. The larger collection noted adjacent to the posterior medial aspect of the liver and right pericolic gutter has been mostly evacuated following percutaneous drainage with a pigtail catheter. Catheter tip lies adjacent to the inferior margin of the liver. There is a small amount of fluid with small bubbles of air along the posterior medial aspect of the liver reflecting a residual collection. Fluid collection along the greater curvature of the stomach has mildly increased in size, now measuring 7.5 x 4.7 x 6.6 cm. Small fluid collections along the left anterior para renal space are similar. Fluid collection seen anterior to the pancreas on the prior study is no longer present. There are no new abdominal or pelvic fluid collections. Inflammatory type stranding throughout the peritoneal fat is similar to the prior study. Hepatobiliary: 8 mm cyst along the anterior margin of the left lobe lateral segment. No other liver masses or lesions. Gallbladder is distended but otherwise unremarkable. No bile duct dilation. Spleen, pancreas, adrenal glands:  Unremarkable. Kidneys, ureters, bladder: There are bilateral renal cortical and renal sinus cysts, stable. No hydronephrosis. Ureters normal in course and in caliber. Bladder is unremarkable. Uterus and adnexa: Uterus surgically absent. Stable right adnexal cyst, likely ovarian, measuring 4.2 cm. Lymph nodes:  No pathologically enlarged lymph nodes. Gastrointestinal:  Inferior sigmoid colon terminates in a short Hartman's pouch. Left colon is diverted into a left mid abdomen colostomy. No evidence of bowel obstruction. No convincing bowel inflammation or ischemia. Minimal free air is seen in the anterior peritoneal cavity of the upper abdomen. This was present on the prior exam and has decreased in amount. Abdominal wall: Persistent soft tissue defect along the anterior abdominal midline with intact fascia. Drainage catheter lies in the pelvis. Under the pigtail catheter lies in the right mid abdomen. Another surgical drains catheter or extends across the upper abdomen below the lower greater curvature of the stomach. IMPRESSION: 1. There has been overall mild improvement when compared to the prior CT. 2. The larger collection noted along the right anterior para renal space and along the posterior medial aspect of the liver has been mostly evacuated following percutaneous drainage. A collections seen adjacent to the posterior mid to distal stomach is no longer present. There is a collection along the greater curvature of the stomach which is mildly increased in size from the prior study. Small fluid collections along the left anterior perirenal space are similar to the prior exam. Diffuse mesenteric inflammatory type change is also stable. Minimal free intraperitoneal air is decreased when compared the prior exam. 3. No new palpable intraperitoneal collections. 4. Persistent moderate pleural effusions with associated lower lobe atelectasis similar to the prior exam. Electronically Signed   By: DLajean ManesM.D.   On: 01/31/2016 12:06   Ct Abdomen Pelvis W Contrast  01/25/2016  CLINICAL DATA:  75year old female with followup exploratory laparotomy for bowel perforation. Hartmann's procedure. Continued drainage from  abdominal drains. EXAM: CT ABDOMEN AND PELVIS WITH CONTRAST TECHNIQUE: Multidetector CT imaging of the abdomen and pelvis was performed using the standard protocol  following bolus administration of intravenous contrast. CONTRAST:  141m ISOVUE-300 IOPAMIDOL (ISOVUE-300) INJECTION 61% COMPARISON:  01/18/2016 CT FINDINGS: Lower chest: Moderate bilateral pleural effusions and bilateral lower lung atelectasis noted. Mild cardiomegaly and coronary artery calcifications again identified. Hepatobiliary: The liver and gallbladder are unremarkable. No biliary dilatation identified. Pancreas: Unremarkable Spleen: Unremarkable Adrenals/Urinary Tract: Bilateral renal cysts and renal cortical atrophy again noted. The bladder and adrenal glands are unremarkable. Stomach/Bowel: There is no evidence of bowel obstruction. A left abdominal ostomy is noted. Vascular/Lymphatic: Aortic atherosclerotic calcifications noted without aneurysm. No enlarged lymph nodes. Reproductive: Patient is status post hysterectomy. Other: Multiple all intra-abdominal and pelvic abscesses containing fluid and gas are identified and include the following: A 4.8 x 11 x 16.8 cm abscess along the posterior inferior medial liver. A 3.6 x 7.1 cm abscess adjacent to the lateral aspect of the stomach (image 21). A 3.8 x 5.6 cm abscess in the left paramedian mid abdomen (image 37). A 3.3 x 3.5 cm abscess in the region of the lower gastrohepatic ligament (image 28 and 29). A 4 x 11.2 cm abscess within the anterior lower abdomen/ upper pelvis (image 57). This abscess has high density material its left aspect and maybe connected to bowel as this has the appearance of oral contrast. A definite connection is not located. A 3.2 x 3.7 cm posterior pelvic abscess (image 80). Small foci of pneumoperitoneum/ free air noted. Two abdominal drains are present, 1 with tip in the anterior left abdomen and other with tip in the high left abdomen posterior to the spleen. No collections are identified adjacent to the catheter tips. Diffuse subcutaneous edema is noted. A small right inguinal hernia containing fat is noted. Musculoskeletal: No  acute abnormalities identified. IMPRESSION: Multiple intra-abdominal and intrapelvic abscesses as described, with the largest measuring 4.8 x 11 x 16.8 cm along the posterior inferior medial liver. The 4 x 11.2 cm abscess within the anterior lower abdomen/ upper pelvis as high-density material and could represent oral contrast from connection to bowel. Postoperative changes as described with small residual foci of pneumoperitoneum. Moderate bilateral pleural effusions and bilateral lower lung atelectasis. These results were discussed with Dr. BBary Castillaon 01/25/2016 at 11:15 AM. Electronically Signed   By: JMargarette CanadaM.D.   On: 01/25/2016 11:16   Ct Abdomen Pelvis W Contrast  01/18/2016  ADDENDUM REPORT: 01/18/2016 19:52 ADDENDUM: These results were called by telephone at the time of interpretation on 01/18/2016 at 7:51 pm to Dr. CHinda Kehr, who verbally acknowledged these results. Electronically Signed   By: AAnner CreteM.D.   On: 01/18/2016 19:52  01/18/2016  CLINICAL DATA:  75year old female with abdominal pain and diarrhea. No surgery. EXAM: CT ABDOMEN AND PELVIS WITH CONTRAST TECHNIQUE: Multidetector CT imaging of the abdomen and pelvis was performed using the standard protocol following bolus administration of intravenous contrast. CONTRAST:  726mISOVUE-300 IOPAMIDOL (ISOVUE-300) INJECTION 61% COMPARISON:  CT dated 10/09/2007 FINDINGS: Linear bibasilar atelectasis/ scarring. There is large pneumoperitoneum. There is diffuse stranding of the mesentery and omental fat. Small ascites. Mild apparent fatty infiltration of the liver. A 1 cm hypodense lesion in the left lobe of the liver is not well characterized but likely represents a cyst or hemangioma. The gallbladder is mildly distended. No calcified gallstone identified. The pancreas, spleen, adrenal glands appear unremarkable. There bilateral renal parapelvic cysts. Multiple  smaller renal hypodense lesions are not well characterized but likely  represent cysts. There is no hydronephrosis on either side. The visualized ureters and urinary bladder appear unremarkable. Hysterectomy. There is sigmoid diverticulosis with muscular hypertrophy. No active inflammatory changes. There is inflammatory changes and thickening of the body, and antrum of the stomach. Multiple loculated extraluminal air noted in the anterior abdomen and along the greater curvature of the stomach. No extraluminal extravasation of oral contrast identified. There is no evidence of bowel obstruction. The visualized appendix appears unremarkable. There is mild aortoiliac atherosclerotic disease. No portal venous gas identified. There is no adenopathy. There is a small fat containing umbilical hernia. There is mild osteopenia and degenerative changes of the spine. No acute fracture. IMPRESSION: Large amount of pneumoperitoneum most concerning for bowel perforation. The site of presumed bowel perforation is not identified with certainty on the CT. There is thickening of the body and antrum of the stomach. No extraluminal oral contrast identified. Clinical correlation and surgical consult is advised. Sigmoid diverticulosis without active inflammatory changes. Electronically Signed: By: Anner Crete M.D. On: 01/18/2016 19:39   Ct Image Guided Drainage Percut Cath  Peritoneal Retroperit  01/27/2016  INDICATION: Status post exploratory laparotomy for colonic perforation with postoperative fluid collections EXAM: CT GUIDED DRAINAGE OF PERIHEPATIC AND MID ABDOMINAL ABSCESS MEDICATIONS: The patient is currently admitted to the hospital and receiving intravenous antibiotics. The antibiotics were administered within an appropriate time frame prior to the initiation of the procedure. ANESTHESIA/SEDATION: None COMPLICATIONS: None immediate. TECHNIQUE: Informed written consent was obtained from the patient's husband after a thorough discussion of the procedural risks, benefits and alternatives. All  questions were addressed. Maximal Sterile Barrier Technique was utilized including caps, mask, sterile gowns, sterile gloves, sterile drape, hand hygiene and skin antiseptic. A timeout was performed prior to the initiation of the procedure. PROCEDURE: The right mid abdominal wall was prepped with Chlorhexidine in a sterile fashion, and a sterile drape was applied covering the operative field. A sterile gown and sterile gloves were used for the procedure. Local anesthesia was provided with 1% Lidocaine. Utilizing CT guidance, a 5 French pigtail catheter was placed utilizing a quick stick technique into the large anterior mid abdominal collection containing contrast material indicative of communication with the adjacent bowel loops. The catheter was sutured into place and placed to external suction drainage. Utilizing a similar technique, a second 26 French pigtail catheter was placed in the perihepatic fluid collection. This also demonstrates some increased density within consistent with contrast from communication with adjacent bowel loops. The catheter was affixed at the skin surface and placed to external suction drainage. Approximately 550 mL of brownish liquid likely related to bowel contents was aspirated. Samples were sent for laboratory evaluation. The patient tolerated the procedure well and was returned to her room in satisfactory condition. FINDINGS: Both fluid collections demonstrated increased amount of air as well as increasing opacification with orally administered contrast material consistent with communication with the adjacent bowel loops. The fluid obtained from the collections has the appearance of frank bowel contents as opposed to focal pockets of purulent material. IMPRESSION: Successful placement of two 14 French drainage catheters as described above. The collections drained were nearly completely decompressed. Contrast was noted in both collections which is a slight change from the recent CT  examination 0 01/24/13 consistent with communication with adjacent bowel loops. Electronically Signed   By: Inez Catalina M.D.   On: 01/27/2016 15:33    Assessment/Plan: Assessment:  [name]  is a 76 y.o. female With complicated post op course after perforated diverticulitis with multiple abscesses, dehisced incision and fistula. 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  FU cx from repeat surgery continues with Pseudomonas sensitive to cipro  Recommendations Will continue daptomycin for the VRE - only other alternative would be linezolid but this will suppress bone marrow and she is already anemic- getting 2 units today) Continue cipro and flagyl ( the pseudomonas was sensitive to the cipro) in place of zosyn (changed 3/11) She will likely need prolonged course with duration based on timing of tube removal etc She has advanced dementia- RN just walked out of room with an empty bag of PRBC after infusion - when I asked pt if she felt better after the transfusion she had no memory of having just received it minutes prior Thank you very much for the consult. Will follow with you.  Rayle, Rashea Hoskie P   02/04/2016, 2:09 PM

## 2016-02-04 NOTE — Care Management (Signed)
Spoke with Dr Orvis BrillLoflin regarding benefits of ltac level of care.  It is very likely that patient will still have drains, TPN, IV antibiotics, wound care at discharge but would like to see patient progress a little more before an actual transfer.  Reassess concept over the next 48 hours

## 2016-02-05 LAB — TYPE AND SCREEN
ABO/RH(D): A POS
Antibody Screen: NEGATIVE
UNIT DIVISION: 0
Unit division: 0

## 2016-02-05 LAB — ANAEROBIC CULTURE

## 2016-02-05 LAB — BASIC METABOLIC PANEL
ANION GAP: 5 (ref 5–15)
BUN: 18 mg/dL (ref 6–20)
CHLORIDE: 106 mmol/L (ref 101–111)
CO2: 25 mmol/L (ref 22–32)
Calcium: 8.2 mg/dL — ABNORMAL LOW (ref 8.9–10.3)
Creatinine, Ser: 0.49 mg/dL (ref 0.44–1.00)
GFR calc non Af Amer: >60 mL/min (ref >60)
GLUCOSE: 116 mg/dL — AB (ref 65–99)
Potassium: 3.1 mmol/L — ABNORMAL LOW (ref 3.5–5.1)
SODIUM: 136 mmol/L (ref 135–145)

## 2016-02-05 LAB — CBC
HEMATOCRIT: 33.4 % — AB (ref 35.0–47.0)
HEMOGLOBIN: 11.1 g/dL — AB (ref 12.0–16.0)
MCH: 30 pg (ref 26.0–34.0)
MCHC: 33.2 g/dL (ref 32.0–36.0)
MCV: 90.5 fL (ref 80.0–100.0)
Platelets: 568 10*3/uL — ABNORMAL HIGH (ref 150–440)
RBC: 3.69 MIL/uL — AB (ref 3.80–5.20)
RDW: 14.6 % — ABNORMAL HIGH (ref 11.5–14.5)
WBC: 14.7 10*3/uL — AB (ref 3.6–11.0)

## 2016-02-05 LAB — PHOSPHORUS: PHOSPHORUS: 3.7 mg/dL (ref 2.5–4.6)

## 2016-02-05 LAB — GLUCOSE, CAPILLARY
GLUCOSE-CAPILLARY: 122 mg/dL — AB (ref 65–99)
GLUCOSE-CAPILLARY: 124 mg/dL — AB (ref 65–99)
GLUCOSE-CAPILLARY: 138 mg/dL — AB (ref 65–99)
Glucose-Capillary: 110 mg/dL — ABNORMAL HIGH (ref 65–99)
Glucose-Capillary: 129 mg/dL — ABNORMAL HIGH (ref 65–99)
Glucose-Capillary: 135 mg/dL — ABNORMAL HIGH (ref 65–99)
Glucose-Capillary: 137 mg/dL — ABNORMAL HIGH (ref 65–99)

## 2016-02-05 LAB — MAGNESIUM: MAGNESIUM: 1.8 mg/dL (ref 1.7–2.4)

## 2016-02-05 MED ORDER — POTASSIUM CHLORIDE 10 MEQ/50ML IV SOLN
10.0000 meq | INTRAVENOUS | Status: AC
  Administered 2016-02-05 (×4): 10 meq via INTRAVENOUS
  Filled 2016-02-05 (×4): qty 50

## 2016-02-05 MED ORDER — MAGNESIUM SULFATE 2 GM/50ML IV SOLN
2.0000 g | Freq: Once | INTRAVENOUS | Status: AC
  Administered 2016-02-05: 2 g via INTRAVENOUS
  Filled 2016-02-05: qty 50

## 2016-02-05 NOTE — Clinical Social Work Note (Signed)
CSW is continuing to follow for discharge planning needs. Pt has one bed offer at present due to medical complexities. CSW will resend SNF referral as pt progresses. CSW will continue to follow.   Dede QuerySarah Sophi Calligan, MSW, LCSW  Clinical Social Worker  [phone]

## 2016-02-05 NOTE — Consult Note (Signed)
PARENTERAL NUTRITION CONSULT NOTE - FOLLOW UP   Pharmacy Consult for electrolytes/glucose Indication: TPN  Allergies  Allergen Reactions  . Galantamine Other (See Comments)    Patient Measurements: Height: 5\' 7"  (170.2 cm) Weight: 132 lb 0.9 oz (59.9 kg) IBW/kg (Calculated) : 61.6   Vital Signs: Temp: 98.6 F (37 C) (04/13 0545) Temp Source: Oral (04/13 0545) BP: 152/63 mmHg (04/13 0545) Pulse Rate: 79 (04/13 0545) Intake/Output from previous day: 04/12 0701 - 04/13 0700 In: 3347 [I.V.:991; Blood:719; IV Piggyback:585; TPN:1052] Out: 3180 [Urine:3050; Drains:30; Stool:100] Intake/Output from this shift: Total I/O In: -  Out: 400 [Urine:400]  Labs:  Recent Labs  02/03/16 0819 02/04/16 1530 02/05/16 1030  WBC 12.0* 17.9* 14.7*  HGB 7.4* 11.8* 11.1*  HCT 24.2* 34.6* 33.4*  PLT 566* 657* 568*     Recent Labs  02/03/16 0524 02/03/16 1906 02/04/16 1530 02/05/16 1030  NA 139  --  136 136  K 3.2* 3.7 3.5 3.1*  CL 110  --  104 106  CO2 25  --  21* 25  GLUCOSE 145*  --  116* 116*  BUN 19  --  17 18  CREATININE 0.59  --  0.54 0.49  CALCIUM 7.6*  --  8.4* 8.2*  MG 1.9  --  1.7 1.8  PHOS 3.3  --   --  3.7  PROT  --   --  6.5  --   ALBUMIN  --   --  2.1*  --   AST  --   --  21  --   ALT  --   --  13*  --   ALKPHOS  --   --  93  --   BILITOT  --   --  0.6  --    Estimated Creatinine Clearance: 59.2 mL/min (by C-G formula based on Cr of 0.49).    Recent Labs  02/05/16 0347 02/05/16 0742 02/05/16 1212  GLUCAP 135* 122* 124*   Insulin Requirements in the past 24 hours:  6 units (SSI q4h)  Current Nutrition:  Clinimix E 5/20 started 01/28/16 now at 60 ml/hr  Assessment: K=3.1 Mag=1.8 Phos= 3.7  Plan:  Will order potassium 10mEq IV x 5 and magnesium 2g IV x 1.   Will obtain follow up electrolytes with am labs.   Pharmacy will continue to monitor and adjust per consult.   Charlies SilversMichael Simpson, PharmD 02/05/2016

## 2016-02-05 NOTE — Progress Notes (Signed)
Notified Dr Tonita CongWoodham that patient had a 6 beat run of V-tach. Patient has scheduled labs in the morning. No new orders at this time

## 2016-02-05 NOTE — Progress Notes (Signed)
Pharmacy Antibiotic Note  Katherine BashRosemarie Z Wood is a 75 y.o. female admitted on 01/18/2016 with abdominal abscess.  Pharmacy has been consulted for daptomycin dosing for VRE. Ciprofloxacin and Flagyl replacing Zosyn for Pseudomonas per ID.   Plan: Continue daptomycin 500mg  IV q24h Will continue to check CK weekly.   Ciprofloxacin 400 mg IV Q12hrs.  Metronidazole 500mg  IV Q8hrs.  Fluconazole 200mg  IV Q24hr.    Height: 5\' 7"  (170.2 cm) Weight: 132 lb 0.9 oz (59.9 kg) IBW/kg (Calculated) : 61.6  Temp (24hrs), Avg:98.4 F (36.9 C), Min:98.2 F (36.8 C), Max:98.6 F (37 C)   Recent Labs Lab 01/31/16 0502  02/01/16 0431 02/02/16 0439 02/03/16 0524 02/03/16 0819 02/04/16 1530 02/05/16 1030  WBC 17.2*  --  13.3*  --   --  12.0* 17.9* 14.7*  CREATININE  --   < > 0.53 0.53 0.59  --  0.54 0.49  < > = values in this interval not displayed.  Estimated Creatinine Clearance: 59.2 mL/min (by C-G formula based on Cr of 0.49).    Allergies  Allergen Reactions  . Galantamine Other (See Comments)    Antimicrobials this admission: Ertapenem  4/2 >> 4/6 Zosyn  4/6 >> 4/11 Flagyl 4/2>> Vancomycin 4/7 >> 4/8 Fluconazole 4/8>> Daptomycin 4/8>> Ciprofloxacin 4/11 >>  Dose adjustments this admission:   Microbiology results: 4/9 Wound Culture: P. Aeruginosa - sensitive to Cipro 4/4 Abd.Abscess CX: P. aeruginosa, VRE 3/27 MRSA PCR: negative  Pharmacy will continue to monitor and adjust per consult.    Dodger Sinning L 02/05/2016 1:13 PM

## 2016-02-05 NOTE — Consult Note (Signed)
WOC ostomy follow up Stoma type/location: LLQ Colostomy. Seen today for pouch change. Stomal assessment/size: 1 and 1/8 inches round, red, moist.  Os at 9 o'clock and at skin level.  Mild undermining of previus pouching system noted today. Peristomal assessment: intact Treatment options for stomal/peristomal skin: skin barrier ring. Suggest changing to convex pouch with next pouching system change, Hart RochesterLawson [phone] Output: pasty green/brown stool Ostomy pouching: 2pc. 2 and 1/4 inch pouching system with skin barrier ring Education provided: none.  Husband is not in room. Enrolled patient in [facility] [employer] Discharge program: Yes WOC nursing team will follow until discharge, and will remain available to this patient, the nursing and medical teams.   Thanks, Ladona MowLaurie Cagney Steenson, MSN, RN, GNP, Hans EdenCWOCN, CWON-AP, FAAN  Pager# [phone]

## 2016-02-05 NOTE — Progress Notes (Signed)
75 year old female who is postop Day # 18 from [facility] for perforated sigmoid diverticulitis and POD#4 for Ex Lap and drainage of perigastric abscess.  Patient has done well today she states that she has some abdominal pain. The husband is in the room as well and states that her pain has been controlled with the pain medication and that she has been up and sitting in the chair for the majority of the day.  Filed Vitals:   02/04/16 2100 02/05/16 0545  BP: 139/64 152/63  Pulse: 92 79  Temp: 98.2 F (36.8 C) 98.6 F (37 C)  Resp: 20 20   I/O last 3 completed shifts: In: 4609.5 [I.V.:991; Blood:719; IV Piggyback:985] Out: 4345 [Urine:4200; Drains:45; Stool:100] Total I/O In: -  Out: 805 [Urine:800; Stool:5]   PE:  Gen: NAD Abd: incision clean and dry, no erythema or cellulitis, ostomy, pink and patent, no air or stool in bag, Right upper and lower JP drains some greenish purulent material, left upper seroous  Ext: 2+ pulses, no edema  CBC Latest Ref Rng 02/05/2016 02/04/2016 02/03/2016  WBC 3.6 - 11.0 K/uL 14.7(H) 17.9(H) 12.0(H)  Hemoglobin 12.0 - 16.0 g/dL 11.1(L) 11.8(L) 7.4(L)  Hematocrit 35.0 - 47.0 % 33.4(L) 34.6(L) 24.2(L)  Platelets 150 - 440 K/uL 568(H) 657(H) 566(H)   CMP Latest Ref Rng 02/05/2016 02/04/2016 02/03/2016  Glucose 65 - 99 mg/dL 161(W116(H) 960(A116(H) -  BUN 6 - 20 mg/dL 18 17 -  Creatinine 5.400.44 - 1.00 mg/dL 9.810.49 1.910.54 -  Sodium 478135 - 145 mmol/L 136 136 -  Potassium 3.5 - 5.1 mmol/L 3.1(L) 3.5 3.7  Chloride 101 - 111 mmol/L 106 104 -  CO2 22 - 32 mmol/L 25 21(L) -  Calcium 8.9 - 10.3 mg/dL 8.2(L) 8.4(L) -  Total Protein 6.5 - 8.1 g/dL - 6.5 -  Total Bilirubin 0.3 - 1.2 mg/dL - 0.6 -  Alkaline Phos 38 - 126 U/L - 93 -  AST 15 - 41 U/L - 21 -  ALT 14 - 54 U/L - 13(L) -    A/P:  75 year old POD#18 and 4 from [facility] and exploratory laparotomy for abscess drainage.  Pain: continue current regimen of when necessary by mouth as well as IV medications. Dementia:  aricept and haldol prns Multiple abscess: continue the 3 remaining JP drains until output nothing, she does have psuedomonas and VRE in the drains, she is on cipro, flagyl and dapto for these, appreciate ID help,  WBC decreasing Malnutrition: continue  TPN, will start her on clears and see if we can advance but likely she will still need TPN for a while due to poor intake  Dispo: likely will be appropriate for LTAC given her multiple drains, IV antibiotics, new ostomy and wound care, she likely will still be on TPN as well, would like to see her WBC coming down and get her on some po intake before she is transferred

## 2016-02-06 LAB — BASIC METABOLIC PANEL
Anion gap: 7 (ref 5–15)
BUN: 17 mg/dL (ref 6–20)
CO2: 24 mmol/L (ref 22–32)
CREATININE: 0.55 mg/dL (ref 0.44–1.00)
Calcium: 8.2 mg/dL — ABNORMAL LOW (ref 8.9–10.3)
Chloride: 107 mmol/L (ref 101–111)
GFR calc Af Amer: >60 mL/min (ref >60)
Glucose, Bld: 143 mg/dL — ABNORMAL HIGH (ref 65–99)
Potassium: 3.5 mmol/L (ref 3.5–5.1)
SODIUM: 138 mmol/L (ref 135–145)

## 2016-02-06 LAB — CBC
HCT: 35.4 % (ref 35.0–47.0)
Hemoglobin: 11.7 g/dL — ABNORMAL LOW (ref 12.0–16.0)
MCH: 30.8 pg (ref 26.0–34.0)
MCHC: 33.2 g/dL (ref 32.0–36.0)
MCV: 92.6 fL (ref 80.0–100.0)
PLATELETS: 609 10*3/uL — AB (ref 150–440)
RBC: 3.82 MIL/uL (ref 3.80–5.20)
RDW: 14.6 % — ABNORMAL HIGH (ref 11.5–14.5)
WBC: 19.2 10*3/uL — ABNORMAL HIGH (ref 3.6–11.0)

## 2016-02-06 LAB — MAGNESIUM: MAGNESIUM: 2.1 mg/dL (ref 1.7–2.4)

## 2016-02-06 LAB — GLUCOSE, CAPILLARY
GLUCOSE-CAPILLARY: 129 mg/dL — AB (ref 65–99)
GLUCOSE-CAPILLARY: 125 mg/dL — AB (ref 65–99)
Glucose-Capillary: 113 mg/dL — ABNORMAL HIGH (ref 65–99)
Glucose-Capillary: 118 mg/dL — ABNORMAL HIGH (ref 65–99)
Glucose-Capillary: 119 mg/dL — ABNORMAL HIGH (ref 65–99)
Glucose-Capillary: 137 mg/dL — ABNORMAL HIGH (ref 65–99)

## 2016-02-06 LAB — PHOSPHORUS: Phosphorus: 3.6 mg/dL (ref 2.5–4.6)

## 2016-02-06 MED ORDER — TRACE MINERALS CR-CU-MN-SE-ZN 10-1000-500-60 MCG/ML IV SOLN
INTRAVENOUS | Status: AC
  Administered 2016-02-06: 18:00:00 via INTRAVENOUS
  Filled 2016-02-06: qty 1440

## 2016-02-06 NOTE — Care Management (Signed)
Patient has been initiated on clear liquid diet.  Patient is still on TPN.   Per Dr. Orvis BrillLoflin may revisit LATCH option again early next week

## 2016-02-06 NOTE — Progress Notes (Signed)
Nutrition Follow-up  DOCUMENTATION CODES:   Severe malnutrition in context of acute illness/injury  INTERVENTION:  -Monitor intake and diet progression -Continue TPN at current goal rate of 3860ml/hr to meet nutritional needs.    NUTRITION DIAGNOSIS:   Inadequate oral intake related to altered GI function as evidenced by NPO status.    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  Pt s/p [facility] procedure 18 days ago and day 5 exploratory lap for drainage of gastric abscess.  Pt now with 3 JP drains  Medications reviewed: Na 138, calcium 8.2, glucose 143, FSBS 129, 118  UOP: 2550ml last 24 hr    Diet Order:  .TPN (CLINIMIX-E) Adult Diet clear liquid Room service appropriate?: Yes; Fluid consistency:: Thin .TPN (CLINIMIX-E) Adult  Skin:  Reviewed, no issues  Last BM:  ostomy output noted on 4/13  Height:   Ht Readings from Last 1 Encounters:  01/19/16 5\' 7"  (1.702 m)    Weight:   Wt Readings from Last 1 Encounters:  02/06/16 127 lb 6.4 oz (57.788 kg)    Ideal Body Weight:     BMI:  Body mass index is 19.95 kg/(m^2).  Estimated Nutritional Needs:   Kcal:  1450-1740 kcals/d  Protein:  (1.0-1.2 g/kg) 58-70 g/d  Fluid:  1450-175740ml/d  EDUCATION NEEDS:   Education needs no appropriate at this time  Aleesha Ringstad B. Freida BusmanAllen, RD, LDN [phone] (pager) Weekend/On-Call pager [phone])

## 2016-02-06 NOTE — Progress Notes (Signed)
75 year old female who is postop Day # 19 from [facility] for perforated sigmoid diverticulitis and POD#5 for Ex Lap and drainage of perigastric abscess.  Patient very confused when I walked in the room with her arms above head looking at abdomen.  She was unsure of where she was or what had happened and was afraid to touch her abdomen.  I explained to her what was going on and she calmed down significantly.  I wrote her a note on a notepad that explained where she was, what has happened, that she was getting better and to press the red button if she had any questions or needed anything.  She expressed understanding and thanks.   Filed Vitals:   02/06/16 0444 02/06/16 1332  BP: 146/77 138/69  Pulse: 108 90  Temp: 98.4 F (36.9 C) 97.5 F (36.4 C)  Resp: 24 18   I/O last 3 completed shifts: In: 3563 [P.O.:340; IV Piggyback:1445] Out: 4505.5 [Urine:4325; Drains:50.5; Stool:130] Total I/O In: -  Out: 1666 [Urine:1600; Drains:41; Stool:25]   PE:  Gen: NAD Abd: incision clean and dry, no erythema or cellulitis, ostomy, pink and patent, no air or stool in bag, Right upper and lower JP drains some greenish purulent material, left upper seroous  Ext: 2+ pulses, no edema  CBC Latest Ref Rng 02/06/2016 02/05/2016 02/04/2016  WBC 3.6 - 11.0 K/uL 19.2(H) 14.7(H) 17.9(H)  Hemoglobin 12.0 - 16.0 g/dL 11.7(L) 11.1(L) 11.8(L)  Hematocrit 35.0 - 47.0 % 35.4 33.4(L) 34.6(L)  Platelets 150 - 440 K/uL 609(H) 568(H) 657(H)   CMP Latest Ref Rng 02/06/2016 02/05/2016 02/04/2016  Glucose 65 - 99 mg/dL 161(W143(H) 960(A116(H) 540(J116(H)  BUN 6 - 20 mg/dL 17 18 17   Creatinine 0.44 - 1.00 mg/dL 8.110.55 9.140.49 7.820.54  Sodium 135 - 145 mmol/L 138 136 136  Potassium 3.5 - 5.1 mmol/L 3.5 3.1(L) 3.5  Chloride 101 - 111 mmol/L 107 106 104  CO2 22 - 32 mmol/L 24 25 21(L)  Calcium 8.9 - 10.3 mg/dL 8.2(L) 8.2(L) 8.4(L)  Total Protein 6.5 - 8.1 g/dL - - 6.5  Total Bilirubin 0.3 - 1.2 mg/dL - - 0.6  Alkaline Phos 38 - 126 U/L - - 93  AST  15 - 41 U/L - - 21  ALT 14 - 54 U/L - - 13(L)    A/P:  75 year old POD#19 and 5 from [facility] and exploratory laparotomy for abscess drainage.  Pain: continue current regimen of when necessary by mouth as well as IV medications. Dementia: aricept and haldol prns, Will get some large notes in room to remind her where she is and what's going on  Multiple abscess: continue the 3 remaining JP drains until output nothing, she does have psuedomonas and VRE in the drains, she is on cipro, flagyl and dapto for these, appreciate ID help,  WBC still high Malnutrition: continue  TPN, starting clear liquids but not much po intake, if gets where she can tolerate food, likely will need duboff with tube feeds to supplement intake Dispo: likely will be appropriate for LTAC given her multiple drains, IV antibiotics, new ostomy and wound care, she likely will have duboff and tube feeds as well, would like to see her WBC coming down and get her on some po intake before she is transferred

## 2016-02-06 NOTE — Progress Notes (Signed)
Pharmacy Antibiotic Note  Katherine BashRosemarie Z Wood is a 75 y.o. female admitted on 01/18/2016 with abdominal abscess.  Pharmacy has been consulted for daptomycin dosing for VRE. Ciprofloxacin and Flagyl replacing Zosyn for Pseudomonas per ID.   Plan: Continue Daptomycin 500mg  IV q24h Will continue to check CK weekly.   Ciprofloxacin 400 mg IV Q12hrs.  Metronidazole 500mg  IV Q8hrs.  Fluconazole 200mg  IV Q24hr.    Height: 5\' 7"  (170.2 cm) Weight: 127 lb 6.4 oz (57.788 kg) IBW/kg (Calculated) : 61.6  Temp (24hrs), Avg:98 F (36.7 C), Min:97.5 F (36.4 C), Max:98.4 F (36.9 C)   Recent Labs Lab 02/01/16 0431 02/02/16 0439 02/03/16 0524 02/03/16 0819 02/04/16 1530 02/05/16 1030 02/06/16 0412  WBC 13.3*  --   --  12.0* 17.9* 14.7* 19.2*  CREATININE 0.53 0.53 0.59  --  0.54 0.49 0.55    Estimated Creatinine Clearance: 57.1 mL/min (by C-G formula based on Cr of 0.55).    Allergies  Allergen Reactions  . Galantamine Other (See Comments)    Antimicrobials this admission: Ertapenem  4/2 >> 4/6 Zosyn  4/6 >> 4/11 Flagyl 4/2>> Vancomycin 4/7 >> 4/8 Fluconazole 4/8>> Daptomycin 4/8>> Ciprofloxacin 4/11 >>  Dose adjustments this admission:   Microbiology results: 4/9 Wound Culture: P. Aeruginosa - sensitive to Cipro 4/4 Abd.Abscess CX: P. aeruginosa, VRE 3/27 MRSA PCR: negative  Pharmacy will continue to monitor and adjust per consult.    Mattox Schorr A 02/06/2016 2:32 PM

## 2016-02-06 NOTE — Consult Note (Signed)
PARENTERAL NUTRITION CONSULT NOTE - FOLLOW UP   Pharmacy Consult for electrolytes/glucose Indication: TPN  Allergies  Allergen Reactions  . Galantamine Other (See Comments)    Patient Measurements: Height: 5\' 7"  (170.2 cm) Weight: 127 lb 6.4 oz (57.788 kg) IBW/kg (Calculated) : 61.6   Vital Signs: Temp: 98.4 F (36.9 C) (04/14 0444) Temp Source: Oral (04/14 0444) BP: 146/77 mmHg (04/14 0444) Pulse Rate: 108 (04/14 0444) Intake/Output from previous day: 04/13 0701 - 04/14 0700 In: 2522 [P.O.:340; IV Piggyback:1145; TPN:1037] Out: 2600.5 [Urine:2550; Drains:20.5; Stool:30] Intake/Output from this shift: Total I/O In: -  Out: 386 [Urine:350; Drains:11; Stool:25]  Labs:  Recent Labs  02/04/16 1530 02/05/16 1030 02/06/16 0412  WBC 17.9* 14.7* 19.2*  HGB 11.8* 11.1* 11.7*  HCT 34.6* 33.4* 35.4  PLT 657* 568* 609*     Recent Labs  02/04/16 1530 02/05/16 1030 02/06/16 0412  NA 136 136 138  K 3.5 3.1* 3.5  CL 104 106 107  CO2 21* 25 24  GLUCOSE 116* 116* 143*  BUN 17 18 17   CREATININE 0.54 0.49 0.55  CALCIUM 8.4* 8.2* 8.2*  MG 1.7 1.8 2.1  PHOS  --  3.7 3.6  PROT 6.5  --   --   ALBUMIN 2.1*  --   --   AST 21  --   --   ALT 13*  --   --   ALKPHOS 93  --   --   BILITOT 0.6  --   --    Estimated Creatinine Clearance: 57.1 mL/min (by C-G formula based on Cr of 0.55).    Recent Labs  02/06/16 02/06/16 0445 02/06/16 0745  GLUCAP 110* 129* 118*   Insulin Requirements in the past 24 hours:  5 units (SSI q4h)  Current Nutrition:  Clinimix E 5/20 started 01/28/16, now at 60 ml/hr Clear liquids 4/13  Assessment: K=3.5 Mag=2.1 Phos= 3.6  Plan:  Will follow up electrolytes with am labs.   Pharmacy will continue to monitor and adjust per consult.   Bari MantisKristin Brookelynn Hamor PharmD Clinical Pharmacist  02/06/2016

## 2016-02-06 NOTE — Care Management Important Message (Signed)
Important Message  Patient Details  Name: Katherine Wood MRN: [id number] Date of Birth: 10-17-1941   Medicare Important Message Given:  Yes    Chapman FitchBOWEN, Navea Woodrow T, RN 02/06/2016, 11:54 AM

## 2016-02-07 LAB — BASIC METABOLIC PANEL
Anion gap: 6 (ref 5–15)
BUN: 18 mg/dL (ref 6–20)
CALCIUM: 8.1 mg/dL — AB (ref 8.9–10.3)
CO2: 26 mmol/L (ref 22–32)
CREATININE: 0.58 mg/dL (ref 0.44–1.00)
Chloride: 104 mmol/L (ref 101–111)
GFR calc non Af Amer: >60 mL/min (ref >60)
Glucose, Bld: 127 mg/dL — ABNORMAL HIGH (ref 65–99)
Potassium: 2.9 mmol/L — CL (ref 3.5–5.1)
SODIUM: 136 mmol/L (ref 135–145)

## 2016-02-07 LAB — GLUCOSE, CAPILLARY
GLUCOSE-CAPILLARY: 140 mg/dL — AB (ref 65–99)
Glucose-Capillary: 148 mg/dL — ABNORMAL HIGH (ref 65–99)
Glucose-Capillary: 133 mg/dL — ABNORMAL HIGH (ref 65–99)
Glucose-Capillary: 111 mg/dL — ABNORMAL HIGH (ref 65–99)
Glucose-Capillary: 190 mg/dL — ABNORMAL HIGH (ref 65–99)

## 2016-02-07 LAB — CK: Total CK: 24 U/L — ABNORMAL LOW (ref 38–234)

## 2016-02-07 LAB — PHOSPHORUS: Phosphorus: 3.4 mg/dL (ref 2.5–4.6)

## 2016-02-07 LAB — MAGNESIUM: MAGNESIUM: 1.9 mg/dL (ref 1.7–2.4)

## 2016-02-07 MED ORDER — POTASSIUM CHLORIDE CRYS ER 20 MEQ PO TBCR
40.0000 meq | EXTENDED_RELEASE_TABLET | Freq: Once | ORAL | Status: AC
  Administered 2016-02-07: 40 meq via ORAL
  Filled 2016-02-07: qty 2

## 2016-02-07 MED ORDER — BOOST / RESOURCE BREEZE PO LIQD
1.0000 | Freq: Three times a day (TID) | ORAL | Status: DC
  Administered 2016-02-07 – 2016-02-08 (×4): 1 via ORAL

## 2016-02-07 MED ORDER — TRACE MINERALS CR-CU-MN-SE-ZN 10-1000-500-60 MCG/ML IV SOLN
INTRAVENOUS | Status: AC
  Administered 2016-02-08: 18:00:00 via INTRAVENOUS
  Filled 2016-02-07: qty 1440

## 2016-02-07 MED ORDER — TRACE MINERALS CR-CU-MN-SE-ZN 10-1000-500-60 MCG/ML IV SOLN
INTRAVENOUS | Status: AC
  Administered 2016-02-07: 19:00:00 via INTRAVENOUS
  Filled 2016-02-07 (×5): qty 1440

## 2016-02-07 MED ORDER — SODIUM CHLORIDE 0.9 % IV SOLN
1.0000 g | INTRAVENOUS | Status: DC
  Administered 2016-02-07 – 2016-02-09 (×3): 1 g via INTRAVENOUS
  Filled 2016-02-07 (×3): qty 1

## 2016-02-07 NOTE — Progress Notes (Signed)
75 year old female who [facility] Day [facility] for perforated sigmoid diverticulitis and POD#6 for Ex Lap and drainage of perigastric abscess. Patient doing well this AM.  Her son and daughter-in-law in room today.  She tolerated clear liquids well but does not have much of an appetite.     Filed Vitals:   02/07/16 0857 02/07/16 1251  BP: 147/78 134/62  Pulse: 120 108  Temp:  98.4 F (36.9 C)  Resp:  20   I/O last 3 completed shifts: In: 2564 [P.O.:320; IV Piggyback:719] Out: 4341 [Urine:4150; Drains:66; Stool:125] Total I/O In: 400 [TPN:400] Out: 532.5 [Urine:400; Drains:7.5; Stool:125]   PE:  Gen: NAD Abd: incision clean and dry, no erythema or cellulitis, ostomy, pink and patent, no air or stool in bag, Right upper and lower JP drains some greenish purulent material, left upper seroous  Ext: 2+ pulses, no edema  CBC Latest Ref Rng 02/06/2016 02/05/2016 02/04/2016  WBC 3.6 - 11.0 K/uL 19.2(H) 14.7(H) 17.9(H)  Hemoglobin 12.0 - 16.0 g/dL 11.7(L) 11.1(L) 11.8(L)  Hematocrit 35.0 - 47.0 % 35.4 33.4(L) 34.6(L)  Platelets 150 - 440 K/uL 609(H) 568(H) 657(H)   CMP Latest Ref Rng 02/07/2016 02/06/2016 02/05/2016  Glucose 65 - 99 mg/dL 409(W127(H) 119(J143(H) 478(G116(H)  BUN 6 - 20 mg/dL 18 17 18   Creatinine 0.44 - 1.00 mg/dL 9.560.58 2.130.55 0.860.49  Sodium 135 - 145 mmol/L 136 138 136  Potassium 3.5 - 5.1 mmol/L 2.9(LL) 3.5 3.1(L)  Chloride 101 - 111 mmol/L 104 107 106  CO2 22 - 32 mmol/L 26 24 25   Calcium 8.9 - 10.3 mg/dL 8.1(L) 8.2(L) 8.2(L)  Total Protein 6.5 - 8.1 g/dL - - -  Total Bilirubin 0.3 - 1.2 mg/dL - - -  Alkaline Phos 38 - 126 U/L - - -  AST 15 - 41 U/L - - -  ALT 14 - 54 U/L - - -    A/P:  75 year old POD#20 and 6 from [facility] and exploratory laparotomy for abscess drainage.  Pain: continue current regimen of when necessary by mouth as well as IV medications. Dementia: aricept and haldol prns, Will get some large notes in room to remind her where she is and what's going on   Multiple abscess: continue the 3 remaining JP drains until output nothing, she does have psuedomonas and VRE in the drains, changed cipro to invanz as still increasing WBC, continue flagyl and dapto for these, appreciate ID help,  WBC still high, will recheck in AM Malnutrition: continue  TPN, starting full liquids and supplements, likely will need duboff with tube feeds to supplement intake Dispo: likely will be appropriate for LTAC given her multiple drains, IV antibiotics, new ostomy and wound care, she likely will have duboff and tube feeds as well, would like to see her WBC coming down and get her on some po intake before she is transferred

## 2016-02-07 NOTE — Progress Notes (Signed)
Lab notified RN of critical lab, K 2.9. Doctor Pyreddy notified, new orders to give Potassium 40 mEq one time dose. Will continue to monitor pt.  Karsten RoLauren E Hobbs

## 2016-02-08 LAB — CBC
HCT: 31.4 % — ABNORMAL LOW (ref 35.0–47.0)
HEMOGLOBIN: 10.6 g/dL — AB (ref 12.0–16.0)
MCH: 30.7 pg (ref 26.0–34.0)
MCHC: 33.7 g/dL (ref 32.0–36.0)
MCV: 91.1 fL (ref 80.0–100.0)
Platelets: 474 10*3/uL — ABNORMAL HIGH (ref 150–440)
RBC: 3.44 MIL/uL — ABNORMAL LOW (ref 3.80–5.20)
RDW: 14.7 % — ABNORMAL HIGH (ref 11.5–14.5)
WBC: 13.3 10*3/uL — ABNORMAL HIGH (ref 3.6–11.0)

## 2016-02-08 LAB — GLUCOSE, CAPILLARY
GLUCOSE-CAPILLARY: 118 mg/dL — AB (ref 65–99)
GLUCOSE-CAPILLARY: 124 mg/dL — AB (ref 65–99)
GLUCOSE-CAPILLARY: 143 mg/dL — AB (ref 65–99)
Glucose-Capillary: 144 mg/dL — ABNORMAL HIGH (ref 65–99)
Glucose-Capillary: 137 mg/dL — ABNORMAL HIGH (ref 65–99)
Glucose-Capillary: 135 mg/dL — ABNORMAL HIGH (ref 65–99)
Glucose-Capillary: 134 mg/dL — ABNORMAL HIGH (ref 65–99)

## 2016-02-08 LAB — BASIC METABOLIC PANEL
Anion gap: 5 (ref 5–15)
BUN: 19 mg/dL (ref 6–20)
CHLORIDE: 106 mmol/L (ref 101–111)
CO2: 25 mmol/L (ref 22–32)
CREATININE: 0.59 mg/dL (ref 0.44–1.00)
Calcium: 7.9 mg/dL — ABNORMAL LOW (ref 8.9–10.3)
GFR calc Af Amer: >60 mL/min (ref >60)
GFR calc non Af Amer: >60 mL/min (ref >60)
GLUCOSE: 144 mg/dL — AB (ref 65–99)
Potassium: 3.5 mmol/L (ref 3.5–5.1)
SODIUM: 136 mmol/L (ref 135–145)

## 2016-02-08 NOTE — Progress Notes (Signed)
75 year old female who [facility] Day [facility] and POD#7 for Ex Lap and drainage of perigastric abscess.Patient sitting up eating today, family in room.  She feels well today, has been up and walking some in the room.    Filed Vitals:   02/08/16 0017 02/08/16 0437  BP: 114/61 134/76  Pulse: 94 114  Temp: 100.1 F (37.8 C) 100 F (37.8 C)  Resp: 18 18   I/O last 3 completed shifts: In: 2817 [IV Piggyback:374] Out: 4275.5 [Urine:4100; Drains:25.5; Stool:150] Total I/O In: 810 [TPN:810] Out: 450 [Urine:450]   PE:  Gen: NAD Abd: incision clean and dry, no erythema or cellulitis, ostomy, pink and patent, no air or stool in bag, Right upper and lower JP drains some greenish purulent material, left upper no output Ext: 2+ pulses, no edema  CBC Latest Ref Rng 02/08/2016 02/06/2016 02/05/2016  WBC 3.6 - 11.0 K/uL 13.3(H) 19.2(H) 14.7(H)  Hemoglobin 12.0 - 16.0 g/dL 10.6(L) 11.7(L) 11.1(L)  Hematocrit 35.0 - 47.0 % 31.4(L) 35.4 33.4(L)  Platelets 150 - 440 K/uL 474(H) 609(H) 568(H)   CMP Latest Ref Rng 02/08/2016 02/07/2016 02/06/2016  Glucose 65 - 99 mg/dL 191(Y144(H) 782(N127(H) 562(Z143(H)  BUN 6 - 20 mg/dL 19 18 17   Creatinine 0.44 - 1.00 mg/dL 3.080.59 6.570.58 8.460.55  Sodium 135 - 145 mmol/L 136 136 138  Potassium 3.5 - 5.1 mmol/L 3.5 2.9(LL) 3.5  Chloride 101 - 111 mmol/L 106 104 107  CO2 22 - 32 mmol/L 25 26 24   Calcium 8.9 - 10.3 mg/dL 7.9(L) 8.1(L) 8.2(L)  Total Protein 6.5 - 8.1 g/dL - - -  Total Bilirubin 0.3 - 1.2 mg/dL - - -  Alkaline Phos 38 - 126 U/L - - -  AST 15 - 41 U/L - - -  ALT 14 - 54 U/L - - -    A/P:  75 year old POD#21 and 7 from [facility] and exploratory laparotomy for abscess drainage.  Pain: continue current regimen of when necessary by mouth as well as IV medications. Dementia: aricept and haldol prns, notes to remind her of situation Multiple abscess: psuedomonas and VRE in the drains, Invanz, flagyl and dapto for these,  appreciate ID help,  WBC trending down, now that on Invanz, can d/c Left JP as no output for two days Malnutrition: continue TPN, full liquids and boost, will get calorie count to decide if needs duboff and tube feeds Dispo: likely for LTAC vs SNF this week, will need to get calorie count and decided when can take off TPN verses needing supplemental tube feedings or if can take enough PO

## 2016-02-09 LAB — BASIC METABOLIC PANEL
ANION GAP: 4 — AB (ref 5–15)
BUN: 18 mg/dL (ref 6–20)
CALCIUM: 7.9 mg/dL — AB (ref 8.9–10.3)
CO2: 27 mmol/L (ref 22–32)
Chloride: 105 mmol/L (ref 101–111)
Creatinine, Ser: 0.53 mg/dL (ref 0.44–1.00)
Glucose, Bld: 149 mg/dL — ABNORMAL HIGH (ref 65–99)
POTASSIUM: 3.5 mmol/L (ref 3.5–5.1)
Sodium: 136 mmol/L (ref 135–145)

## 2016-02-09 LAB — PHOSPHORUS: PHOSPHORUS: 3.5 mg/dL (ref 2.5–4.6)

## 2016-02-09 LAB — GLUCOSE, CAPILLARY
GLUCOSE-CAPILLARY: 139 mg/dL — AB (ref 65–99)
Glucose-Capillary: 113 mg/dL — ABNORMAL HIGH (ref 65–99)
Glucose-Capillary: 116 mg/dL — ABNORMAL HIGH (ref 65–99)
Glucose-Capillary: 117 mg/dL — ABNORMAL HIGH (ref 65–99)
Glucose-Capillary: 133 mg/dL — ABNORMAL HIGH (ref 65–99)

## 2016-02-09 LAB — MAGNESIUM: MAGNESIUM: 1.7 mg/dL (ref 1.7–2.4)

## 2016-02-09 MED ORDER — M.V.I. ADULT IV INJ
INJECTION | INTRAVENOUS | Status: AC
  Administered 2016-02-09: 18:00:00 via INTRAVENOUS
  Filled 2016-02-09: qty 1440

## 2016-02-09 MED ORDER — ENSURE ENLIVE PO LIQD: 237.0000 mL | Freq: Three times a day (TID) | ORAL | Status: DC

## 2016-02-09 MED ORDER — SODIUM CHLORIDE 0.9 % IV SOLN
1.0000 g | Freq: Three times a day (TID) | INTRAVENOUS | Status: DC
  Administered 2016-02-09 – 2016-02-14 (×15): 1 g via INTRAVENOUS
  Filled 2016-02-09 (×18): qty 1

## 2016-02-09 NOTE — Progress Notes (Signed)
Pharmacy Antibiotic Note  Rueben BashRosemarie Z Marines is a 75 y.o. female admitted on 01/18/2016 with abdominal abscess.  Pharmacy has been consulted for daptomycin and meropenem dosing for VRE and pseudomonas.   Plan: Will order meropenem 1gm IV Q8H  Continue Daptomycin 500mg  IV q24h Will continue to check CK weekly.    Height: 5\' 7"  (170.2 cm) Weight: 118 lb 6.4 oz (53.706 kg) IBW/kg (Calculated) : 61.6  Temp (24hrs), Avg:99.3 F (37.4 C), Min:98.2 F (36.8 C), Max:100.2 F (37.9 C)   Recent Labs Lab 02/03/16 0819 02/04/16 1530 02/05/16 1030 02/06/16 0412 02/07/16 0457 02/08/16 0607 02/09/16 0751  WBC 12.0* 17.9* 14.7* 19.2*  --  13.3*  --   CREATININE  --  0.54 0.49 0.55 0.58 0.59 0.53    Estimated Creatinine Clearance: 53.1 mL/min (by C-G formula based on Cr of 0.53).    Allergies  Allergen Reactions  . Galantamine Other (See Comments)    Antimicrobials this admission: Ertapenem  4/2 >> 4/6 Zosyn  4/6 >> 4/11, 4/15>4/17 Flagyl 4/2>>4/17 Vancomycin 4/7 >> 4/8 Fluconazole 4/8>> Daptomycin 4/8>> Ciprofloxacin 4/11 >>4/15 Meropenem 4/17 >>  Microbiology results: 4/9 Wound Culture: P. Aeruginosa - sensitive to Cipro 4/4 Abd.Abscess CX: P. aeruginosa, VRE 3/27 MRSA PCR: negative  Pharmacy will continue to monitor and adjust per consult.    Bernise Sylvain C 02/09/2016 4:49 PM

## 2016-02-09 NOTE — Care Management Important Message (Signed)
Important Message  Patient Details  Name: Rueben BashRosemarie Z Ohagan MRN: [id number] Date of Birth: [birth year]   Medicare Important Message Given:  Yes    Chapman FitchBOWEN, [place] Mielke T, RN 02/09/2016, 10:43 AM

## 2016-02-09 NOTE — Progress Notes (Signed)
Pharmacy Antibiotic Note  Katherine Wood is a 75 y.o. female admitted on 01/18/2016 with abdominal abscess.  Pharmacy has been consulted for daptomycin dosing for VRE. Ciprofloxacin and Flagyl replaced Zosyn for Pseudomonas per ID.   Plan: Continue Daptomycin 500mg  IV q24h Will continue to check CK weekly.   Ciprofloxacin 400 mg IV Q12hrs.  Metronidazole 500mg  IV Q8hrs.  Fluconazole 200mg  IV Q24hr.    Height: 5\' 7"  (170.2 cm) Weight: 118 lb 6.4 oz (53.706 kg) IBW/kg (Calculated) : 61.6  Temp (24hrs), Avg:99.7 F (37.6 C), Min:99.4 F (37.4 C), Max:100.2 F (37.9 C)   Recent Labs Lab 02/03/16 0819 02/04/16 1530 02/05/16 1030 02/06/16 0412 02/07/16 0457 02/08/16 0607 02/09/16 0751  WBC 12.0* 17.9* 14.7* 19.2*  --  13.3*  --   CREATININE  --  0.54 0.49 0.55 0.58 0.59 0.53    Estimated Creatinine Clearance: 53.1 mL/min (by C-G formula based on Cr of 0.53).    Allergies  Allergen Reactions  . Galantamine Other (See Comments)    Antimicrobials this admission: Ertapenem  4/2 >> 4/6 Zosyn  4/6 >> 4/11 Flagyl 4/2>> Vancomycin 4/7 >> 4/8 Fluconazole 4/8>> Daptomycin 4/8>> Ciprofloxacin 4/11 >>  Dose adjustments this admission:   Microbiology results: 4/9 Wound Culture: P. Aeruginosa - sensitive to Cipro 4/4 Abd.Abscess CX: P. aeruginosa, VRE 3/27 MRSA PCR: negative  Pharmacy will continue to monitor and adjust per consult.    Bernarr Longsworth D 02/09/2016 11:33 AM

## 2016-02-09 NOTE — Clinical Social Work Note (Signed)
Patient's MD has given RN CM the clearance for patient to go to LTAC and the RN CM is going to have Sue Lushndrea with [facility] begin prior authorization with insurance. SNF placement will be place on hold at this time. York SpanielMonica Britney Newstrom MSW,LCSW [phone]

## 2016-02-09 NOTE — Progress Notes (Addendum)
Nutrition Follow-up  DOCUMENTATION CODES:   Severe malnutrition in context of acute illness/injury  INTERVENTION:  -Calorie count process reviewed with RN, Irving BurtonEmily.  Would ideally like to calculate calories when pt on solid foods. Based on intake yesterday pt not meeting nutritional needs via oral intake at this time -Recommend switching boost breeze to ensure enlive TID as pt likes this better to provide 350 kcals and 20 gm of protein per container.  -Continue TPN at this time.    NUTRITION DIAGNOSIS:   Inadequate oral intake related to altered GI function as evidenced by NPO status.  Improving as on full liquids  GOAL:   Patient will meet greater than or equal to 90% of their needs  Not meeting nutritional needs  MONITOR:   PO intake, Diet advancement  REASON FOR ASSESSMENT:   LOS, NPO/Clear Liquid Diet    ASSESSMENT:   Pt s/p Hartman's procedure for diverticulitis and purulent perinoitis  Spoke with son at bedside and reports pt ate 100% jello, 4 tsp of creamy soup, 1/4 of Boost Breeze, 1/4 milkshake (Steak and Shake), 1/4 of sweet tea for lunch (total kcals 256 kcals and 5 g of protein. For dinner last night ate 2 tsp of soup, 100% jello, 1/4 of sweet tea (total kcals 93 kcals and 0 g of protein). This am for breakfast RN, Irving Burtonmily reports ate bites of cream of wheat and 1/2 ice cream (106 kcals and 1 g of protein.      Medications and labs reviewed   Diet Order:  .TPN (CLINIMIX-E) Adult Diet full liquid Room service appropriate?: Yes; Fluid consistency:: Thin .TPN (CLINIMIX-E) Adult  Skin:  Reviewed, no issues  Last BM:  ostomy output noted on 4/13  Height:   Ht Readings from Last 1 Encounters:  01/19/16 5\' 7"  (1.702 m)    Weight:   Wt Readings from Last 1 Encounters:  02/09/16 118 lb 6.4 oz (53.706 kg)    Ideal Body Weight:     BMI:  Body mass index is 18.54 kg/(m^2).  Estimated Nutritional Needs:   Kcal:  1450-1740 kcals/d  Protein:  (1.0-1.2  g/kg) 58-70 g/d  Fluid:  1450-17540ml/d  EDUCATION NEEDS:   Education needs no appropriate at this time  Kalyse Meharg B. Freida BusmanAllen, RD, LDN [phone] (pager) Weekend/On-Call pager [phone])

## 2016-02-09 NOTE — Consult Note (Signed)
PARENTERAL NUTRITION CONSULT NOTE - FOLLOW UP   Pharmacy Consult for electrolytes/glucose Indication: TPN  Allergies  Allergen Reactions  . Galantamine Other (See Comments)    Patient Measurements: Height: 5\' 7"  (170.2 cm) Weight: 118 lb 6.4 oz (53.706 kg) IBW/kg (Calculated) : 61.6   Vital Signs: Temp: 100.2 F (37.9 C) (04/17 0416) Temp Source: Oral (04/17 0416) BP: 126/69 mmHg (04/17 0416) Pulse Rate: 121 (04/17 0416) Intake/Output from previous day: 04/16 0701 - 04/17 0700 In: 2336 [P.O.:240; IV Piggyback:250; TPN:1846] Out: 1950 [Urine:1850; Stool:100] Intake/Output from this shift: Total I/O In: 430 [TPN:430] Out: 600 [Urine:600]  Labs:  Recent Labs  02/08/16 0607  WBC 13.3*  HGB 10.6*  HCT 31.4*  PLT 474*     Recent Labs  02/07/16 0457 02/08/16 0607 02/09/16 0751  NA 136 136 136  K 2.9* 3.5 3.5  CL 104 106 105  CO2 26 25 27   GLUCOSE 127* 144* 149*  BUN 18 19 18   CREATININE 0.58 0.59 0.53  CALCIUM 8.1* 7.9* 7.9*  MG 1.9  --  1.7  PHOS 3.4  --  3.5   Estimated Creatinine Clearance: 53.1 mL/min (by C-G formula based on Cr of 0.53).    Recent Labs  02/08/16 2044 02/08/16 2349 02/09/16 0753  GLUCAP 134* 143* 139*   Insulin Requirements in the past 24 hours:  5 units (SSI q4h)  Current Nutrition:  Clinimix E 5/20 started 01/28/16, now at 60 ml/hr Clear liquids 4/13  Assessment: All electrolytes wnl.   Plan:  Will follow up electrolytes with am labs.   Pharmacy will continue to monitor and adjust per consult.   Demetrius Charityeldrin D. Meagan Ancona, PharmD   02/09/2016

## 2016-02-09 NOTE — Progress Notes (Signed)
8 Days Post-Op   Subjective: 75 year old female who [facility] sigmoid divert[facility] and POD#8 for Ex Lap and drainage of perigastric abscess. Patient sitting up eating today. She feels well today, has been up and walking some in the room with assistance  Vital signs in last 24 hours: Temp:  [98.2 F (36.8 C)-100.2 F (37.9 C)] 98.2 F (36.8 C) (04/17 1346) Pulse Rate:  [115-125] 115 (04/17 1346) Resp:  [18-19] 19 (04/17 1346) BP: (126-130)/(69-78) 129/70 mmHg (04/17 1346) SpO2:  [96 %-98 %] 97 % (04/17 1346) Weight:  [53.706 kg (118 lb 6.4 oz)] 53.706 kg (118 lb 6.4 oz) (04/17 0431) Last BM Date: 02/09/16  Intake/Output from previous day: 04/16 0701 - 04/17 0700 In: 2336 [P.O.:240; IV Piggyback:250; TPN:1846] Out: 1950 [Urine:1850; Stool:100]  PE: GEN NAD GI: Abdomen soft, appropriately tender to palpation at incision sites. Incision is clean and dry with dressing over the top without evidence of erythema or drainage. Ostomy is present left upper quadrant that is functional with air and stool in the bag. Right-sided JP drains in place with scant greenish fluid  Lab Results:  CBC  Recent Labs  02/08/16 0607  WBC 13.3*  HGB 10.6*  HCT 31.4*  PLT 474*   CMP     Component Value Date/Time   NA 136 02/09/2016 0751   NA 144 09/11/2013   K 3.5 02/09/2016 0751   CL 105 02/09/2016 0751   CO2 27 02/09/2016 0751   GLUCOSE 149* 02/09/2016 0751   BUN 18 02/09/2016 0751   BUN 22* 09/11/2013   CREATININE 0.53 02/09/2016 0751   CALCIUM 7.9* 02/09/2016 0751   PROT 6.5 02/04/2016 1530   ALBUMIN 2.1* 02/04/2016 1530   AST 21 02/04/2016 1530   ALT 13* 02/04/2016 1530   ALKPHOS 93 02/04/2016 1530   BILITOT 0.6 02/04/2016 1530   GFRNONAA >60 02/09/2016 0751   GFRAA >60 02/09/2016 0751   PT/INR No results for input(s): LABPROT, INR in the last 72 hours.  Studies/Results: No results found.  Assessment/Plan: 75 year old female status post  export her laparotomy for perforated sigmoid diverticulitis with multiple other comorbidities. Her postoperative course has been complicated by numerous abdominal infections growing Pseudomonas and VRE. Appreciate infectious disease and pharmacy assistance with this patient. Patient currently on TPN in undergoing a calorie count to determine if TPN can be weaned. Drains were placed in the right side continue to drain purulent appearing fluid. Patient appears to continue to be improving and is likely ready for an LTAC this week. She will need TPN until she tolerates enough oral nutrition to allow for healing. She will need to continue on antibiotics until her white blood cell count is normal and then for approximately 1 week after given the infection she had. Continue twice daily dressing changes to her midline wound. Encourage ambulation, incentive spirometer, oral intake.   Ricarda Frameharles Kyelle Urbas, MD FACS General Surgeon  02/09/2016

## 2016-02-09 NOTE — Progress Notes (Signed)
[facility] INFECTIOUS DISEASE PROGRESS NOTE Date of Admission:  01/18/2016     ID: Katherine Wood is a 75 y.o. female with abd abscesses, pseudomonas and VRE  Active Problems:   Bowel perforation ([place])   Hernia of abdominal cavity   Diverticulitis of colon with perforation   Protein-calorie malnutrition, severe   Abscess of abdominal cavity (HCC)  Subjective: cipro changed to invanz on 4/15 due to increasing wbc and fevers.  ,  Wbc trending down now and low grade fevers improving Husband at bedside  ROS  Eleven systems are reviewed and negative except per hpi  Medications:  Antibiotics Given (last 72 hours)    Date/Time Action Medication Dose Rate   02/06/16 1720 Given   DAPTOmycin (CUBICIN) 500 mg in sodium chloride 0.9 % IVPB 500 mg 220 mL/hr   02/06/16 1959 Given   metroNIDAZOLE (FLAGYL) IVPB 500 mg 500 mg 100 mL/hr   02/06/16 2147 Given   ciprofloxacin (CIPRO) IVPB 400 mg 400 mg 200 mL/hr   02/07/16 0419 Given   metroNIDAZOLE (FLAGYL) IVPB 500 mg 500 mg 100 mL/hr   02/07/16 1045 Given   ertapenem (INVANZ) 1 g in sodium chloride 0.9 % 50 mL IVPB 1 g 100 mL/hr   02/07/16 1247 Given   metroNIDAZOLE (FLAGYL) IVPB 500 mg 500 mg 100 mL/hr   02/07/16 1732 Given   DAPTOmycin (CUBICIN) 500 mg in sodium chloride 0.9 % IVPB 500 mg 220 mL/hr   02/07/16 2245 Given   metroNIDAZOLE (FLAGYL) IVPB 500 mg 500 mg 100 mL/hr   02/08/16 0500 Given   metroNIDAZOLE (FLAGYL) IVPB 500 mg 500 mg 100 mL/hr   02/08/16 1148 Given   metroNIDAZOLE (FLAGYL) IVPB 500 mg 500 mg 100 mL/hr   02/08/16 1148 Given   ertapenem (INVANZ) 1 g in sodium chloride 0.9 % 50 mL IVPB 1 g 100 mL/hr   02/08/16 1737 Given   DAPTOmycin (CUBICIN) 500 mg in sodium chloride 0.9 % IVPB 500 mg 220 mL/hr   02/08/16 2235 Given   metroNIDAZOLE (FLAGYL) IVPB 500 mg 500 mg 100 mL/hr   02/09/16 0433 Given   metroNIDAZOLE (FLAGYL) IVPB 500 mg 500 mg 100 mL/hr   02/09/16 0836 Given   ertapenem (INVANZ) 1 g in sodium  chloride 0.9 % 50 mL IVPB 1 g 100 mL/hr   02/09/16 1151 Given   metroNIDAZOLE (FLAGYL) IVPB 500 mg 500 mg 100 mL/hr     . sodium chloride   Intravenous Once  . antiseptic oral rinse  7 mL Mouth Rinse QID  . chlorhexidine gluconate (SAGE KIT)  15 mL Mouth Rinse BID  . DAPTOmycin (CUBICIN)  IV  500 mg Intravenous Q24H  . donepezil  10 mg Oral QHS  . enoxaparin (LOVENOX) injection  40 mg Subcutaneous QHS  . ertapenem  1 g Intravenous Q24H  . feeding supplement (ENSURE ENLIVE)  237 mL Oral TID  . fluconazole (DIFLUCAN) IV  200 mg Intravenous Q24H  . insulin aspart  0-9 Units Subcutaneous 6 times per day  . metoprolol tartrate  12.5 mg Oral BID  . metronidazole  500 mg Intravenous Q8H  . pantoprazole (PROTONIX) IV  40 mg Intravenous Q12H  . sodium chloride flush  10-40 mL Intracatheter Q12H    Objective: Vital signs in last 24 hours: Temp:  [98.2 F (36.8 C)-100.2 F (37.9 C)] 98.2 F (36.8 C) (04/17 1346) Pulse Rate:  [115-125] 115 (04/17 1346) Resp:  [18-19] 19 (04/17 1346) BP: (126-130)/(69-78) 129/70 mmHg (04/17 1346) SpO2:  [  96 %-98 %] 97 % (04/17 1346) Weight:  [53.706 kg (118 lb 6.4 oz)] 53.706 kg (118 lb 6.4 oz) (04/17 0431) Constitutional: Frail, dementia HENT: [place]/AT, PERRLA, no scleral icterus Mouth/Throat: Oropharynx is clear and moist. No oropharyngeal exudate.  Cardiovascular: Normal rate, regular rhythm and normal heart sounds. Pulmonary/Chest: Effort normal and breath sounds normal. No respiratory distress. has no wheezes.  Neck = supple, no nuchal rigidity Abdominal: Soft. Incision dehisced is quite clean,  has multple drains, one labeled fistula, has colostomy with soft stools. Lymphadenopathy: no cervical adenopathy. No axillary adenopathy Neurological: dementia Skin: Skin is warm and dry. No rash noted. No erythema.   Lab Results  Recent Labs  02/08/16 0607 02/09/16 0751  WBC 13.3*  --   HGB 10.6*  --   HCT 31.4*  --   NA 136 136  K 3.5 3.5  CL  106 105  CO2 25 27  BUN 19 18  CREATININE 0.59 0.53    Microbiology: Results for orders placed or performed during the hospital encounter of 01/18/16  MRSA PCR Screening     Status: None   Collection Time: 01/19/16  3:02 AM  Result Value Ref Range Status   MRSA by PCR NEGATIVE NEGATIVE Final    Comment:        The GeneXpert MRSA Assay (FDA approved for NASAL specimens only), is one component of a comprehensive MRSA colonization surveillance program. It is not intended to diagnose MRSA infection nor to guide or monitor treatment for MRSA infections.   Culture, routine-abscess     Status: None   Collection Time: 01/27/16  2:05 PM  Result Value Ref Range Status   Specimen Description ABSCESS  Final   Special Requests Normal  Final   Gram Stain   Final    MODERATE WBC SEEN MANY GRAM POSITIVE COCCI IN PAIRS MODERATE GRAM NEGATIVE RODS    Culture   Final    HEAVY GROWTH VANCOMYCIN RESISTANT ENTEROCOCCUS MODERATE GROWTH PSEUDOMONAS AERUGINOSA REFER TO OTHER ABSCESS CULTURE FOR SUSCEPTIBILITIES    Report Status 01/31/2016 FINAL  Final   Organism ID, Bacteria PSEUDOMONAS AERUGINOSA  Final      Susceptibility   Pseudomonas aeruginosa - MIC*    CEFTAZIDIME <=1 SENSITIVE Sensitive     CIPROFLOXACIN <=0.25 SENSITIVE Sensitive     GENTAMICIN <=1 SENSITIVE Sensitive     IMIPENEM 1 SENSITIVE Sensitive     CEFEPIME <=1 SENSITIVE Sensitive     * MODERATE GROWTH PSEUDOMONAS AERUGINOSA  Culture, routine-abscess     Status: None   Collection Time: 01/27/16  2:25 PM  Result Value Ref Range Status   Specimen Description ABSCESS  Final   Special Requests Normal  Final   Gram Stain   Final    MODERATE WBC SEEN MANY GRAM POSITIVE COCCI IN PAIRS MANY GRAM NEGATIVE RODS    Culture   Final    HEAVY GROWTH VANCOMYCIN RESISTANT ENTEROCOCCUS MODERATE GROWTH PSEUDOMONAS AERUGINOSA VRE HAVE INTRINSIC RESISTANCE TO MOST COMMONLY USED ANTIBIOTICS AND THE ABILITY TO ACQUIRE RESISTANCE TO MOST  AVAILABLE ANTIBIOTICS.    Report Status 01/31/2016 FINAL  Final   Organism ID, Bacteria PSEUDOMONAS AERUGINOSA  Final   Organism ID, Bacteria VANCOMYCIN RESISTANT ENTEROCOCCUS  Final      Susceptibility   Pseudomonas aeruginosa - MIC*    CEFTAZIDIME <=1 SENSITIVE Sensitive     CIPROFLOXACIN <=0.25 SENSITIVE Sensitive     GENTAMICIN <=1 SENSITIVE Sensitive     IMIPENEM 1 SENSITIVE Sensitive  CEFEPIME <=1 SENSITIVE Sensitive     * MODERATE GROWTH PSEUDOMONAS AERUGINOSA   Vancomycin resistant enterococcus - MIC*    AMPICILLIN >=32 RESISTANT Resistant     VANCOMYCIN >=32 RESISTANT Resistant     GENTAMICIN SYNERGY SENSITIVE Sensitive     LINEZOLID Value in next row Sensitive      SENSITIVE2    * HEAVY GROWTH VANCOMYCIN RESISTANT ENTEROCOCCUS  C difficile quick scan w PCR reflex     Status: None   Collection Time: 02/01/16 10:55 AM  Result Value Ref Range Status   C Diff antigen NEGATIVE NEGATIVE Final   C Diff toxin NEGATIVE NEGATIVE Final   C Diff interpretation Negative for C. difficile  Final  Anaerobic culture     Status: None   Collection Time: 02/01/16 11:56 AM  Result Value Ref Range Status   Specimen Description WOUND  Final   Special Requests NONE  Final   Culture NO ANAEROBES ISOLATED  Final   Report Status 02/05/2016 FINAL  Final  Culture, routine-abscess     Status: None   Collection Time: 02/01/16 11:56 AM  Result Value Ref Range Status   Specimen Description WOUND  Final   Special Requests NONE  Final   Gram Stain MANY WBC SEEN FEW GRAM NEGATIVE RODS   Final   Culture MODERATE GROWTH PSEUDOMONAS AERUGINOSA  Final   Report Status 02/04/2016 FINAL  Final   Organism ID, Bacteria PSEUDOMONAS AERUGINOSA  Final      Susceptibility   Pseudomonas aeruginosa - MIC*    CEFTAZIDIME 4 SENSITIVE Sensitive     CIPROFLOXACIN <=0.25 SENSITIVE Sensitive     GENTAMICIN <=1 SENSITIVE Sensitive     IMIPENEM 1 SENSITIVE Sensitive     CEFEPIME 2 SENSITIVE Sensitive      PIP/TAZO Value in next row Sensitive      SENSITIVE8    AMPICILLIN/SULBACTAM Value in next row Resistant      RESISTANT>=32    * MODERATE GROWTH PSEUDOMONAS AERUGINOSA    Studies/Results:  Dg Chest 1 View  01/19/2016  CLINICAL DATA:  Endotracheal tube placement.  Initial encounter. EXAM: CHEST 1 VIEW COMPARISON:  None. FINDINGS: The patient's endotracheal tube is seen ending 3-4 cm above the carina. An enteric tube is noted extending below the diaphragm. The lungs are well-aerated. Peribronchial thickening is noted. Minimal bibasilar atelectasis is noted. There is no evidence of pleural effusion or pneumothorax. The cardiomediastinal silhouette is within normal limits. No acute osseous abnormalities are seen. IMPRESSION: 1. Endotracheal tube seen ending 3-4 cm above the carina. 2. Peribronchial thickening noted. Minimal bibasilar atelectasis noted. Electronically Signed   By: Garald Balding M.D.   On: 01/19/2016 01:55   Dg Abd 1 View  02/01/2016  CLINICAL DATA:  Check nasogastric catheter placement EXAM: ABDOMEN - 1 VIEW COMPARISON:  01/31/2016. FINDINGS: Multiple surgical and percutaneous drains are identified similar to that seen on recent exams. No nasogastric catheter is noted at this time. If one has been placed it likely lies in the more proximal esophagus. No other focal abnormality is noted. IMPRESSION: Nasogastric catheter not visualized. Multiple surgical and percutaneous drains are again seen. Electronically Signed   By: Inez Catalina M.D.   On: 02/01/2016 21:04   Ct Abdomen Pelvis W Contrast  01/31/2016  CLINICAL DATA:  Interventional abscess questionable fistula status post drainage. She was doing okay no complication. Gram-positive out from the drain as well as Pseudomonas. Started on vancomycin and Zosyn EXAM: CT ABDOMEN AND PELVIS WITH CONTRAST TECHNIQUE:  Multidetector CT imaging of the abdomen and pelvis was performed using the standard protocol following bolus administration of  intravenous contrast. CONTRAST:  152m ISOVUE-300 IOPAMIDOL (ISOVUE-300) INJECTION 61% COMPARISON:  01/25/2016 FINDINGS: Lung bases: Moderate bilateral pleural effusions. Dependent atelectasis mostly in the lower lobes. Heart mildly enlarged. Findings are similar to the prior exam. Peritoneal cavity: There are residual collections. The larger collection noted adjacent to the posterior medial aspect of the liver and right pericolic gutter has been mostly evacuated following percutaneous drainage with a pigtail catheter. Catheter tip lies adjacent to the inferior margin of the liver. There is a small amount of fluid with small bubbles of air along the posterior medial aspect of the liver reflecting a residual collection. Fluid collection along the greater curvature of the stomach has mildly increased in size, now measuring 7.5 x 4.7 x 6.6 cm. Small fluid collections along the left anterior para renal space are similar. Fluid collection seen anterior to the pancreas on the prior study is no longer present. There are no new abdominal or pelvic fluid collections. Inflammatory type stranding throughout the peritoneal fat is similar to the prior study. Hepatobiliary: 8 mm cyst along the anterior margin of the left lobe lateral segment. No other liver masses or lesions. Gallbladder is distended but otherwise unremarkable. No bile duct dilation. Spleen, pancreas, adrenal glands:  Unremarkable. Kidneys, ureters, bladder: There are bilateral renal cortical and renal sinus cysts, stable. No hydronephrosis. Ureters normal in course and in caliber. Bladder is unremarkable. Uterus and adnexa: Uterus surgically absent. Stable right adnexal cyst, likely ovarian, measuring 4.2 cm. Lymph nodes:  No pathologically enlarged lymph nodes. Gastrointestinal: Inferior sigmoid colon terminates in a short Hartman's pouch. Left colon is diverted into a left mid abdomen colostomy. No evidence of bowel obstruction. No convincing bowel  inflammation or ischemia. Minimal free air is seen in the anterior peritoneal cavity of the upper abdomen. This was present on the prior exam and has decreased in amount. Abdominal wall: Persistent soft tissue defect along the anterior abdominal midline with intact fascia. Drainage catheter lies in the pelvis. Under the pigtail catheter lies in the right mid abdomen. Another surgical drains catheter or extends across the upper abdomen below the lower greater curvature of the stomach. IMPRESSION: 1. There has been overall mild improvement when compared to the prior CT. 2. The larger collection noted along the right anterior para renal space and along the posterior medial aspect of the liver has been mostly evacuated following percutaneous drainage. A collections seen adjacent to the posterior mid to distal stomach is no longer present. There is a collection along the greater curvature of the stomach which is mildly increased in size from the prior study. Small fluid collections along the left anterior perirenal space are similar to the prior exam. Diffuse mesenteric inflammatory type change is also stable. Minimal free intraperitoneal air is decreased when compared the prior exam. 3. No new palpable intraperitoneal collections. 4. Persistent moderate pleural effusions with associated lower lobe atelectasis similar to the prior exam. Electronically Signed   By: DLajean ManesM.D.   On: 01/31/2016 12:06   Ct Abdomen Pelvis W Contrast  01/25/2016  CLINICAL DATA:  75year old female with followup exploratory laparotomy for bowel perforation. Hartmann's procedure. Continued drainage from abdominal drains. EXAM: CT ABDOMEN AND PELVIS WITH CONTRAST TECHNIQUE: Multidetector CT imaging of the abdomen and pelvis was performed using the standard protocol following bolus administration of intravenous contrast. CONTRAST:  1035mISOVUE-300 IOPAMIDOL (ISOVUE-300) INJECTION 61% COMPARISON:  01/18/2016 CT FINDINGS: Lower chest:  Moderate bilateral pleural effusions and bilateral lower lung atelectasis noted. Mild cardiomegaly and coronary artery calcifications again identified. Hepatobiliary: The liver and gallbladder are unremarkable. No biliary dilatation identified. Pancreas: Unremarkable Spleen: Unremarkable Adrenals/Urinary Tract: Bilateral renal cysts and renal cortical atrophy again noted. The bladder and adrenal glands are unremarkable. Stomach/Bowel: There is no evidence of bowel obstruction. A left abdominal ostomy is noted. Vascular/Lymphatic: Aortic atherosclerotic calcifications noted without aneurysm. No enlarged lymph nodes. Reproductive: Patient is status post hysterectomy. Other: Multiple all intra-abdominal and pelvic abscesses containing fluid and gas are identified and include the following: A 4.8 x 11 x 16.8 cm abscess along the posterior inferior medial liver. A 3.6 x 7.1 cm abscess adjacent to the lateral aspect of the stomach (image 21). A 3.8 x 5.6 cm abscess in the left paramedian mid abdomen (image 37). A 3.3 x 3.5 cm abscess in the region of the lower gastrohepatic ligament (image 28 and 29). A 4 x 11.2 cm abscess within the anterior lower abdomen/ upper pelvis (image 57). This abscess has high density material its left aspect and maybe connected to bowel as this has the appearance of oral contrast. A definite connection is not located. A 3.2 x 3.7 cm posterior pelvic abscess (image 80). Small foci of pneumoperitoneum/ free air noted. Two abdominal drains are present, 1 with tip in the anterior left abdomen and other with tip in the high left abdomen posterior to the spleen. No collections are identified adjacent to the catheter tips. Diffuse subcutaneous edema is noted. A small right inguinal hernia containing fat is noted. Musculoskeletal: No acute abnormalities identified. IMPRESSION: Multiple intra-abdominal and intrapelvic abscesses as described, with the largest measuring 4.8 x 11 x 16.8 cm along the  posterior inferior medial liver. The 4 x 11.2 cm abscess within the anterior lower abdomen/ upper pelvis as high-density material and could represent oral contrast from connection to bowel. Postoperative changes as described with small residual foci of pneumoperitoneum. Moderate bilateral pleural effusions and bilateral lower lung atelectasis. These results were discussed with Dr. Bary Castilla on 01/25/2016 at 11:15 AM. Electronically Signed   By: Margarette Canada M.D.   On: 01/25/2016 11:16   Ct Abdomen Pelvis W Contrast  01/18/2016  ADDENDUM REPORT: 01/18/2016 19:52 ADDENDUM: These results were called by telephone at the time of interpretation on 01/18/2016 at 7:51 pm to Dr. Hinda Kehr , who verbally acknowledged these results. Electronically Signed   By: Anner Crete M.D.   On: 01/18/2016 19:52  01/18/2016  CLINICAL DATA:  75 year old female with abdominal pain and diarrhea. No surgery. EXAM: CT ABDOMEN AND PELVIS WITH CONTRAST TECHNIQUE: Multidetector CT imaging of the abdomen and pelvis was performed using the standard protocol following bolus administration of intravenous contrast. CONTRAST:  62m ISOVUE-300 IOPAMIDOL (ISOVUE-300) INJECTION 61% COMPARISON:  CT dated 10/09/2007 FINDINGS: Linear bibasilar atelectasis/ scarring. There is large pneumoperitoneum. There is diffuse stranding of the mesentery and omental fat. Small ascites. Mild apparent fatty infiltration of the liver. A 1 cm hypodense lesion in the left lobe of the liver is not well characterized but likely represents a cyst or hemangioma. The gallbladder is mildly distended. No calcified gallstone identified. The pancreas, spleen, adrenal glands appear unremarkable. There bilateral renal parapelvic cysts. Multiple smaller renal hypodense lesions are not well characterized but likely represent cysts. There is no hydronephrosis on either side. The visualized ureters and urinary bladder appear unremarkable. Hysterectomy. There is sigmoid diverticulosis  with muscular hypertrophy. No active inflammatory changes.  There is inflammatory changes and thickening of the body, and antrum of the stomach. Multiple loculated extraluminal air noted in the anterior abdomen and along the greater curvature of the stomach. No extraluminal extravasation of oral contrast identified. There is no evidence of bowel obstruction. The visualized appendix appears unremarkable. There is mild aortoiliac atherosclerotic disease. No portal venous gas identified. There is no adenopathy. There is a small fat containing umbilical hernia. There is mild osteopenia and degenerative changes of the spine. No acute fracture. IMPRESSION: Large amount of pneumoperitoneum most concerning for bowel perforation. The site of presumed bowel perforation is not identified with certainty on the CT. There is thickening of the body and antrum of the stomach. No extraluminal oral contrast identified. Clinical correlation and surgical consult is advised. Sigmoid diverticulosis without active inflammatory changes. Electronically Signed: By: Anner Crete M.D. On: 01/18/2016 19:39   Ct Image Guided Drainage Percut Cath  Peritoneal Retroperit  01/27/2016  INDICATION: Status post exploratory laparotomy for colonic perforation with postoperative fluid collections EXAM: CT GUIDED DRAINAGE OF PERIHEPATIC AND MID ABDOMINAL ABSCESS MEDICATIONS: The patient is currently admitted to the hospital and receiving intravenous antibiotics. The antibiotics were administered within an appropriate time frame prior to the initiation of the procedure. ANESTHESIA/SEDATION: None COMPLICATIONS: None immediate. TECHNIQUE: Informed written consent was obtained from the patient's husband after a thorough discussion of the procedural risks, benefits and alternatives. All questions were addressed. Maximal Sterile Barrier Technique was utilized including caps, mask, sterile gowns, sterile gloves, sterile drape, hand hygiene and skin  antiseptic. A timeout was performed prior to the initiation of the procedure. PROCEDURE: The right mid abdominal wall was prepped with Chlorhexidine in a sterile fashion, and a sterile drape was applied covering the operative field. A sterile gown and sterile gloves were used for the procedure. Local anesthesia was provided with 1% Lidocaine. Utilizing CT guidance, a 73 French pigtail catheter was placed utilizing a quick stick technique into the large anterior mid abdominal collection containing contrast material indicative of communication with the adjacent bowel loops. The catheter was sutured into place and placed to external suction drainage. Utilizing a similar technique, a second 47 French pigtail catheter was placed in the perihepatic fluid collection. This also demonstrates some increased density within consistent with contrast from communication with adjacent bowel loops. The catheter was affixed at the skin surface and placed to external suction drainage. Approximately 550 mL of brownish liquid likely related to bowel contents was aspirated. Samples were sent for laboratory evaluation. The patient tolerated the procedure well and was returned to her room in satisfactory condition. FINDINGS: Both fluid collections demonstrated increased amount of air as well as increasing opacification with orally administered contrast material consistent with communication with the adjacent bowel loops. The fluid obtained from the collections has the appearance of frank bowel contents as opposed to focal pockets of purulent material. IMPRESSION: Successful placement of two 14 French drainage catheters as described above. The collections drained were nearly completely decompressed. Contrast was noted in both collections which is a slight change from the recent CT examination 0 01/24/13 consistent with communication with adjacent bowel loops. Electronically Signed   By: Inez Catalina M.D.   On: 01/27/2016 15:33    Assessment:  ZORIAH PULICE is a 75 y.o. female With complicated post op course after perforated diverticulitis with multiple abscesses, dehisced incision and fistula. 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  FU cx from repeat surgery continues with Pseudomonas  sensitive to cipro Changed to invanz from cipro 4/15 due to increasing wbc  Recommendations Will continue daptomycin for the VRE - only other alternative would be linezolid but this will suppress bone marrow and she is already anemic- getting 2 units today) Cipro was changed to ertapenem 4/15 but this does not cover pseudomonas (despite being a carbapenem it does not cover ertapenem) Will change to meropenem at this point- can dc flagyl since meropenem will provide great anaerobic coverage She will likely need prolonged course with duration based on timing of tube removal etc She has advanced dementia-  Thank you very much for the consult. Will follow with you.  Chandy Tarman P   02/09/2016, 4:22 PM

## 2016-02-10 ENCOUNTER — Inpatient Hospital Stay: Payer: Medicare Other

## 2016-02-10 ENCOUNTER — Encounter: Payer: Self-pay | Admitting: Radiology

## 2016-02-10 LAB — BASIC METABOLIC PANEL
ANION GAP: 4 — AB (ref 5–15)
BUN: 21 mg/dL — ABNORMAL HIGH (ref 6–20)
CALCIUM: 7.7 mg/dL — AB (ref 8.9–10.3)
CO2: 26 mmol/L (ref 22–32)
CREATININE: 0.67 mg/dL (ref 0.44–1.00)
Chloride: 106 mmol/L (ref 101–111)
GFR calc non Af Amer: >60 mL/min (ref >60)
Glucose, Bld: 131 mg/dL — ABNORMAL HIGH (ref 65–99)
Potassium: 3.3 mmol/L — ABNORMAL LOW (ref 3.5–5.1)
SODIUM: 136 mmol/L (ref 135–145)

## 2016-02-10 LAB — CBC
HCT: 29.4 % — ABNORMAL LOW (ref 35.0–47.0)
Hemoglobin: 10 g/dL — ABNORMAL LOW (ref 12.0–16.0)
MCH: 30.9 pg (ref 26.0–34.0)
MCHC: 33.9 g/dL (ref 32.0–36.0)
MCV: 91 fL (ref 80.0–100.0)
PLATELETS: 400 10*3/uL (ref 150–440)
RBC: 3.23 MIL/uL — AB (ref 3.80–5.20)
RDW: 14.9 % — AB (ref 11.5–14.5)
WBC: 11.3 10*3/uL — AB (ref 3.6–11.0)

## 2016-02-10 LAB — GLUCOSE, CAPILLARY
GLUCOSE-CAPILLARY: 148 mg/dL — AB (ref 65–99)
GLUCOSE-CAPILLARY: 141 mg/dL — AB (ref 65–99)
GLUCOSE-CAPILLARY: 122 mg/dL — AB (ref 65–99)
GLUCOSE-CAPILLARY: 127 mg/dL — AB (ref 65–99)
Glucose-Capillary: 133 mg/dL — ABNORMAL HIGH (ref 65–99)
Glucose-Capillary: 117 mg/dL — ABNORMAL HIGH (ref 65–99)

## 2016-02-10 LAB — MAGNESIUM: MAGNESIUM: 1.8 mg/dL (ref 1.7–2.4)

## 2016-02-10 MED ORDER — IOPAMIDOL (ISOVUE-300) INJECTION 61%
85.0000 mL | Freq: Once | INTRAVENOUS | Status: AC | PRN
  Administered 2016-02-10: 85 mL via INTRAVENOUS

## 2016-02-10 MED ORDER — ENSURE ENLIVE PO LIQD
237.0000 mL | Freq: Three times a day (TID) | ORAL | Status: DC
Start: 2016-02-10 — End: 2016-02-14
  Administered 2016-02-11 – 2016-02-14 (×9): 237 mL via ORAL

## 2016-02-10 MED ORDER — POTASSIUM CHLORIDE CRYS ER 20 MEQ PO TBCR
20.0000 meq | EXTENDED_RELEASE_TABLET | Freq: Once | ORAL | Status: AC
  Administered 2016-02-10: 20 meq via ORAL
  Filled 2016-02-10: qty 1

## 2016-02-10 MED ORDER — TRACE MINERALS CR-CU-MN-SE-ZN 10-1000-500-60 MCG/ML IV SOLN
INTRAVENOUS | Status: AC
  Administered 2016-02-10: 18:00:00 via INTRAVENOUS
  Filled 2016-02-10: qty 1440

## 2016-02-10 MED ORDER — METOPROLOL TARTRATE 1 MG/ML IV SOLN
5.0000 mg | Freq: Once | INTRAVENOUS | Status: AC
  Administered 2016-02-10: 5 mg via INTRAVENOUS
  Filled 2016-02-10: qty 5

## 2016-02-10 MED ORDER — DIATRIZOATE MEGLUMINE & SODIUM 66-10 % PO SOLN
15.0000 mL | ORAL | Status: AC
  Administered 2016-02-10 (×2): 15 mL via ORAL

## 2016-02-10 MED ORDER — SODIUM CHLORIDE 0.9 % IV BOLUS (SEPSIS)
500.0000 mL | Freq: Once | INTRAVENOUS | Status: AC
  Administered 2016-02-10: 500 mL via INTRAVENOUS

## 2016-02-10 MED ORDER — FAT EMULSION 20 % IV EMUL
500.0000 mL | INTRAVENOUS | Status: AC
  Administered 2016-02-10: 500 mL via INTRAVENOUS
  Filled 2016-02-10: qty 500

## 2016-02-10 NOTE — Plan of Care (Signed)
Problem: Health Behavior/Discharge Planning: Goal: Ability to manage health-related needs will improve Outcome: Not Progressing Needs assistance from family.

## 2016-02-10 NOTE — Consult Note (Signed)
PARENTER[facility] NUTRITION CONSULT NOTE - FOLLOW UP   Pharmacy Consult for electrolytes/glucose Indication: TPN  Allergies  Allergen Reactions  . Galantamine Other (See Comments)    Patient Measurements: Height: 5\' 7"  (170.2 cm) Weight: 112 lb 1.6 oz (50.848 kg) IBW/kg (Calculated) : 61.6  Labs:  Recent Labs  02/08/16 0607 02/10/16 0603  WBC 13.3* 11.3*  HGB 10.6* 10.0*  HCT 31.4* 29.4*  PLT 474* 400     Recent Labs  02/08/16 0607 02/09/16 0751 02/10/16 0603  NA 136 136 136  K 3.5 3.5 3.3*  CL 106 105 106  CO2 25 27 26   GLUCOSE 144* 149* 131*  BUN 19 18 21*  CREATININE 0.59 0.53 0.67  CALCIUM 7.9* 7.9* 7.7*  MG  --  1.7 1.8  PHOS  --  3.5  --    Estimated Creatinine Clearance: 50.2 mL/min (by C-G formula based on Cr of 0.67).    Recent Labs  02/09/16 2333 02/10/16 0409 02/10/16 0755  GLUCAP 133* 133* 117*   Insulin Requirements in the past 24 hours:  3 units (SSI q4h)  Current Nutrition:  Clinimix E 5/20 started 01/28/16, now at 60 ml/hr Clear liquids 4/13  Assessment: All electrolytes wnl except potassium. Patient with diet orders and taking oral medications. Will give KCl 20 mEq PO x 1, recheck electrolytes with AM labs.  Plan:  Will follow up electrolytes with am labs.   Pharmacy will continue to monitor and adjust per consult.   Garlon HatchetJody Audriana Aldama, PharmD Clinical Pharmacist  02/10/2016 8:16 AM

## 2016-02-10 NOTE — Progress Notes (Signed)
[facility] PROGRESS NOTE Date of Admission:  01/18/2016     ID: Katherine Wood is a 75 y.o. female with abd abscesses, pseudomonas and VRE  Active Problems:   Bowel perforation ([place])   Hernia of abdominal cavity   Diverticulitis of colon with perforation   Protein-calorie malnutrition, severe   Abscess of abdominal cavity (H[facility])  Subjective: On meropenem and dapto now No fevers since 4/16. wbc down to 11 Husband at bedside  ROS  Unable to obtain Medications:  Antibiotics Given (last 72 hours)    Date/Time Action Medication Dose Rate   02/07/16 1732 Given   DAPTOmycin (CUBICIN) 500 mg in sodium chloride 0.9 % IVPB 500 mg 220 mL/hr   02/07/16 2245 Given   metroNIDAZOLE (FLAGYL) IVPB 500 mg 500 mg 100 mL/hr   02/08/16 0500 Given   metroNIDAZOLE (FLAGYL) IVPB 500 mg 500 mg 100 mL/hr   02/08/16 1148 Given   metroNIDAZOLE (FLAGYL) IVPB 500 mg 500 mg 100 mL/hr   02/08/16 1148 Given   ertapenem (INVANZ) 1 g in sodium chloride 0.9 % 50 mL IVPB 1 g 100 mL/hr   02/08/16 1737 Given   DAPTOmycin (CUBICIN) 500 mg in sodium chloride 0.9 % IVPB 500 mg 220 mL/hr   02/08/16 2235 Given   metroNIDAZOLE (FLAGYL) IVPB 500 mg 500 mg 100 mL/hr   02/09/16 0433 Given   metroNIDAZOLE (FLAGYL) IVPB 500 mg 500 mg 100 mL/hr   02/09/16 0836 Given   ertapenem (INVANZ) 1 g in sodium chloride 0.9 % 50 mL IVPB 1 g 100 mL/hr   02/09/16 1151 Given   metroNIDAZOLE (FLAGYL) IVPB 500 mg 500 mg 100 mL/hr   02/09/16 1817 Given   meropenem (MERREM) 1 g in sodium chloride 0.9 % 100 mL IVPB 1 g 200 mL/hr   02/09/16 1854 Given   DAPTOmycin (CUBICIN) 500 mg in sodium chloride 0.9 % IVPB 500 mg 220 mL/hr   02/10/16 0536 Given   meropenem (MERREM) 1 g in sodium chloride 0.9 % 100 mL IVPB 1 g 200 mL/hr   02/10/16 1355 Given   meropenem (MERREM) 1 g in sodium chloride 0.9 % 100 mL IVPB 1 g 200 mL/hr     . sodium chloride   Intravenous Once  . antiseptic oral rinse  7 mL Mouth Rinse QID   . chlorhexidine gluconate (SAGE KIT)  15 mL Mouth Rinse BID  . DAPTOmycin (CUBICIN)  IV  500 mg Intravenous Q24H  . donepezil  10 mg Oral QHS  . enoxaparin (LOVENOX) injection  40 mg Subcutaneous QHS  . feeding supplement (ENSURE ENLIVE)  237 mL Oral TID  . fluconazole (DIFLUCAN) IV  200 mg Intravenous Q24H  . insulin aspart  0-9 Units Subcutaneous 6 times per day  . meropenem (MERREM) IV  1 g Intravenous 3 times per day  . metoprolol tartrate  12.5 mg Oral BID  . pantoprazole (PROTONIX) IV  40 mg Intravenous Q12H  . sodium chloride flush  10-40 mL Intracatheter Q12H    Objective: Vital signs in last 24 hours: Temp:  [97.5 F (36.4 C)-99.7 F (37.6 C)] 97.5 F (36.4 C) (04/18 1317) Pulse Rate:  [94-123] 94 (04/18 1317) Resp:  [18-28] 18 (04/18 1317) BP: (110-121)/(65-72) 119/67 mmHg (04/18 1317) SpO2:  [97 %-98 %] 97 % (04/18 1317) Weight:  [50.848 kg (112 lb 1.6 oz)-52.935 kg (116 lb 11.2 oz)] 52.935 kg (116 lb 11.2 oz) (04/18 1343) Constitutional: Frail, dementia HENT: [place]/AT, PERRLA, no scleral icterus Mouth/Throat: Oropharynx  is clear and moist. No oropharyngeal exudate.  Cardiovascular: Normal rate, regular rhythm and normal heart sounds. Pulmonary/Chest: Effort normal and breath sounds normal. No respiratory distress. has no wheezes.  Neck = supple, no nuchal rigidity Abdominal: Soft. Incision dehisced is quite clean,  has multple drains, one labeled fistula, has colostomy with soft stools. Lymphadenopathy: no cervical adenopathy. No axillary adenopathy Neurological: dementia Skin: Skin is warm and dry. No rash noted. No erythema.   Lab Results  Recent Labs  02/08/16 0607 02/09/16 0751 02/10/16 0603  WBC 13.3*  --  11.3*  HGB 10.6*  --  10.0*  HCT 31.4*  --  29.4*  NA 136 136 136  K 3.5 3.5 3.3*  CL 106 105 106  CO2 '25 27 26  ' BUN 19 18 21*  CREATININE 0.59 0.53 0.67    Microbiology: Results for orders placed or performed during the hospital encounter  of 01/18/16  MRSA PCR Screening     Status: None   Collection Time: 01/19/16  3:02 AM  Result Value Ref Range Status   MRSA by PCR NEGATIVE NEGATIVE Final    Comment:        The GeneXpert MRSA Assay (FDA approved for NASAL specimens only), is one component of a comprehensive MRSA colonization surveillance program. It is not intended to diagnose MRSA infection nor to guide or monitor treatment for MRSA infections.   Culture, routine-abscess     Status: None   Collection Time: 01/27/16  2:05 PM  Result Value Ref Range Status   Specimen Description ABSCESS  Final   Special Requests Normal  Final   Gram Stain   Final    MODERATE WBC SEEN MANY GRAM POSITIVE COCCI IN PAIRS MODERATE GRAM NEGATIVE RODS    Culture   Final    HEAVY GROWTH VANCOMYCIN RESISTANT ENTEROCOCCUS MODERATE GROWTH PSEUDOMONAS AERUGINOSA REFER TO OTHER ABSCESS CULTURE FOR SUSCEPTIBILITIES    Report Status 01/31/2016 FINAL  Final   Organism ID, Bacteria PSEUDOMONAS AERUGINOSA  Final      Susceptibility   Pseudomonas aeruginosa - MIC*    CEFTAZIDIME <=1 SENSITIVE Sensitive     CIPROFLOXACIN <=0.25 SENSITIVE Sensitive     GENTAMICIN <=1 SENSITIVE Sensitive     IMIPENEM 1 SENSITIVE Sensitive     CEFEPIME <=1 SENSITIVE Sensitive     * MODERATE GROWTH PSEUDOMONAS AERUGINOSA  Culture, routine-abscess     Status: None   Collection Time: 01/27/16  2:25 PM  Result Value Ref Range Status   Specimen Description ABSCESS  Final   Special Requests Normal  Final   Gram Stain   Final    MODERATE WBC SEEN MANY GRAM POSITIVE COCCI IN PAIRS MANY GRAM NEGATIVE RODS    Culture   Final    HEAVY GROWTH VANCOMYCIN RESISTANT ENTEROCOCCUS MODERATE GROWTH PSEUDOMONAS AERUGINOSA VRE HAVE INTRINSIC RESISTANCE TO MOST COMMONLY USED ANTIBIOTICS AND THE ABILITY TO ACQUIRE RESISTANCE TO MOST AVAILABLE ANTIBIOTICS.    Report Status 01/31/2016 FINAL  Final   Organism ID, Bacteria PSEUDOMONAS AERUGINOSA  Final   Organism ID,  Bacteria VANCOMYCIN RESISTANT ENTEROCOCCUS  Final      Susceptibility   Pseudomonas aeruginosa - MIC*    CEFTAZIDIME <=1 SENSITIVE Sensitive     CIPROFLOXACIN <=0.25 SENSITIVE Sensitive     GENTAMICIN <=1 SENSITIVE Sensitive     IMIPENEM 1 SENSITIVE Sensitive     CEFEPIME <=1 SENSITIVE Sensitive     * MODERATE GROWTH PSEUDOMONAS AERUGINOSA   Vancomycin resistant enterococcus - MIC*  AMPICILLIN >=32 RESISTANT Resistant     VANCOMYCIN >=32 RESISTANT Resistant     GENTAMICIN SYNERGY SENSITIVE Sensitive     LINEZOLID Value in next row Sensitive      SENSITIVE2    * HEAVY GROWTH VANCOMYCIN RESISTANT ENTEROCOCCUS  C difficile quick scan w PCR reflex     Status: None   Collection Time: 02/01/16 10:55 AM  Result Value Ref Range Status   C Diff antigen NEGATIVE NEGATIVE Final   C Diff toxin NEGATIVE NEGATIVE Final   C Diff interpretation Negative for C. difficile  Final  Anaerobic culture     Status: None   Collection Time: 02/01/16 11:56 AM  Result Value Ref Range Status   Specimen Description WOUND  Final   Special Requests NONE  Final   Culture NO ANAEROBES ISOLATED  Final   Report Status 02/05/2016 FINAL  Final  Culture, routine-abscess     Status: None   Collection Time: 02/01/16 11:56 AM  Result Value Ref Range Status   Specimen Description WOUND  Final   Special Requests NONE  Final   Gram Stain MANY WBC SEEN FEW GRAM NEGATIVE RODS   Final   Culture MODERATE GROWTH PSEUDOMONAS AERUGINOSA  Final   Report Status 02/04/2016 FINAL  Final   Organism ID, Bacteria PSEUDOMONAS AERUGINOSA  Final      Susceptibility   Pseudomonas aeruginosa - MIC*    CEFTAZIDIME 4 SENSITIVE Sensitive     CIPROFLOXACIN <=0.25 SENSITIVE Sensitive     GENTAMICIN <=1 SENSITIVE Sensitive     IMIPENEM 1 SENSITIVE Sensitive     CEFEPIME 2 SENSITIVE Sensitive     PIP/TAZO Value in next row Sensitive      SENSITIVE8    AMPICILLIN/SULBACTAM Value in next row Resistant      RESISTANT>=32    * MODERATE  GROWTH PSEUDOMONAS AERUGINOSA    Studies/Results:  Dg Chest 1 View  01/19/2016  CLINICAL DATA:  Endotracheal tube placement.  Initial encounter. EXAM: CHEST 1 VIEW COMPARISON:  None. FINDINGS: The patient's endotracheal tube is seen ending 3-4 cm above the carina. An enteric tube is noted extending below the diaphragm. The lungs are well-aerated. Peribronchial thickening is noted. Minimal bibasilar atelectasis is noted. There is no evidence of pleural effusion or pneumothorax. The cardiomediastinal silhouette is within normal limits. No acute osseous abnormalities are seen. IMPRESSION: 1. Endotracheal tube seen ending 3-4 cm above the carina. 2. Peribronchial thickening noted. Minimal bibasilar atelectasis noted. Electronically Signed   By: Garald Balding M.D.   On: 01/19/2016 01:55   Dg Abd 1 View  02/01/2016  CLINICAL DATA:  Check nasogastric catheter placement EXAM: ABDOMEN - 1 VIEW COMPARISON:  01/31/2016. FINDINGS: Multiple surgical and percutaneous drains are identified similar to that seen on recent exams. No nasogastric catheter is noted at this time. If one has been placed it likely lies in the more proximal esophagus. No other focal abnormality is noted. IMPRESSION: Nasogastric catheter not visualized. Multiple surgical and percutaneous drains are again seen. Electronically Signed   By: Inez Catalina M.D.   On: 02/01/2016 21:04   Ct Abdomen Pelvis W Contrast  02/10/2016  CLINICAL DATA:  Followup abdominal abscesses. EXAM: CT ABDOMEN AND PELVIS WITH CONTRAST TECHNIQUE: Multidetector CT imaging of the abdomen and pelvis was performed using the standard protocol following bolus administration of intravenous contrast. CONTRAST:  12m ISOVUE-300 IOPAMIDOL (ISOVUE-300) INJECTION 61% COMPARISON:  01/31/2016 FINDINGS: Lower chest: Dependent changes and pleural thickening overlying the posterior right lower lobe noted. No  pleural effusion identified. Hepatobiliary: Cyst overlying the lateral segment of  left lobe of liver noted, image 20 of series 2. Subhepatic abscess is identified along the undersurface of the right lobe of liver measuring 5 x 2.3 x 4.6 cm. This is more well defined than on the previous exam. Gallbladder appears normal. No biliary dilatation. Pancreas: Normal appearance of the pancreas. Spleen: No focal splenic abnormality. Small fluid collection along the superior margin of the spleen measures 1.9 x 3.2 cm, image 17 of series 2. Previously this measured 2.8 x 4.3 cm. Adrenals/Urinary Tract: The adrenal glands are negative. Bilateral renal sinus cysts are noted. Urinary bladder appears normal. Stomach/Bowel: Fluid collection along the greater curvature the stomach measures 3.4 x 1.8 by 3.0 cm. Previously this measured 6.6 x 4.7 x 7.5 cm. No pathologic dilatation of the large or small bowel loops. There is a left lower quadrant colostomy. Vascular/Lymphatic: Aortic atherosclerosis noted. No upper abdominal adenopathy. No pelvic or inguinal adenopathy. Reproductive: Previous hysterectomy. Complex fluid collection within the right side of pelvis is again identified and is favored to represent hydrosalpinx. This measures 5.4 x 3.7 cm, image 71 of series 2 Other: Left lower quadrant abscess measures 1.5 x 1.8 cm, image 33 of series 2. Previously 2.4 x 2.7 cm. There is a JP drain within the upper abdomen. This does not appear to be situated in any specific fluid collection. Additionally, there is a right lower quadrant percutaneous pigtail drainage catheter, image 51 of series 2. This does not appear to be situated in any specific fluid collection either. Musculoskeletal: No aggressive lytic or sclerotic bone lesions identified. IMPRESSION: 1. There has been interval decrease in volume of the perisplenic, perigastric fluid collections and left lower quadrant fluid collections. 2. Subhepatic abscess along the inferior right lobe of liver appears more well-defined than on the previous exam. 3. Suspected  right-sided hydrosalpinx. 4. There are 2 percutaneous drainage catheters within the abdomen. At this time, these do not appear to be situated in any particular fluid collection. Electronically Signed   By: Kerby Moors M.D.   On: 02/10/2016 10:33   Ct Abdomen Pelvis W Contrast  01/31/2016  CLINICAL DATA:  Interventional abscess questionable fistula status post drainage. She was doing okay no complication. Gram-positive out from the drain as well as Pseudomonas. Started on vancomycin and Zosyn EXAM: CT ABDOMEN AND PELVIS WITH CONTRAST TECHNIQUE: Multidetector CT imaging of the abdomen and pelvis was performed using the standard protocol following bolus administration of intravenous contrast. CONTRAST:  167m ISOVUE-300 IOPAMIDOL (ISOVUE-300) INJECTION 61% COMPARISON:  01/25/2016 FINDINGS: Lung bases: Moderate bilateral pleural effusions. Dependent atelectasis mostly in the lower lobes. Heart mildly enlarged. Findings are similar to the prior exam. Peritoneal cavity: There are residual collections. The larger collection noted adjacent to the posterior medial aspect of the liver and right pericolic gutter has been mostly evacuated following percutaneous drainage with a pigtail catheter. Catheter tip lies adjacent to the inferior margin of the liver. There is a small amount of fluid with small bubbles of air along the posterior medial aspect of the liver reflecting a residual collection. Fluid collection along the greater curvature of the stomach has mildly increased in size, now measuring 7.5 x 4.7 x 6.6 cm. Small fluid collections along the left anterior para renal space are similar. Fluid collection seen anterior to the pancreas on the prior study is no longer present. There are no new abdominal or pelvic fluid collections. Inflammatory type stranding throughout the peritoneal fat is similar  to the prior study. Hepatobiliary: 8 mm cyst along the anterior margin of the left lobe lateral segment. No other liver  masses or lesions. Gallbladder is distended but otherwise unremarkable. No bile duct dilation. Spleen, pancreas, adrenal glands:  Unremarkable. Kidneys, ureters, bladder: There are bilateral renal cortical and renal sinus cysts, stable. No hydronephrosis. Ureters normal in course and in caliber. Bladder is unremarkable. Uterus and adnexa: Uterus surgically absent. Stable right adnexal cyst, likely ovarian, measuring 4.2 cm. Lymph nodes:  No pathologically enlarged lymph nodes. Gastrointestinal: Inferior sigmoid colon terminates in a short Hartman's pouch. Left colon is diverted into a left mid abdomen colostomy. No evidence of bowel obstruction. No convincing bowel inflammation or ischemia. Minimal free air is seen in the anterior peritoneal cavity of the upper abdomen. This was present on the prior exam and has decreased in amount. Abdominal wall: Persistent soft tissue defect along the anterior abdominal midline with intact fascia. Drainage catheter lies in the pelvis. Under the pigtail catheter lies in the right mid abdomen. Another surgical drains catheter or extends across the upper abdomen below the lower greater curvature of the stomach. IMPRESSION: 1. There has been overall mild improvement when compared to the prior CT. 2. The larger collection noted along the right anterior para renal space and along the posterior medial aspect of the liver has been mostly evacuated following percutaneous drainage. A collections seen adjacent to the posterior mid to distal stomach is no longer present. There is a collection along the greater curvature of the stomach which is mildly increased in size from the prior study. Small fluid collections along the left anterior perirenal space are similar to the prior exam. Diffuse mesenteric inflammatory type change is also stable. Minimal free intraperitoneal air is decreased when compared the prior exam. 3. No new palpable intraperitoneal collections. 4. Persistent moderate  pleural effusions with associated lower lobe atelectasis similar to the prior exam. Electronically Signed   By: Lajean Manes M.D.   On: 01/31/2016 12:06   Ct Abdomen Pelvis W Contrast  01/25/2016  CLINICAL DATA:  75 year old female with followup exploratory laparotomy for bowel perforation. Hartmann's procedure. Continued drainage from abdominal drains. EXAM: CT ABDOMEN AND PELVIS WITH CONTRAST TECHNIQUE: Multidetector CT imaging of the abdomen and pelvis was performed using the standard protocol following bolus administration of intravenous contrast. CONTRAST:  169m ISOVUE-300 IOPAMIDOL (ISOVUE-300) INJECTION 61% COMPARISON:  01/18/2016 CT FINDINGS: Lower chest: Moderate bilateral pleural effusions and bilateral lower lung atelectasis noted. Mild cardiomegaly and coronary artery calcifications again identified. Hepatobiliary: The liver and gallbladder are unremarkable. No biliary dilatation identified. Pancreas: Unremarkable Spleen: Unremarkable Adrenals/Urinary Tract: Bilateral renal cysts and renal cortical atrophy again noted. The bladder and adrenal glands are unremarkable. Stomach/Bowel: There is no evidence of bowel obstruction. A left abdominal ostomy is noted. Vascular/Lymphatic: Aortic atherosclerotic calcifications noted without aneurysm. No enlarged lymph nodes. Reproductive: Patient is status post hysterectomy. Other: Multiple all intra-abdominal and pelvic abscesses containing fluid and gas are identified and include the following: A 4.8 x 11 x 16.8 cm abscess along the posterior inferior medial liver. A 3.6 x 7.1 cm abscess adjacent to the lateral aspect of the stomach (image 21). A 3.8 x 5.6 cm abscess in the left paramedian mid abdomen (image 37). A 3.3 x 3.5 cm abscess in the region of the lower gastrohepatic ligament (image 28 and 29). A 4 x 11.2 cm abscess within the anterior lower abdomen/ upper pelvis (image 57). This abscess has high density material its left aspect and  maybe connected to  bowel as this has the appearance of oral contrast. A definite connection is not located. A 3.2 x 3.7 cm posterior pelvic abscess (image 80). Small foci of pneumoperitoneum/ free air noted. Two abdominal drains are present, 1 with tip in the anterior left abdomen and other with tip in the high left abdomen posterior to the spleen. No collections are identified adjacent to the catheter tips. Diffuse subcutaneous edema is noted. A small right inguinal hernia containing fat is noted. Musculoskeletal: No acute abnormalities identified. IMPRESSION: Multiple intra-abdominal and intrapelvic abscesses as described, with the largest measuring 4.8 x 11 x 16.8 cm along the posterior inferior medial liver. The 4 x 11.2 cm abscess within the anterior lower abdomen/ upper pelvis as high-density material and could represent oral contrast from connection to bowel. Postoperative changes as described with small residual foci of pneumoperitoneum. Moderate bilateral pleural effusions and bilateral lower lung atelectasis. These results were discussed with Dr. Bary Castilla on 01/25/2016 at 11:15 AM. Electronically Signed   By: Margarette Canada M.D.   On: 01/25/2016 11:16   Ct Abdomen Pelvis W Contrast  01/18/2016  ADDENDUM REPORT: 01/18/2016 19:52 ADDENDUM: These results were called by telephone at the time of interpretation on 01/18/2016 at 7:51 pm to Dr. Hinda Kehr , who verbally acknowledged these results. Electronically Signed   By: Anner Crete M.D.   On: 01/18/2016 19:52  01/18/2016  CLINICAL DATA:  75 year old female with abdominal pain and diarrhea. No surgery. EXAM: CT ABDOMEN AND PELVIS WITH CONTRAST TECHNIQUE: Multidetector CT imaging of the abdomen and pelvis was performed using the standard protocol following bolus administration of intravenous contrast. CONTRAST:  28m ISOVUE-300 IOPAMIDOL (ISOVUE-300) INJECTION 61% COMPARISON:  CT dated 10/09/2007 FINDINGS: Linear bibasilar atelectasis/ scarring. There is large  pneumoperitoneum. There is diffuse stranding of the mesentery and omental fat. Small ascites. Mild apparent fatty infiltration of the liver. A 1 cm hypodense lesion in the left lobe of the liver is not well characterized but likely represents a cyst or hemangioma. The gallbladder is mildly distended. No calcified gallstone identified. The pancreas, spleen, adrenal glands appear unremarkable. There bilateral renal parapelvic cysts. Multiple smaller renal hypodense lesions are not well characterized but likely represent cysts. There is no hydronephrosis on either side. The visualized ureters and urinary bladder appear unremarkable. Hysterectomy. There is sigmoid diverticulosis with muscular hypertrophy. No active inflammatory changes. There is inflammatory changes and thickening of the body, and antrum of the stomach. Multiple loculated extraluminal air noted in the anterior abdomen and along the greater curvature of the stomach. No extraluminal extravasation of oral contrast identified. There is no evidence of bowel obstruction. The visualized appendix appears unremarkable. There is mild aortoiliac atherosclerotic disease. No portal venous gas identified. There is no adenopathy. There is a small fat containing umbilical hernia. There is mild osteopenia and degenerative changes of the spine. No acute fracture. IMPRESSION: Large amount of pneumoperitoneum most concerning for bowel perforation. The site of presumed bowel perforation is not identified with certainty on the CT. There is thickening of the body and antrum of the stomach. No extraluminal oral contrast identified. Clinical correlation and surgical consult is advised. Sigmoid diverticulosis without active inflammatory changes. Electronically Signed: By: AAnner CreteM.D. On: 01/18/2016 19:39   Ct Image Guided Drainage Percut Cath  Peritoneal Retroperit  01/27/2016  INDICATION: Status post exploratory laparotomy for colonic perforation with postoperative  fluid collections EXAM: CT GUIDED DRAINAGE OF PERIHEPATIC AND MID ABDOMINAL ABSCESS MEDICATIONS: The patient is currently admitted  to the hospital and receiving intravenous antibiotics. The antibiotics were administered within an appropriate time frame prior to the initiation of the procedure. ANESTHESIA/SEDATION: None COMPLICATIONS: None immediate. TECHNIQUE: Informed written consent was obtained from the patient's husband after a thorough discussion of the procedural risks, benefits and alternatives. All questions were addressed. Maximal Sterile Barrier Technique was utilized including caps, mask, sterile gowns, sterile gloves, sterile drape, hand hygiene and skin antiseptic. A timeout was performed prior to the initiation of the procedure. PROCEDURE: The right mid abdominal wall was prepped with Chlorhexidine in a sterile fashion, and a sterile drape was applied covering the operative field. A sterile gown and sterile gloves were used for the procedure. Local anesthesia was provided with 1% Lidocaine. Utilizing CT guidance, a 23 French pigtail catheter was placed utilizing a quick stick technique into the large anterior mid abdominal collection containing contrast material indicative of communication with the adjacent bowel loops. The catheter was sutured into place and placed to external suction drainage. Utilizing a similar technique, a second 82 French pigtail catheter was placed in the perihepatic fluid collection. This also demonstrates some increased density within consistent with contrast from communication with adjacent bowel loops. The catheter was affixed at the skin surface and placed to external suction drainage. Approximately 550 mL of brownish liquid likely related to bowel contents was aspirated. Samples were sent for laboratory evaluation. The patient tolerated the procedure well and was returned to her room in satisfactory condition. FINDINGS: Both fluid collections demonstrated increased amount  of air as well as increasing opacification with orally administered contrast material consistent with communication with the adjacent bowel loops. The fluid obtained from the collections has the appearance of frank bowel contents as opposed to focal pockets of purulent material. IMPRESSION: Successful placement of two 14 French drainage catheters as described above. The collections drained were nearly completely decompressed. Contrast was noted in both collections which is a slight change from the recent CT examination 0 01/24/13 consistent with communication with adjacent bowel loops. Electronically Signed   By: Inez Catalina M.D.   On: 01/27/2016 15:33   Assessment:  Katherine Wood is a 75 y.o. female With complicated post op course after perforated diverticulitis with multiple abscesses, dehisced incision and fistula. 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 Will follow with you.  Iola, Katherine Wood   02/10/2016, 2:46 PM

## 2016-02-10 NOTE — Progress Notes (Signed)
Nutrition Follow-up  DOCUMENTATION CODES:   Severe malnutrition in context of acute illness/injury  INTERVENTION:  Monitor intake. Pt currently not meeting nutritional needs.  Await diet progression to soft diet.  Continue TPN at this time. Continue ensure enlive for added nutrition.   NUTRITION DIAGNOSIS:   Inadequate oral intake related to altered GI function as evidenced by NPO status.    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  [facility]  Calorie count results:  Breakfast: NPO per CNA at bedside today Lunch: Yesterday ticket 100% of jello, bite of soup and 2 sips of ensure (100 kcals and 0 gm of protein) Supper: no ticket found from last night  Medications reviewed: aspart, protonix Labs reviewed: K 3.3, glucose 131  UOP: 2125ml last 24 hr   Diet Order:  Diet full liquid Room service appropriate?: Yes; Fluid consistency:: Thin .TPN (CLINIMIX-E) Adult .TPN (CLINIMIX-E) Adult  Skin:  Reviewed, no issues  Last BM:  ostomy output noted on 4/13  Height:   Ht Readings from Last 1 Encounters:  01/19/16 5\' 7"  (1.702 m)    Weight:   Wt Readings from Last 1 Encounters:  02/10/16 112 lb 1.6 oz (50.848 kg)    Ideal Body Weight:     BMI:  Body mass index is 17.55 kg/(m^2).  Estimated Nutritional Needs:   Kcal:  1450-1740 kcals/d  Protein:  (1.0-1.2 g/kg) 58-70 g/d  Fluid:  1450-175340ml/d  EDUCATION NEEDS:   Education needs no appropriate at this time  Etoy Mcdonnell B. Freida BusmanAllen, RD, LDN [phone] (pager) Weekend/On-Call pager [phone])

## 2016-02-10 NOTE — Progress Notes (Signed)
9 Days Post-Op   Subjective:  She had an episode of tachycardia last night which did respond to metoprolol. The underlying rhythm was felt to be in SVT. Her only symptoms morning his nausea from her CT contrast. She denies any abdominal pain. The drainage from her JP drains is decreasing. She does not have any significant abdominal discomfort.  Vital signs in last 24 hours: Temp:  [98.2 F (36.8 C)-99.7 F (37.6 C)] 99.7 F (37.6 C) (04/18 0443) Pulse Rate:  [115-123] 123 (04/18 0443) Resp:  [19-28] 28 (04/18 0443) BP: (120-129)/(65-70) 120/69 mmHg (04/18 0443) SpO2:  [97 %-98 %] 98 % (04/18 0443) Weight:  [50.848 kg (112 lb 1.6 oz)] 50.848 kg (112 lb 1.6 oz) (04/18 0500) Last BM Date: 02/09/16  Intake/Output from previous day: 04/17 0701 - 04/18 0700 In: 1322 [IV Piggyback:243; TPN:1079] Out: 2345 [Urine:2125; Drains:20; Stool:200]  GI: Her abdomen is soft with minimal incisional tenderness. She does have active bowel sounds.  Lab Results:  CBC  Recent Labs  02/08/16 0607 02/10/16 0603  WBC 13.3* 11.3*  HGB 10.6* 10.0*  HCT 31.4* 29.4*  PLT 474* 400   CMP     Component Value Date/Time   NA 136 02/10/2016 0603   NA 144 09/11/2013   K 3.3* 02/10/2016 0603   CL 106 02/10/2016 0603   CO2 26 02/10/2016 0603   GLUCOSE 131* 02/10/2016 0603   BUN 21* 02/10/2016 0603   BUN 22* 09/11/2013   CREATININE 0.67 02/10/2016 0603   CALCIUM 7.7* 02/10/2016 0603   PROT 6.5 02/04/2016 1530   ALBUMIN 2.1* 02/04/2016 1530   AST 21 02/04/2016 1530   ALT 13* 02/04/2016 1530   ALKPHOS 93 02/04/2016 1530   BILITOT 0.6 02/04/2016 1530   GFRNONAA >60 02/10/2016 0603   GFRAA >60 02/10/2016 0603   PT/INR No results for input(s): LABPROT, INR in the last 72 hours.  Studies/Results: No results found.  Assessment/Plan: CT is pending today. We are looking to see if she has any undrained fluid collections which would responsible for her persistent abdominal symptoms and elevated white  blood cell count. Her dementia complicates the situation but overall she seems to be improving. If she does have recurrent or persistent abscesses of size we may want to consider tertiary care referral for more aggressive interventional radiology assistance.

## 2016-02-10 NOTE — Care Management (Signed)
Late entry 02/09/16 Consult placed for [facility].  Patient's husband is agreeable to initiate insurance authorization process.  Patient qualifies for criteria to be placed at [facility].  Sue Lushndrea from [facility] notified.

## 2016-02-10 NOTE — Progress Notes (Signed)
Called Dr. Everlene FarrierPabon regarding patient's heart rate of 167 and per telemetry, one run of svt.  Doctor asked about patient's urine output which is WDL and will continue to monitor patient.  Arturo MortonClay, Sheilia Reznick N  02/10/2016  5:17 AM

## 2016-02-11 ENCOUNTER — Inpatient Hospital Stay
Admit: 2016-02-11 | Discharge: 2016-02-11 | Disposition: A | Discharge status: Home / Self Care | Payer: Medicare Other | Attending: Internal Medicine | Admitting: Internal Medicine

## 2016-02-11 LAB — TSH: TSH: 2.644 u[IU]/mL (ref 0.350–4.500)

## 2016-02-11 LAB — GLUCOSE, CAPILLARY
GLUCOSE-CAPILLARY: 119 mg/dL — AB (ref 65–99)
GLUCOSE-CAPILLARY: 120 mg/dL — AB (ref 65–99)
GLUCOSE-CAPILLARY: 123 mg/dL — AB (ref 65–99)
GLUCOSE-CAPILLARY: 141 mg/dL — AB (ref 65–99)
Glucose-Capillary: 108 mg/dL — ABNORMAL HIGH (ref 65–99)
Glucose-Capillary: 120 mg/dL — ABNORMAL HIGH (ref 65–99)
Glucose-Capillary: 128 mg/dL — ABNORMAL HIGH (ref 65–99)

## 2016-02-11 LAB — BASIC METABOLIC PANEL
Anion gap: 6 (ref 5–15)
BUN: 21 mg/dL — AB (ref 6–20)
CALCIUM: 7.9 mg/dL — AB (ref 8.9–10.3)
CO2: 27 mmol/L (ref 22–32)
CREATININE: 0.65 mg/dL (ref 0.44–1.00)
Chloride: 105 mmol/L (ref 101–111)
GFR calc Af Amer: >60 mL/min (ref >60)
GLUCOSE: 120 mg/dL — AB (ref 65–99)
Potassium: 3.7 mmol/L (ref 3.5–5.1)
SODIUM: 138 mmol/L (ref 135–145)

## 2016-02-11 LAB — MAGNESIUM: MAGNESIUM: 1.7 mg/dL (ref 1.7–2.4)

## 2016-02-11 LAB — PHOSPHORUS: Phosphorus: 3.2 mg/dL (ref 2.5–4.6)

## 2016-02-11 MED ORDER — ALPRAZOLAM 0.25 MG PO TABS
0.2500 mg | ORAL_TABLET | Freq: Three times a day (TID) | ORAL | Status: DC | PRN
  Administered 2016-02-11 – 2016-02-14 (×6): 0.25 mg via ORAL
  Filled 2016-02-11 (×6): qty 1

## 2016-02-11 MED ORDER — TRACE MINERALS CR-CU-MN-SE-ZN 10-1000-500-60 MCG/ML IV SOLN
INTRAVENOUS | Status: AC
  Administered 2016-02-11: 18:00:00 via INTRAVENOUS
  Filled 2016-02-11: qty 1440

## 2016-02-11 NOTE — Care Management (Signed)
Notified by [facility] that insurance has denied transfer.  I have notified MD that peer to peer is an option.  Dr. Tonita CongWoodham is currently in a procedure.   Will follow up

## 2016-02-11 NOTE — Progress Notes (Signed)
Calorie Count Note  Calorie count ordered.  Diet: Full liquids Supplements: Ensure enlive TID  Breakfast: 4/19 161 kcals and 2 gm of protein Lunch: 4/18 100 kcals and 2 gm of protein, 4/19 543 kcals and 22 gm of protein Dinner: 543 kcals and 22 gm of protein Total kcals on 4/18 736 kcals and 2 gm of protein Total kcals on 4/19 704 kcals and 24 gm of protein  Total intake: 736 kcal (50% of minimum estimated needs)  24 gm protein (41% of minimum estimated needs)  Nutrition Dx: Inadequate oral intake related to altered GI function as evidenced by NPO status, improving  Goal: Pt will meet greater than or equal to 90% of their needs  Intervention: Spoke with Dr. Tonita CongWoodham and agreeable to progressing diet to soft with ensure TID continued.    Tenise Stetler B. Freida BusmanAllen, RD, LDN [phone] (pager) Weekend/On-Call pager [phone])

## 2016-02-11 NOTE — Consult Note (Signed)
Pt seen for tachycardia.  Reviewed tele, it is SVT.  Pt is very anxious today, as per sitter in room, she did not sleep last night, no c/o worsening pain.  Assessment and plan  * SVT due to anxiety   Check TSH   Check Echo.    BP stable.   Will give oral xanax for now.    Pain management is per surgical team, under control    Full consult note to follow.    Total time spent 45 min.

## 2016-02-11 NOTE — Care Management (Signed)
[facility]

## 2016-02-11 NOTE — Progress Notes (Signed)
[facility] INFECTIOUS DISEASE PROGRESS NOTE Date of Admission:  01/18/2016     ID: Katherine Wood is a 75 y.o. female with abd abscesses, pseudomonas and VRE  Active Problems:   Bowel perforation (HCC)   Hernia of abdominal cavity   Diverticulitis of colon with perforation   Protein-calorie malnutrition, severe   Abscess of abdominal cavity (HCC)  Subjective: Remains on meropenem and dapto  Low grade fever 100.2   Husband at bedside  ROS  Unable to obtain Medications:  Antibiotics Given (last 72 hours)    Date/Time Action Medication Dose Rate   02/08/16 1737 Given   DAPTOmycin (CUBICIN) 500 mg in sodium chloride 0.9 % IVPB 500 mg 220 mL/hr   02/08/16 2235 Given   metroNIDAZOLE (FLAGYL) IVPB 500 mg 500 mg 100 mL/hr   02/09/16 0433 Given   metroNIDAZOLE (FLAGYL) IVPB 500 mg 500 mg 100 mL/hr   02/09/16 0836 Given   ertapenem (INVANZ) 1 g in sodium chloride 0.9 % 50 mL IVPB 1 g 100 mL/hr   02/09/16 1151 Given   metroNIDAZOLE (FLAGYL) IVPB 500 mg 500 mg 100 mL/hr   02/09/16 1817 Given   meropenem (MERREM) 1 g in sodium chloride 0.9 % 100 mL IVPB 1 g 200 mL/hr   02/09/16 1854 Given   DAPTOmycin (CUBICIN) 500 mg in sodium chloride 0.9 % IVPB 500 mg 220 mL/hr   02/10/16 0536 Given   meropenem (MERREM) 1 g in sodium chloride 0.9 % 100 mL IVPB 1 g 200 mL/hr   02/10/16 1355 Given   meropenem (MERREM) 1 g in sodium chloride 0.9 % 100 mL IVPB 1 g 200 mL/hr   02/10/16 1825 Given   DAPTOmycin (CUBICIN) 500 mg in sodium chloride 0.9 % IVPB 500 mg 220 mL/hr   02/10/16 2120 Given   meropenem (MERREM) 1 g in sodium chloride 0.9 % 100 mL IVPB 1 g 200 mL/hr   02/11/16 0526 Given   meropenem (MERREM) 1 g in sodium chloride 0.9 % 100 mL IVPB 1 g 200 mL/hr     . sodium chloride   Intravenous Once  . antiseptic oral rinse  7 mL Mouth Rinse QID  . chlorhexidine gluconate (SAGE KIT)  15 mL Mouth Rinse BID  . DAPTOmycin (CUBICIN)  IV  500 mg Intravenous Q24H  . donepezil  10 mg Oral  QHS  . enoxaparin (LOVENOX) injection  40 mg Subcutaneous QHS  . feeding supplement (ENSURE ENLIVE)  237 mL Oral TID  . insulin aspart  0-9 Units Subcutaneous 6 times per day  . meropenem (MERREM) IV  1 g Intravenous 3 times per day  . metoprolol tartrate  12.5 mg Oral BID  . pantoprazole (PROTONIX) IV  40 mg Intravenous Q12H  . sodium chloride flush  10-40 mL Intracatheter Q12H    Objective: Vital signs in last 24 hours: Temp:  [97.5 F (36.4 C)-100.2 F (37.9 C)] 100.2 F (37.9 C) (04/19 0826) Pulse Rate:  [94-119] 119 (04/19 0826) Resp:  [18-20] 19 (04/19 0826) BP: (111-119)/(62-70) 113/70 mmHg (04/19 0826) SpO2:  [97 %-98 %] 98 % (04/19 0826) Weight:  [52.935 kg (116 lb 11.2 oz)] 52.935 kg (116 lb 11.2 oz) (04/18 1343) Constitutional: Frail, dementia HENT: [place]/AT, PERRLA, no scleral icterus Mouth/Throat: Oropharynx is clear and moist. No oropharyngeal exudate.  Cardiovascular: Normal rate, regular rhythm and normal heart sounds. Pulmonary/Chest: Effort normal and breath sounds normal. No respiratory distress. has no wheezes.  Neck = supple, no nuchal rigidity Abdominal: Soft. Incision dehisced  is quite clean,  has multple drains, one labeled fistula, has colostomy with soft stools. Lymphadenopathy: no cervical adenopathy. No axillary adenopathy Neurological: dementia Skin: Skin is warm and dry. No rash noted. No erythema.   Lab Results  Recent Labs  02/10/16 0603 02/11/16 0527  WBC 11.3*  --   HGB 10.0*  --   HCT 29.4*  --   NA 136 138  K 3.3* 3.7  CL 106 105  CO2 26 27  BUN 21* 21*  CREATININE 0.67 0.65    Microbiology: Results for orders placed or performed during the hospital encounter of 01/18/16  MRSA PCR Screening     Status: None   Collection Time: 01/19/16  3:02 AM  Result Value Ref Range Status   MRSA by PCR NEGATIVE NEGATIVE Final    Comment:        The GeneXpert MRSA Assay (FDA approved for NASAL specimens only), is one component of  a comprehensive MRSA colonization surveillance program. It is not intended to diagnose MRSA infection nor to guide or monitor treatment for MRSA infections.   Culture, routine-abscess     Status: None   Collection Time: 01/27/16  2:05 PM  Result Value Ref Range Status   Specimen Description ABSCESS  Final   Special Requests Normal  Final   Gram Stain   Final    MODERATE WBC SEEN MANY GRAM POSITIVE COCCI IN PAIRS MODERATE GRAM NEGATIVE RODS    Culture   Final    HEAVY GROWTH VANCOMYCIN RESISTANT ENTEROCOCCUS MODERATE GROWTH PSEUDOMONAS AERUGINOSA REFER TO OTHER ABSCESS CULTURE FOR SUSCEPTIBILITIES    Report Status 01/31/2016 FINAL  Final   Organism ID, Bacteria PSEUDOMONAS AERUGINOSA  Final      Susceptibility   Pseudomonas aeruginosa - MIC*    CEFTAZIDIME <=1 SENSITIVE Sensitive     CIPROFLOXACIN <=0.25 SENSITIVE Sensitive     GENTAMICIN <=1 SENSITIVE Sensitive     IMIPENEM 1 SENSITIVE Sensitive     CEFEPIME <=1 SENSITIVE Sensitive     * MODERATE GROWTH PSEUDOMONAS AERUGINOSA  Culture, routine-abscess     Status: None   Collection Time: 01/27/16  2:25 PM  Result Value Ref Range Status   Specimen Description ABSCESS  Final   Special Requests Normal  Final   Gram Stain   Final    MODERATE WBC SEEN MANY GRAM POSITIVE COCCI IN PAIRS MANY GRAM NEGATIVE RODS    Culture   Final    HEAVY GROWTH VANCOMYCIN RESISTANT ENTEROCOCCUS MODERATE GROWTH PSEUDOMONAS AERUGINOSA VRE HAVE INTRINSIC RESISTANCE TO MOST COMMONLY USED ANTIBIOTICS AND THE ABILITY TO ACQUIRE RESISTANCE TO MOST AVAILABLE ANTIBIOTICS.    Report Status 01/31/2016 FINAL  Final   Organism ID, Bacteria PSEUDOMONAS AERUGINOSA  Final   Organism ID, Bacteria VANCOMYCIN RESISTANT ENTEROCOCCUS  Final      Susceptibility   Pseudomonas aeruginosa - MIC*    CEFTAZIDIME <=1 SENSITIVE Sensitive     CIPROFLOXACIN <=0.25 SENSITIVE Sensitive     GENTAMICIN <=1 SENSITIVE Sensitive     IMIPENEM 1 SENSITIVE Sensitive      CEFEPIME <=1 SENSITIVE Sensitive     * MODERATE GROWTH PSEUDOMONAS AERUGINOSA   Vancomycin resistant enterococcus - MIC*    AMPICILLIN >=32 RESISTANT Resistant     VANCOMYCIN >=32 RESISTANT Resistant     GENTAMICIN SYNERGY SENSITIVE Sensitive     LINEZOLID Value in next row Sensitive      SENSITIVE2    * HEAVY GROWTH VANCOMYCIN RESISTANT ENTEROCOCCUS  C difficile quick scan w PCR  reflex     Status: None   Collection Time: 02/01/16 10:55 AM  Result Value Ref Range Status   C Diff antigen NEGATIVE NEGATIVE Final   C Diff toxin NEGATIVE NEGATIVE Final   C Diff interpretation Negative for C. difficile  Final  Anaerobic culture     Status: None   Collection Time: 02/01/16 11:56 AM  Result Value Ref Range Status   Specimen Description WOUND  Final   Special Requests NONE  Final   Culture NO ANAEROBES ISOLATED  Final   Report Status 02/05/2016 FINAL  Final  Culture, routine-abscess     Status: None   Collection Time: 02/01/16 11:56 AM  Result Value Ref Range Status   Specimen Description WOUND  Final   Special Requests NONE  Final   Gram Stain MANY WBC SEEN FEW GRAM NEGATIVE RODS   Final   Culture MODERATE GROWTH PSEUDOMONAS AERUGINOSA  Final   Report Status 02/04/2016 FINAL  Final   Organism ID, Bacteria PSEUDOMONAS AERUGINOSA  Final      Susceptibility   Pseudomonas aeruginosa - MIC*    CEFTAZIDIME 4 SENSITIVE Sensitive     CIPROFLOXACIN <=0.25 SENSITIVE Sensitive     GENTAMICIN <=1 SENSITIVE Sensitive     IMIPENEM 1 SENSITIVE Sensitive     CEFEPIME 2 SENSITIVE Sensitive     PIP/TAZO Value in next row Sensitive      SENSITIVE8    AMPICILLIN/SULBACTAM Value in next row Resistant      RESISTANT>=32    * MODERATE GROWTH PSEUDOMONAS AERUGINOSA    Studies/Results:  Dg Chest 1 View  01/19/2016  CLINICAL DATA:  Endotracheal tube placement.  Initial encounter. EXAM: CHEST 1 VIEW COMPARISON:  None. FINDINGS: The patient's endotracheal tube is seen ending 3-4 cm above the carina.  An enteric tube is noted extending below the diaphragm. The lungs are well-aerated. Peribronchial thickening is noted. Minimal bibasilar atelectasis is noted. There is no evidence of pleural effusion or pneumothorax. The cardiomediastinal silhouette is within normal limits. No acute osseous abnormalities are seen. IMPRESSION: 1. Endotracheal tube seen ending 3-4 cm above the carina. 2. Peribronchial thickening noted. Minimal bibasilar atelectasis noted. Electronically Signed   By: Garald Balding M.D.   On: 01/19/2016 01:55   Dg Abd 1 View  02/01/2016  CLINICAL DATA:  Check nasogastric catheter placement EXAM: ABDOMEN - 1 VIEW COMPARISON:  01/31/2016. FINDINGS: Multiple surgical and percutaneous drains are identified similar to that seen on recent exams. No nasogastric catheter is noted at this time. If one has been placed it likely lies in the more proximal esophagus. No other focal abnormality is noted. IMPRESSION: Nasogastric catheter not visualized. Multiple surgical and percutaneous drains are again seen. Electronically Signed   By: Inez Catalina M.D.   On: 02/01/2016 21:04   Ct Abdomen Pelvis W Contrast  02/10/2016  CLINICAL DATA:  Followup abdominal abscesses. EXAM: CT ABDOMEN AND PELVIS WITH CONTRAST TECHNIQUE: Multidetector CT imaging of the abdomen and pelvis was performed using the standard protocol following bolus administration of intravenous contrast. CONTRAST:  63m ISOVUE-300 IOPAMIDOL (ISOVUE-300) INJECTION 61% COMPARISON:  01/31/2016 FINDINGS: Lower chest: Dependent changes and pleural thickening overlying the posterior right lower lobe noted. No pleural effusion identified. Hepatobiliary: Cyst overlying the lateral segment of left lobe of liver noted, image 20 of series 2. Subhepatic abscess is identified along the undersurface of the right lobe of liver measuring 5 x 2.3 x 4.6 cm. This is more well defined than on the previous exam. Gallbladder appears  normal. No biliary dilatation. Pancreas:  Normal appearance of the pancreas. Spleen: No focal splenic abnormality. Small fluid collection along the superior margin of the spleen measures 1.9 x 3.2 cm, image 17 of series 2. Previously this measured 2.8 x 4.3 cm. Adrenals/Urinary Tract: The adrenal glands are negative. Bilateral renal sinus cysts are noted. Urinary bladder appears normal. Stomach/Bowel: Fluid collection along the greater curvature the stomach measures 3.4 x 1.8 by 3.0 cm. Previously this measured 6.6 x 4.7 x 7.5 cm. No pathologic dilatation of the large or small bowel loops. There is a left lower quadrant colostomy. Vascular/Lymphatic: Aortic atherosclerosis noted. No upper abdominal adenopathy. No pelvic or inguinal adenopathy. Reproductive: Previous hysterectomy. Complex fluid collection within the right side of pelvis is again identified and is favored to represent hydrosalpinx. This measures 5.4 x 3.7 cm, image 71 of series 2 Other: Left lower quadrant abscess measures 1.5 x 1.8 cm, image 33 of series 2. Previously 2.4 x 2.7 cm. There is a JP drain within the upper abdomen. This does not appear to be situated in any specific fluid collection. Additionally, there is a right lower quadrant percutaneous pigtail drainage catheter, image 51 of series 2. This does not appear to be situated in any specific fluid collection either. Musculoskeletal: No aggressive lytic or sclerotic bone lesions identified. IMPRESSION: 1. There has been interval decrease in volume of the perisplenic, perigastric fluid collections and left lower quadrant fluid collections. 2. Subhepatic abscess along the inferior right lobe of liver appears more well-defined than on the previous exam. 3. Suspected right-sided hydrosalpinx. 4. There are 2 percutaneous drainage catheters within the abdomen. At this time, these do not appear to be situated in any particular fluid collection. Electronically Signed   By: Kerby Moors M.D.   On: 02/10/2016 10:33   Ct Abdomen Pelvis W  Contrast  01/31/2016  CLINICAL DATA:  Interventional abscess questionable fistula status post drainage. She was doing okay no complication. Gram-positive out from the drain as well as Pseudomonas. Started on vancomycin and Zosyn EXAM: CT ABDOMEN AND PELVIS WITH CONTRAST TECHNIQUE: Multidetector CT imaging of the abdomen and pelvis was performed using the standard protocol following bolus administration of intravenous contrast. CONTRAST:  172m ISOVUE-300 IOPAMIDOL (ISOVUE-300) INJECTION 61% COMPARISON:  01/25/2016 FINDINGS: Lung bases: Moderate bilateral pleural effusions. Dependent atelectasis mostly in the lower lobes. Heart mildly enlarged. Findings are similar to the prior exam. Peritoneal cavity: There are residual collections. The larger collection noted adjacent to the posterior medial aspect of the liver and right pericolic gutter has been mostly evacuated following percutaneous drainage with a pigtail catheter. Catheter tip lies adjacent to the inferior margin of the liver. There is a small amount of fluid with small bubbles of air along the posterior medial aspect of the liver reflecting a residual collection. Fluid collection along the greater curvature of the stomach has mildly increased in size, now measuring 7.5 x 4.7 x 6.6 cm. Small fluid collections along the left anterior para renal space are similar. Fluid collection seen anterior to the pancreas on the prior study is no longer present. There are no new abdominal or pelvic fluid collections. Inflammatory type stranding throughout the peritoneal fat is similar to the prior study. Hepatobiliary: 8 mm cyst along the anterior margin of the left lobe lateral segment. No other liver masses or lesions. Gallbladder is distended but otherwise unremarkable. No bile duct dilation. Spleen, pancreas, adrenal glands:  Unremarkable. Kidneys, ureters, bladder: There are bilateral renal cortical and renal sinus cysts,  stable. No hydronephrosis. Ureters normal in  course and in caliber. Bladder is unremarkable. Uterus and adnexa: Uterus surgically absent. Stable right adnexal cyst, likely ovarian, measuring 4.2 cm. Lymph nodes:  No pathologically enlarged lymph nodes. Gastrointestinal: Inferior sigmoid colon terminates in a short Hartman's pouch. Left colon is diverted into a left mid abdomen colostomy. No evidence of bowel obstruction. No convincing bowel inflammation or ischemia. Minimal free air is seen in the anterior peritoneal cavity of the upper abdomen. This was present on the prior exam and has decreased in amount. Abdominal wall: Persistent soft tissue defect along the anterior abdominal midline with intact fascia. Drainage catheter lies in the pelvis. Under the pigtail catheter lies in the right mid abdomen. Another surgical drains catheter or extends across the upper abdomen below the lower greater curvature of the stomach. IMPRESSION: 1. There has been overall mild improvement when compared to the prior CT. 2. The larger collection noted along the right anterior para renal space and along the posterior medial aspect of the liver has been mostly evacuated following percutaneous drainage. A collections seen adjacent to the posterior mid to distal stomach is no longer present. There is a collection along the greater curvature of the stomach which is mildly increased in size from the prior study. Small fluid collections along the left anterior perirenal space are similar to the prior exam. Diffuse mesenteric inflammatory type change is also stable. Minimal free intraperitoneal air is decreased when compared the prior exam. 3. No new palpable intraperitoneal collections. 4. Persistent moderate pleural effusions with associated lower lobe atelectasis similar to the prior exam. Electronically Signed   By: Lajean Manes M.D.   On: 01/31/2016 12:06   Ct Abdomen Pelvis W Contrast  01/25/2016  CLINICAL DATA:  75 year old female with followup exploratory laparotomy for  bowel perforation. Hartmann's procedure. Continued drainage from abdominal drains. EXAM: CT ABDOMEN AND PELVIS WITH CONTRAST TECHNIQUE: Multidetector CT imaging of the abdomen and pelvis was performed using the standard protocol following bolus administration of intravenous contrast. CONTRAST:  176m ISOVUE-300 IOPAMIDOL (ISOVUE-300) INJECTION 61% COMPARISON:  01/18/2016 CT FINDINGS: Lower chest: Moderate bilateral pleural effusions and bilateral lower lung atelectasis noted. Mild cardiomegaly and coronary artery calcifications again identified. Hepatobiliary: The liver and gallbladder are unremarkable. No biliary dilatation identified. Pancreas: Unremarkable Spleen: Unremarkable Adrenals/Urinary Tract: Bilateral renal cysts and renal cortical atrophy again noted. The bladder and adrenal glands are unremarkable. Stomach/Bowel: There is no evidence of bowel obstruction. A left abdominal ostomy is noted. Vascular/Lymphatic: Aortic atherosclerotic calcifications noted without aneurysm. No enlarged lymph nodes. Reproductive: Patient is status post hysterectomy. Other: Multiple all intra-abdominal and pelvic abscesses containing fluid and gas are identified and include the following: A 4.8 x 11 x 16.8 cm abscess along the posterior inferior medial liver. A 3.6 x 7.1 cm abscess adjacent to the lateral aspect of the stomach (image 21). A 3.8 x 5.6 cm abscess in the left paramedian mid abdomen (image 37). A 3.3 x 3.5 cm abscess in the region of the lower gastrohepatic ligament (image 28 and 29). A 4 x 11.2 cm abscess within the anterior lower abdomen/ upper pelvis (image 57). This abscess has high density material its left aspect and maybe connected to bowel as this has the appearance of oral contrast. A definite connection is not located. A 3.2 x 3.7 cm posterior pelvic abscess (image 80). Small foci of pneumoperitoneum/ free air noted. Two abdominal drains are present, 1 with tip in the anterior left abdomen and other  with  tip in the high left abdomen posterior to the spleen. No collections are identified adjacent to the catheter tips. Diffuse subcutaneous edema is noted. A small right inguinal hernia containing fat is noted. Musculoskeletal: No acute abnormalities identified. IMPRESSION: Multiple intra-abdominal and intrapelvic abscesses as described, with the largest measuring 4.8 x 11 x 16.8 cm along the posterior inferior medial liver. The 4 x 11.2 cm abscess within the anterior lower abdomen/ upper pelvis as high-density material and could represent oral contrast from connection to bowel. Postoperative changes as described with small residual foci of pneumoperitoneum. Moderate bilateral pleural effusions and bilateral lower lung atelectasis. These results were discussed with Dr. Bary Castilla on 01/25/2016 at 11:15 AM. Electronically Signed   By: Margarette Canada M.D.   On: 01/25/2016 11:16   Ct Abdomen Pelvis W Contrast  01/18/2016  ADDENDUM REPORT: 01/18/2016 19:52 ADDENDUM: These results were called by telephone at the time of interpretation on 01/18/2016 at 7:51 pm to Dr. Hinda Kehr , who verbally acknowledged these results. Electronically Signed   By: Anner Crete M.D.   On: 01/18/2016 19:52  01/18/2016  CLINICAL DATA:  75 year old female with abdominal pain and diarrhea. No surgery. EXAM: CT ABDOMEN AND PELVIS WITH CONTRAST TECHNIQUE: Multidetector CT imaging of the abdomen and pelvis was performed using the standard protocol following bolus administration of intravenous contrast. CONTRAST:  55m ISOVUE-300 IOPAMIDOL (ISOVUE-300) INJECTION 61% COMPARISON:  CT dated 10/09/2007 FINDINGS: Linear bibasilar atelectasis/ scarring. There is large pneumoperitoneum. There is diffuse stranding of the mesentery and omental fat. Small ascites. Mild apparent fatty infiltration of the liver. A 1 cm hypodense lesion in the left lobe of the liver is not well characterized but likely represents a cyst or hemangioma. The gallbladder is  mildly distended. No calcified gallstone identified. The pancreas, spleen, adrenal glands appear unremarkable. There bilateral renal parapelvic cysts. Multiple smaller renal hypodense lesions are not well characterized but likely represent cysts. There is no hydronephrosis on either side. The visualized ureters and urinary bladder appear unremarkable. Hysterectomy. There is sigmoid diverticulosis with muscular hypertrophy. No active inflammatory changes. There is inflammatory changes and thickening of the body, and antrum of the stomach. Multiple loculated extraluminal air noted in the anterior abdomen and along the greater curvature of the stomach. No extraluminal extravasation of oral contrast identified. There is no evidence of bowel obstruction. The visualized appendix appears unremarkable. There is mild aortoiliac atherosclerotic disease. No portal venous gas identified. There is no adenopathy. There is a small fat containing umbilical hernia. There is mild osteopenia and degenerative changes of the spine. No acute fracture. IMPRESSION: Large amount of pneumoperitoneum most concerning for bowel perforation. The site of presumed bowel perforation is not identified with certainty on the CT. There is thickening of the body and antrum of the stomach. No extraluminal oral contrast identified. Clinical correlation and surgical consult is advised. Sigmoid diverticulosis without active inflammatory changes. Electronically Signed: By: AAnner CreteM.D. On: 01/18/2016 19:39   Ct Image Guided Drainage Percut Cath  Peritoneal Retroperit  01/27/2016  INDICATION: Status post exploratory laparotomy for colonic perforation with postoperative fluid collections EXAM: CT GUIDED DRAINAGE OF PERIHEPATIC AND MID ABDOMINAL ABSCESS MEDICATIONS: The patient is currently admitted to the hospital and receiving intravenous antibiotics. The antibiotics were administered within an appropriate time frame prior to the initiation of the  procedure. ANESTHESIA/SEDATION: None COMPLICATIONS: None immediate. TECHNIQUE: Informed written consent was obtained from the patient's husband after a thorough discussion of the procedural risks, benefits and alternatives. All questions were  addressed. Maximal Sterile Barrier Technique was utilized including caps, mask, sterile gowns, sterile gloves, sterile drape, hand hygiene and skin antiseptic. A timeout was performed prior to the initiation of the procedure. PROCEDURE: The right mid abdominal wall was prepped with Chlorhexidine in a sterile fashion, and a sterile drape was applied covering the operative field. A sterile gown and sterile gloves were used for the procedure. Local anesthesia was provided with 1% Lidocaine. Utilizing CT guidance, a 67 French pigtail catheter was placed utilizing a quick stick technique into the large anterior mid abdominal collection containing contrast material indicative of communication with the adjacent bowel loops. The catheter was sutured into place and placed to external suction drainage. Utilizing a similar technique, a second 76 French pigtail catheter was placed in the perihepatic fluid collection. This also demonstrates some increased density within consistent with contrast from communication with adjacent bowel loops. The catheter was affixed at the skin surface and placed to external suction drainage. Approximately 550 mL of brownish liquid likely related to bowel contents was aspirated. Samples were sent for laboratory evaluation. The patient tolerated the procedure well and was returned to her room in satisfactory condition. FINDINGS: Both fluid collections demonstrated increased amount of air as well as increasing opacification with orally administered contrast material consistent with communication with the adjacent bowel loops. The fluid obtained from the collections has the appearance of frank bowel contents as opposed to focal pockets of purulent material.  IMPRESSION: Successful placement of two 14 French drainage catheters as described above. The collections drained were nearly completely decompressed. Contrast was noted in both collections which is a slight change from the recent CT examination 0 01/24/13 consistent with communication with adjacent bowel loops. Electronically Signed   By: Inez Catalina M.D.   On: 01/27/2016 15:33   Assessment:  SHATONYA PASSON is a 75 y.o. female With complicated post op course after perforated diverticulitis with multiple abscesses, dehisced incision and fistula. 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 Will follow with you.  Katherine Wood   02/11/2016, 12:25 PM

## 2016-02-11 NOTE — Care Management Important Message (Signed)
Important Message  Patient Details  Name: Rueben BashRosemarie Z Ndiaye MRN: [id number] Date of Birth: 04-Nov-1940   Medicare Important Message Given:  Yes    Chapman FitchBOWEN, Tiras Bianchini T, RN 02/11/2016, 10:22 AM

## 2016-02-11 NOTE — Progress Notes (Signed)
10 Days Post-Op   Subjective:  Patient had an uneventful night. She was seen earlier today and this afternoon. She had been being evaluated for LTAC and was denied. She has been able to increase her oral intake and is now tolerating a soft diet. She denies any abdominal discomfort.  Vital signs in last 24 hours: Temp:  [98.3 F (36.8 C)-100.2 F (37.9 C)] 100.2 F (37.9 C) (04/19 0826) Pulse Rate:  [118-119] 119 (04/19 0826) Resp:  [19-20] 19 (04/19 0826) BP: (111-113)/(62-70) 113/70 mmHg (04/19 0826) SpO2:  [98 %] 98 % (04/19 0826) Last BM Date: 02/10/16  Intake/Output from previous day: 04/18 0701 - 04/19 0700 In: 3469 [P.O.:220; IV Piggyback:699; TPN:2550] Out: 2562 [Urine:1750; Drains:12; Stool:800]  GI: Abdomen is soft, minimal incisional tenderness to deep palpation, JPs in place to the right abdomen with minimal output. Ostomy present left lower quadrant functioning well.  Lab Results:  CBC  Recent Labs  02/10/16 0603  WBC 11.3*  HGB 10.0*  HCT 29.4*  PLT 400   CMP     Component Value Date/Time   NA 138 02/11/2016 0527   NA 144 09/11/2013   K 3.7 02/11/2016 0527   CL 105 02/11/2016 0527   CO2 27 02/11/2016 0527   GLUCOSE 120* 02/11/2016 0527   BUN 21* 02/11/2016 0527   BUN 22* 09/11/2013   CREATININE 0.65 02/11/2016 0527   CALCIUM 7.9* 02/11/2016 0527   PROT 6.5 02/04/2016 1530   ALBUMIN 2.1* 02/04/2016 1530   AST 21 02/04/2016 1530   ALT 13* 02/04/2016 1530   ALKPHOS 93 02/04/2016 1530   BILITOT 0.6 02/04/2016 1530   GFRNONAA >60 02/11/2016 0527   GFRAA >60 02/11/2016 0527   PT/INR No results for input(s): LABPROT, INR in the last 72 hours.  Studies/Results: Ct Abdomen Pelvis W Contrast  02/10/2016  CLINICAL DATA:  Followup abdominal abscesses. EXAM: CT ABDOMEN AND PELVIS WITH CONTRAST TECHNIQUE: Multidetector CT imaging of the abdomen and pelvis was performed using the standard protocol following bolus administration of intravenous contrast.  CONTRAST:  85mL ISOVUE-300 IOPAMIDOL (ISOVUE-300) INJECTION 61% COMPARISON:  01/31/2016 FINDINGS: Lower chest: Dependent changes and pleural thickening overlying the posterior right lower lobe noted. No pleural effusion identified. Hepatobiliary: Cyst overlying the lateral segment of left lobe of liver noted, image 20 of series 2. Subhepatic abscess is identified along the undersurface of the right lobe of liver measuring 5 x 2.3 x 4.6 cm. This is more well defined than on the previous exam. Gallbladder appears normal. No biliary dilatation. Pancreas: Normal appearance of the pancreas. Spleen: No focal splenic abnormality. Small fluid collection along the superior margin of the spleen measures 1.9 x 3.2 cm, image 17 of series 2. Previously this measured 2.8 x 4.3 cm. Adrenals/Urinary Tract: The adrenal glands are negative. Bilateral renal sinus cysts are noted. Urinary bladder appears normal. Stomach/Bowel: Fluid collection along the greater curvature the stomach measures 3.4 x 1.8 by 3.0 cm. Previously this measured 6.6 x 4.7 x 7.5 cm. No pathologic dilatation of the large or small bowel loops. There is a left lower quadrant colostomy. Vascular/Lymphatic: Aortic atherosclerosis noted. No upper abdominal adenopathy. No pelvic or inguinal adenopathy. Reproductive: Previous hysterectomy. Complex fluid collection within the right side of pelvis is again identified and is favored to represent hydrosalpinx. This measures 5.4 x 3.7 cm, image 71 of series 2 Other: Left lower quadrant abscess measures 1.5 x 1.8 cm, image 33 of series 2. Previously 2.4 x 2.7 cm. There is a JP  drain within the upper abdomen. This does not appear to be situated in any specific fluid collection. Additionally, there is a right lower quadrant percutaneous pigtail drainage catheter, image 51 of series 2. This does not appear to be situated in any specific fluid collection either. Musculoskeletal: No aggressive lytic or sclerotic bone lesions  identified. IMPRESSION: 1. There has been interval decrease in volume of the perisplenic, perigastric fluid collections and left lower quadrant fluid collections. 2. Subhepatic abscess along the inferior right lobe of liver appears more well-defined than on the previous exam. 3. Suspected right-sided hydrosalpinx. 4. There are 2 percutaneous drainage catheters within the abdomen. At this time, these do not appear to be situated in any particular fluid collection. Electronically Signed   By: Signa Kell M.D.   On: 02/10/2016 10:33    Assessment/Plan: Patient continues to improve. Appreciate internal medicine and infectious disease assistance with this complicated patient. Patient was denied by LTAC so she will need to remain an inpatient until she qualifies for skilled nursing facility. Encourage ambulation and oral intake. If she can increase her oral intake she can be weaned off the TPN. Antibiotic coverage for infections per infectious disease. Tachycardia management per internal medicine.   Ricarda Frame, MD FACS General Surgeon  02/11/2016

## 2016-02-11 NOTE — Consult Note (Signed)
PARENTERAL NUTRITION CONSULT NOTE - FOLLOW UP   Pharmacy Consult for electrolytes/glucose Indication: TPN  Allergies  Allergen Reactions  . Galantamine Other (See Comments)    Patient Measurements: Height: 5\' 7"  (170.2 cm) Weight: 116 lb 11.2 oz (52.935 kg) IBW/kg (Calculated) : 61.6  Labs:  Recent Labs  02/10/16 0603  WBC 11.3*  HGB 10.0*  HCT 29.4*  PLT 400     Recent Labs  02/09/16 0751 02/10/16 0603 02/11/16 0527  NA 136 136 138  K 3.5 3.3* 3.7  CL 105 106 105  CO2 27 26 27   GLUCOSE 149* 131* 120*  BUN 18 21* 21*  CREATININE 0.53 0.67 0.65  CALCIUM 7.9* 7.7* 7.9*  MG 1.7 1.8 1.7  PHOS 3.5  --  3.2   Estimated Creatinine Clearance: 52.3 mL/min (by C-G formula based on Cr of 0.65).    Recent Labs  02/10/16 2023 02/11/16 0023 02/11/16 0419  GLUCAP 127* 128* 141*   Insulin Requirements in the past 24 hours:  5 units (SSI q4h)  Current Nutrition:  Clinimix E 5/20 started 01/28/16, now at 60 ml/hr Clear liquids 4/13  Assessment: All electrolytes wnl  Plan:  Will follow up electrolytes with am labs.   Pharmacy will continue to monitor and adjust per consult.   Demetrius Charityeldrin D. Essica Kiker, PharmD  Clinical Pharmacist  02/11/2016 7:30 AM

## 2016-02-12 LAB — BASIC METABOLIC PANEL
ANION GAP: 5 (ref 5–15)
BUN: 25 mg/dL — AB (ref 6–20)
CALCIUM: 8 mg/dL — AB (ref 8.9–10.3)
CO2: 28 mmol/L (ref 22–32)
CREATININE: 0.64 mg/dL (ref 0.44–1.00)
Chloride: 104 mmol/L (ref 101–111)
GFR calc Af Amer: >60 mL/min (ref >60)
GLUCOSE: 116 mg/dL — AB (ref 65–99)
Potassium: 4.2 mmol/L (ref 3.5–5.1)
Sodium: 137 mmol/L (ref 135–145)

## 2016-02-12 LAB — ECHOCARDIOGRAM COMPLETE
Height: 67 in
Weight: 1867.2 oz

## 2016-02-12 LAB — GLUCOSE, CAPILLARY
Glucose-Capillary: 129 mg/dL — ABNORMAL HIGH (ref 65–99)
Glucose-Capillary: 120 mg/dL — ABNORMAL HIGH (ref 65–99)
Glucose-Capillary: 90 mg/dL (ref 65–99)
Glucose-Capillary: 104 mg/dL — ABNORMAL HIGH (ref 65–99)
Glucose-Capillary: 113 mg/dL — ABNORMAL HIGH (ref 65–99)

## 2016-02-12 LAB — MAGNESIUM: Magnesium: 1.8 mg/dL (ref 1.7–2.4)

## 2016-02-12 MED ORDER — SODIUM CHLORIDE 0.9 % IV SOLN
INTRAVENOUS | Status: AC
  Administered 2016-02-12 – 2016-02-13 (×2): via INTRAVENOUS

## 2016-02-12 MED ORDER — FAT EMULSION 20 % IV EMUL
500.0000 mL | INTRAVENOUS | Status: AC
Start: 2016-02-12 — End: 2016-02-13
  Administered 2016-02-12: 500 mL via INTRAVENOUS
  Filled 2016-02-12: qty 500

## 2016-02-12 MED ORDER — TRACE MINERALS CR-CU-MN-SE-ZN 10-1000-500-60 MCG/ML IV SOLN
INTRAVENOUS | Status: AC
  Administered 2016-02-12: 20:00:00 via INTRAVENOUS
  Filled 2016-02-12: qty 1440

## 2016-02-12 NOTE — Consult Note (Signed)
PARENTERAL NUTRITION CONSULT NOTE - FOLLOW UP   Pharmacy Consult for electrolytes/glucose Indication: TPN  Allergies  Allergen Reactions  . Galantamine Other (See Comments)   Patient Measurements: Height: 5\' 7"  (170.2 cm) Weight: 114 lb 10.2 oz (52 kg) IBW/kg (Calculated) : 61.6  Labs:  Recent Labs  02/10/16 0603  WBC 11.3*  HGB 10.0*  HCT 29.4*  PLT 400    Recent Labs  02/09/16 0751 02/10/16 0603 02/11/16 0527 02/12/16 0528  NA 136 136 138 137  K 3.5 3.3* 3.7 4.2  CL 105 106 105 104  CO2 27 26 27 28   GLUCOSE 149* 131* 120* 116*  BUN 18 21* 21* 25*  CREATININE 0.53 0.67 0.65 0.64  CALCIUM 7.9* 7.7* 7.9* 8.0*  MG 1.7 1.8 1.7 1.8  PHOS 3.5  --  3.2  --    Estimated Creatinine Clearance: 51.4 mL/min (by C-G formula based on Cr of 0.64).    Recent Labs  02/11/16 2358 02/12/16 0408 02/12/16 0735  GLUCAP 123* 129* 120*   Insulin Requirements in the past 24 hours:  5 units (SSI q4h)  Current Nutrition:  Clinimix E 5/20 started 01/28/16, now at 60 ml/hr Clear liquids 4/13  Assessment: All electrolytes and BG are WNL this morning.  Plan:  Will check electrolytes with AM labs tomorrow.   Pharmacy will continue to monitor and adjust per consult.   Keturah BarreMary Ramadan Couey, PharmD Clinical Pharmacist 02/12/2016 7:48 AM

## 2016-02-12 NOTE — Progress Notes (Signed)
[facility] [place] [facility]   PATIENT NAME: Katherine PollockRosemarie Wood    MR#:  [id number]  DATE OF BIRTH:  [birth year]  SUBJECTIVE:  CHIEF COMPLAINT:   Chief Complaint  Patient presents with  . Abdominal Pain   Weakness and anxiety. REVIEW OF SYSTEMS:  CONSTITUTIONAL: No fever, has poor oral intake and weakness.  EYES: No blurred or double vision.  EARS, NOSE, AND THROAT: No tinnitus or ear pain.  RESPIRATORY: No cough, shortness of breath, wheezing or hemoptysis.  CARDIOVASCULAR: No chest pain, orthopnea, edema.  GASTROINTESTINAL: No nausea, vomiting, diarrhea or abdominal pain.  GENITOURINARY: No dysuria, hematuria.  ENDOCRINE: No polyuria, nocturia,  HEMATOLOGY: No anemia, easy bruising or bleeding SKIN: No rash or lesion. MUSCULOSKELETAL: No joint pain or arthritis.   NEUROLOGIC: No tingling, numbness, weakness.  PSYCHIATRY: has anxiety but no depression.   DRUG ALLERGIES:   Allergies  Allergen Reactions  . Galantamine Other (See Comments)    VITALS:  Blood pressure 108/65, pulse 122, temperature 99.9 F (37.7 C), temperature source Oral, resp. rate 18, height 5\' 7"  (1.702 m), weight 52 kg (114 lb 10.2 oz), SpO2 99 %.  PHYSICAL EXAMINATION:  GENERAL:  75 y.o.-year-old patient lying in the bed with no acute distress.  EYES: Pupils equal, round, reactive to light and accommodation. No scleral icterus. Extraocular muscles intact.  HEENT: Head atraumatic, normocephalic. Oropharynx and nasopharynx clear.  NECK:  Supple, no jugular venous distention. No thyroid enlargement, no tenderness.  LUNGS: Normal breath sounds bilaterally, no wheezing, rales,rhonchi or crepitation. No use of accessory muscles of respiration.  CARDIOVASCULAR: S1, S2 normal. No murmurs, rubs, or gallops.  ABDOMEN: Soft, nontender, nondistended. Bowel sounds present. No organomegaly or mass.  EXTREMITIES: No pedal edema, cyanosis, or clubbing.  NEUROLOGIC: Cranial nerves II  through XII are intact. Muscle strength 4/5 in all extremities. Sensation intact. Gait not checked.  PSYCHIATRIC: The patient is alert and oriented x 3.  SKIN: No obvious rash, lesion, or ulcer.    LABORATORY PANEL:   CBC  Recent Labs Lab 02/10/16 0603  WBC 11.3*  HGB 10.0*  HCT 29.4*  PLT 400   ------------------------------------------------------------------------------------------------------------------  Chemistries   Recent Labs Lab 02/12/16 0528  NA 137  K 4.2  CL 104  CO2 28  GLUCOSE 116*  BUN 25*  CREATININE 0.64  CALCIUM 8.0*  MG 1.8   ------------------------------------------------------------------------------------------------------------------  Cardiac Enzymes No results for input(s): TROPONINI in the last 168 hours. ------------------------------------------------------------------------------------------------------------------  RADIOLOGY:  No results found.  EKG:   Orders placed or performed during the hospital encounter of 01/18/16  . EKG 12-Lead  . EKG 12-Lead  . ED EKG  . ED EKG    ASSESSMENT AND PLAN:   * SVT  Likely due to anxiety and dehydratioin. Echo: LV EF: 60% - 65%   * Dehydration. Start NS IV, f/u BMP.  * Anxiety  Xanax prn.  * perforated bowel and infection. s/p sx, ID on case, On ABx. manage per primary team.  * hypertension  Cont metoprolol, controlled.  All the records are reviewed and case discussed with Care Management/Social Workerr. Management plans discussed with the patient, family and they are in agreement.  CODE STATUS: full code.  TOTAL TIME TAKING CARE OF THIS PATIENT: 28 minutes.  Greater than 50% time was spent on coordination of care and face-to-face counseling.  POSSIBLE D/C IN ? DAYS, DEPENDING ON CLINICAL CONDITION.   Katherine Pollackhen, Katherine Wood M.D on 02/12/2016 at 3:35 PM  Between 7am to  6pm - Pager - [phone]  After 6pm go to [email or web address] - password [email or web address] [facility]  [facility]  Office  [phone]  CC: Primary care physician; Lorie Phenix, MD

## 2016-02-12 NOTE — Progress Notes (Signed)
Patient had 11 beat burst of SVT at 0043. Patient asymptomatic. Heart rhythm right back to NSR in 90s. Notified Dr Anne HahnWillis, no new orders. Continue to monitor.

## 2016-02-12 NOTE — Progress Notes (Signed)
Pharmacy Antibiotic Note  Katherine Wood is a 75 y.o. female admitted on 01/18/2016 with abdominal abscess.  Pharmacy has been consulted for daptomycin and meropenem dosing for VRE and pseudomonas.   Plan: Continue meropenem 1gm IV Q8H  Continue Daptomycin 500mg  IV q24h Will continue to check CK weekly with labs ordered on 4/22    Height: 5\' 7"  (170.2 cm) Weight: 114 lb 10.2 oz (52 kg) IBW/kg (Calculated) : 61.6  Temp (24hrs), Avg:99.5 F (37.5 C), Min:98.5 F (36.9 C), Max:100.2 F (37.9 C)   Recent Labs Lab 02/05/16 1030 02/06/16 0412  02/08/16 0607 02/09/16 0751 02/10/16 0603 02/11/16 0527 02/12/16 0528  WBC 14.7* 19.2*  --  13.3*  --  11.3*  --   --   CREATININE 0.49 0.55  < > 0.59 0.53 0.67 0.65 0.64  < > = values in this interval not displayed.  Estimated Creatinine Clearance: 51.4 mL/min (by C-G formula based on Cr of 0.64).    Allergies  Allergen Reactions  . Galantamine Other (See Comments)    Antimicrobials this admission: Ertapenem  4/2 >> 4/6 Zosyn  4/6 >> 4/11, 4/15>4/17 Flagyl 4/2>>4/17 Vancomycin 4/7 >> 4/8 Fluconazole 4/8>> Daptomycin 4/8>> Ciprofloxacin 4/11 >>4/15 Meropenem 4/17 >>  Microbiology results: 4/9 Wound Culture: P. Aeruginosa - sensitive to Cipro 4/4 Abd.Abscess CX: P. aeruginosa, VRE 3/27 MRSA PCR: negative  Pharmacy will continue to monitor and adjust per consult.    Cindi CarbonMary M Rickiya Picariello, PharmD 02/12/2016 7:43 AM

## 2016-02-12 NOTE — Clinical Social Work Note (Signed)
CSW has spoken to Dr. Michela PitcherEly who has stated that the goal is to get patient off the TPN. RN CM had spoken to patient's husband via phone this afternoon with CSW present and patient's husband has stated that he is concerned about taking her home and managing the wound she has so he would be interested in patient going to [facility] at discharge.  York SpanielMonica Ariyel Wood MSW,LCSW [phone]

## 2016-02-12 NOTE — Progress Notes (Signed)
11 Days Post-Op   Subjective:  She continues to slowly improve. She's not had any significant symptoms over the course of the evening. She remains pleasantly confused. She is taking some soft diet but certainly not at a rate we be comfortable with for long periods of time. Her drain does not appear to communicate with any of the abscesses so it will be removed.  Vital signs in last 24 hours: Temp:  [98.5 F (36.9 C)-99.9 F (37.7 C)] 99.9 F (37.7 C) (04/20 0429) Pulse Rate:  [106-122] 122 (04/20 1226) Resp:  [17-18] 18 (04/20 1226) BP: (101-121)/(55-65) 108/65 mmHg (04/20 1226) SpO2:  [97 %-99 %] 99 % (04/20 1226) Weight:  [52 kg (114 lb 10.2 oz)] 52 kg (114 lb 10.2 oz) (04/20 0434) Last BM Date: 02/10/16  Intake/Output from previous day: 04/19 0701 - 04/20 0700 In: 2414 [P.O.:280; IV Piggyback:306; TPN:1828] Out: 2623 [Urine:2575; Drains:13; Stool:35]  GI: Her JP drain was removed without difficulty. The pigtail catheter remains in place.  Lab Results:  CBC  Recent Labs  02/10/16 0603  WBC 11.3*  HGB 10.0*  HCT 29.4*  PLT 400   CMP     Component Value Date/Time   NA 137 02/12/2016 0528   NA 144 09/11/2013   K 4.2 02/12/2016 0528   CL 104 02/12/2016 0528   CO2 28 02/12/2016 0528   GLUCOSE 116* 02/12/2016 0528   BUN 25* 02/12/2016 0528   BUN 22* 09/11/2013   CREATININE 0.64 02/12/2016 0528   CALCIUM 8.0* 02/12/2016 0528   PROT 6.5 02/04/2016 1530   ALBUMIN 2.1* 02/04/2016 1530   AST 21 02/04/2016 1530   ALT 13* 02/04/2016 1530   ALKPHOS 93 02/04/2016 1530   BILITOT 0.6 02/04/2016 1530   GFRNONAA >60 02/12/2016 0528   GFRAA >60 02/12/2016 0528   PT/INR No results for input(s): LABPROT, INR in the last 72 hours.  Studies/Results: No results found.  Assessment/Plan: She slowly improving. I don't see any evidence of advancing infection. She does have good ostomy function. She does appear to have some stool or feculent drainage in the pigtail catheter. Our  goal will be to keep her on antibiotics control her fistula and get her off TPN at all possible.

## 2016-02-12 NOTE — Consult Note (Signed)
[facility] - [place] at [facility]   PATIENT NAME: Katherine Wood    MR#:  [id number]  DATE OF BIRTH:  [birth year]  DATE OF ADMISSION:  01/18/2016  PRIMARY CARE PHYSICIAN: Lorie Phenix, MD   REQUESTING/REFERRING PHYSICIAN: Michela Pitcher  CHIEF COMPLAINT:  Tachycardia.  Chief Complaint  Patient presents with  . Abdominal Pain    HISTORY OF PRESENT ILLNESS: Katherine Wood  is a 75 y.o. female with a known history of hyperlipidemia, anxiety- admitted for perforated bowel, and s/p sx, on ABX. Had SVT today on tele, so medical consult called in. Pt denies any chest pain, SOB. She is very anxious today, and did not sleep as per sitter in room.  Not able to give much history due to her dementia.  PAST MEDICAL HISTORY:   Past Medical History  Diagnosis Date  . Allergy   . Depression   . Hyperlipidemia   . Anxiety   . Osteoporosis     PAST SURGICAL HISTORY: Past Surgical History  Procedure Laterality Date  . Cataract extraction Bilateral 08/2012    [facility]- Dr. Jettie Pagan  . Abdominal hysterectomy  1989  . Laparotomy N/A 01/18/2016    Procedure: EXPLORATORY LAPAROTOMY, LYSIS OF ADHESIONS, DRAINAGE OF INTRABDOMINAL ABSCESS, HARTMAN'S PROCEDURE, UMBILICAL HERNIA REPAIR ;  Surgeon: Leafy Ro, MD;  Location: [facility] ORS;  Service: General;  Laterality: N/A;  . Laparoscopy N/A 02/01/2016    Procedure: Attempted laparoscopic,;  Surgeon: Leafy Ro, MD;  Location: [facility] ORS;  Service: General;  Laterality: N/A;  . Incision and drainage abscess N/A 02/01/2016    Procedure: INCISION AND DRAINAGE ABSCESS-OPEN;  Surgeon: Leafy Ro, MD;  Location: [facility] ORS;  Service: General;  Laterality: N/A;  . Laparotomy N/A 02/01/2016    Procedure: EXPLORATORY LAPAROTOMY , drainage of abscess, lysis of ahesions;  Surgeon: Leafy Ro, MD;  Location: [facility] ORS;  Service: General;  Laterality: N/A;    SOCIAL HISTORY:  Social History  Substance Use Topics  . Smoking status:  Never Smoker   . Smokeless tobacco: Never Used  . Alcohol Use: No    FAMILY HISTORY:  Family History  Problem Relation Age of Onset  . Hypertension Mother   . Parkinsonism Mother   . Heart disease Father   . Coronary artery disease Brother   . Heart attack Brother     DRUG ALLERGIES:  Allergies  Allergen Reactions  . Galantamine Other (See Comments)    REVIEW OF SYSTEMS:   CONSTITUTIONAL: No fever, fatigue or weakness.  EYES: No blurred or double vision.  EARS, NOSE, AND THROAT: No tinnitus or ear pain.  RESPIRATORY: No cough, shortness of breath, wheezing or hemoptysis.  CARDIOVASCULAR: No chest pain, orthopnea, edema.  GASTROINTESTINAL: No nausea, vomiting, diarrhea or abdominal pain.  GENITOURINARY: No dysuria, hematuria.  ENDOCRINE: No polyuria, nocturia,  HEMATOLOGY: No anemia, easy bruising or bleeding SKIN: No rash or lesion. MUSCULOSKELETAL: No joint pain or arthritis.   NEUROLOGIC: No tingling, numbness, weakness.  PSYCHIATRY: No anxiety or depression.   MEDICATIONS AT HOME:  Prior to Admission medications   Medication Sig Start Date End Date Taking? Authorizing Provider  calcium carbonate (OS-CAL) 600 MG TABS tablet Take 1 tablet by mouth daily. 08/31/12  Yes Historical Provider, MD  Cholecalciferol (VITAMIN D3) 1000 units CAPS Take 1,000 Units by mouth daily.   Yes Historical Provider, MD  donepezil (ARICEPT) 10 MG tablet Take 10 mg by mouth at bedtime.  01/02/16  Yes Historical Provider, MD  loratadine (CLARITIN) 10 MG tablet Take 1 tablet by mouth daily as needed for allergies.    Yes Historical Provider, MD  meloxicam (MOBIC) 15 MG tablet Take 1 tablet (15 mg total) by mouth daily. Patient taking differently: Take 15 mg by mouth daily as needed for pain.  04/02/15  Yes Lorie PhenixNancy Maloney, MD  Acetaminophen 500 MG coapsule Take 1 capsule (500 mg total) by mouth every 4 (four) hours as needed for fever. 07/09/15   Lorie PhenixNancy Maloney, MD      PHYSICAL EXAMINATION:    VITAL SIGNS: Blood pressure 121/57, pulse 117, temperature 99.9 F (37.7 C), temperature source Oral, resp. rate 18, height 5\' 7"  (1.702 m), weight 52 kg (114 lb 10.2 oz), SpO2 97 %.  GENERAL:  75 y.o.-year-old patient lying in the bed with no acute distress.  EYES: Pupils equal, round, reactive to light and accommodation. No scleral icterus. Extraocular muscles intact.  HEENT: Head atraumatic, normocephalic. Oropharynx and nasopharynx clear.  NECK:  Supple, no jugular venous distention. No thyroid enlargement, no tenderness.  LUNGS: Normal breath sounds bilaterally, no wheezing, rales,rhonchi or crepitation. No use of accessory muscles of respiration.  CARDIOVASCULAR: S1, S2 regular and fast. No murmurs, rubs, or gallops.  ABDOMEN: Soft, nontender, nondistended. Bowel sounds present. No organomegaly or mass. Surgical dressing and drainage tube present. EXTREMITIES: No pedal edema, cyanosis, or clubbing.  NEUROLOGIC: Cranial nerves II through XII are intact. Muscle strength 5/5 in all extremities. Sensation intact. Gait not checked.  PSYCHIATRIC: The patient is alert and oriented x 1. Very anxious, and asking same questions to me again. SKIN: No obvious rash, lesion, or ulcer.   LABORATORY PANEL:   CBC  Recent Labs Lab 02/05/16 1030 02/06/16 0412 02/08/16 0607 02/10/16 0603  WBC 14.7* 19.2* 13.3* 11.3*  HGB 11.1* 11.7* 10.6* 10.0*  HCT 33.4* 35.4 31.4* 29.4*  PLT 568* 609* 474* 400  MCV 90.5 92.6 91.1 91.0  MCH 30.0 30.8 30.7 30.9  MCHC 33.2 33.2 33.7 33.9  RDW 14.6* 14.6* 14.7* 14.9*   ------------------------------------------------------------------------------------------------------------------  Chemistries   Recent Labs Lab 02/07/16 0457 02/08/16 0607 02/09/16 0751 02/10/16 0603 02/11/16 0527 02/12/16 0528  NA 136 136 136 136 138 137  K 2.9* 3.5 3.5 3.3* 3.7 4.2  CL 104 106 105 106 105 104  CO2 26 25 27 26 27 28   GLUCOSE 127* 144* 149* 131* 120* 116*  BUN  18 19 18  21* 21* 25*  CREATININE 0.58 0.59 0.53 0.67 0.65 0.64  CALCIUM 8.1* 7.9* 7.9* 7.7* 7.9* 8.0*  MG 1.9  --  1.7 1.8 1.7 1.8   ------------------------------------------------------------------------------------------------------------------ estimated creatinine clearance is 51.4 mL/min (by C-G formula based on Cr of 0.64). ------------------------------------------------------------------------------------------------------------------  Recent Labs  02/11/16 0527  TSH 2.644     Coagulation profile No results for input(s): INR, PROTIME in the last 168 hours. ------------------------------------------------------------------------------------------------------------------- No results for input(s): DDIMER in the last 72 hours. -------------------------------------------------------------------------------------------------------------------  Cardiac Enzymes No results for input(s): CKMB, TROPONINI, MYOGLOBIN in the last 168 hours.  Invalid input(s): CK ------------------------------------------------------------------------------------------------------------------ Invalid input(s): POCBNP  ---------------------------------------------------------------------------------------------------------------  Urinalysis    Component Value Date/Time   COLORURINE YELLOW* 01/18/2016 2011   APPEARANCEUR CLEAR* 01/18/2016 2011   LABSPEC 1.015 01/18/2016 2011   PHURINE 5.0 01/18/2016 2011   GLUCOSEU NEGATIVE 01/18/2016 2011   HGBUR 3+* 01/18/2016 2011   BILIRUBINUR NEGATIVE 01/18/2016 2011   KETONESUR TRACE* 01/18/2016 2011   PROTEINUR NEGATIVE 01/18/2016 2011   NITRITE NEGATIVE 01/18/2016 2011   LEUKOCYTESUR NEGATIVE 01/18/2016 2011  RADIOLOGY: Ct Abdomen Pelvis W Contrast  02/10/2016  CLINICAL DATA:  Followup abdominal abscesses. EXAM: CT ABDOMEN AND PELVIS WITH CONTRAST TECHNIQUE: Multidetector CT imaging of the abdomen and pelvis was performed using the standard  protocol following bolus administration of intravenous contrast. CONTRAST:  85mL ISOVUE-300 IOPAMIDOL (ISOVUE-300) INJECTION 61% COMPARISON:  01/31/2016 FINDINGS: Lower chest: Dependent changes and pleural thickening overlying the posterior right lower lobe noted. No pleural effusion identified. Hepatobiliary: Cyst overlying the lateral segment of left lobe of liver noted, image 20 of series 2. Subhepatic abscess is identified along the undersurface of the right lobe of liver measuring 5 x 2.3 x 4.6 cm. This is more well defined than on the previous exam. Gallbladder appears normal. No biliary dilatation. Pancreas: Normal appearance of the pancreas. Spleen: No focal splenic abnormality. Small fluid collection along the superior margin of the spleen measures 1.9 x 3.2 cm, image 17 of series 2. Previously this measured 2.8 x 4.3 cm. Adrenals/Urinary Tract: The adrenal glands are negative. Bilateral renal sinus cysts are noted. Urinary bladder appears normal. Stomach/Bowel: Fluid collection along the greater curvature the stomach measures 3.4 x 1.8 by 3.0 cm. Previously this measured 6.6 x 4.7 x 7.5 cm. No pathologic dilatation of the large or small bowel loops. There is a left lower quadrant colostomy. Vascular/Lymphatic: Aortic atherosclerosis noted. No upper abdominal adenopathy. No pelvic or inguinal adenopathy. Reproductive: Previous hysterectomy. Complex fluid collection within the right side of pelvis is again identified and is favored to represent hydrosalpinx. This measures 5.4 x 3.7 cm, image 71 of series 2 Other: Left lower quadrant abscess measures 1.5 x 1.8 cm, image 33 of series 2. Previously 2.4 x 2.7 cm. There is a JP drain within the upper abdomen. This does not appear to be situated in any specific fluid collection. Additionally, there is a right lower quadrant percutaneous pigtail drainage catheter, image 51 of series 2. This does not appear to be situated in any specific fluid collection either.  Musculoskeletal: No aggressive lytic or sclerotic bone lesions identified. IMPRESSION: 1. There has been interval decrease in volume of the perisplenic, perigastric fluid collections and left lower quadrant fluid collections. 2. Subhepatic abscess along the inferior right lobe of liver appears more well-defined than on the previous exam. 3. Suspected right-sided hydrosalpinx. 4. There are 2 percutaneous drainage catheters within the abdomen. At this time, these do not appear to be situated in any particular fluid collection. Electronically Signed   By: Signa Kell M.D.   On: 02/10/2016 10:33    EKG: Orders placed or performed during the hospital encounter of 01/18/16  . EKG 12-Lead  . EKG 12-Lead  . ED EKG  . ED EKG    IMPRESSION AND PLAN:  * SVT   Likely due to anxiety.   Check TSH , and echo.    Mg normal.   BP is stable, not much room for rate controlling meds.   Will give xanax for now.    Pain control appears proper.  * Anxiety    Xanax for now  * perforated bowel and infection.  s/p sx, ID on case, On ABx.  manage per primary team.  * hypertension   Cont metoprolol, stable.  All the records are reviewed and case discussed with ED provider. Management plans discussed with the patient, family and they are in agreement.  CODE STATUS:    Code Status Orders        Start     Ordered   01/19/16 [phone]  Full code   Continuous     01/19/16 0251    Code Status History    Date Active Date Inactive Code Status Order ID Comments User Context   This patient has a current code status but no historical code status.       TOTAL TIME TAKING CARE OF THIS PATIENT: 45 minutes.    Altamese Dilling M.D on 02/12/2016   Between 7am to 6pm - Pager - [phone]  After 6pm go to [email or web address] - password [email or web address]  [facility]  Office  [phone]  CC: Primary care physician; Lorie Phenix, MD   Note: This dictation was prepared with Dragon  dictation along with smaller phrase technology. Any transcriptional errors that result from this process are unintentional.

## 2016-02-12 NOTE — Plan of Care (Signed)
Problem: Nutrition: Goal: Adequate nutrition will be maintained Outcome: Not Progressing Not taking even 25 % of meal trays

## 2016-02-12 NOTE — Progress Notes (Signed)
Calorie Count Note  Calorie count ordered.  Diet: soft Supplements: ensure enlive TID  Breakfast: 4/20 tray ticket 378 kcals and 22.5 g/d Lunch:  4/20 tray ticket 95 kcals and 1 g of protein Dinner: Tray tickets from 4/19 (506 kcals, 34 g of protein) Supplements: drank 2 ensures but included in meals  Total kcals 979 kcals and 58 g/d of protein   979 kcal (68% of minimum estimated needs)  58 protein (100% of minimum estimated needs)  Nutrition Dx: Inadequate oral intake related to altered GI function as evidenced by NPO status, improving  Goal: Pt will meet greater than or equal to 90% of their needs  Intervention: Monitor intake. Pt currently not meeting nutritional needs, but improving as on solid foods and drinking ensure Spoke with Dr. Michela PitcherEly and plan is for TPN to continue at goal rate of 3160ml/hr and soft diet with supplements.   Schon Zeiders B. Freida BusmanAllen, RD, LDN [phone] (pager) Weekend/On-Call pager [phone])

## 2016-02-13 LAB — BASIC METABOLIC PANEL
ANION GAP: 6 (ref 5–15)
BUN: 22 mg/dL — ABNORMAL HIGH (ref 6–20)
CALCIUM: 7.9 mg/dL — AB (ref 8.9–10.3)
CO2: 27 mmol/L (ref 22–32)
Chloride: 104 mmol/L (ref 101–111)
Creatinine, Ser: 0.57 mg/dL (ref 0.44–1.00)
Glucose, Bld: 129 mg/dL — ABNORMAL HIGH (ref 65–99)
POTASSIUM: 3.9 mmol/L (ref 3.5–5.1)
SODIUM: 137 mmol/L (ref 135–145)

## 2016-02-13 LAB — GLUCOSE, CAPILLARY
GLUCOSE-CAPILLARY: 125 mg/dL — AB (ref 65–99)
GLUCOSE-CAPILLARY: 136 mg/dL — AB (ref 65–99)
Glucose-Capillary: 107 mg/dL — ABNORMAL HIGH (ref 65–99)
Glucose-Capillary: 108 mg/dL — ABNORMAL HIGH (ref 65–99)
Glucose-Capillary: 112 mg/dL — ABNORMAL HIGH (ref 65–99)
Glucose-Capillary: 116 mg/dL — ABNORMAL HIGH (ref 65–99)
Glucose-Capillary: 144 mg/dL — ABNORMAL HIGH (ref 65–99)

## 2016-02-13 LAB — MAGNESIUM: MAGNESIUM: 1.9 mg/dL (ref 1.7–2.4)

## 2016-02-13 LAB — PHOSPHORUS: Phosphorus: 3 mg/dL (ref 2.5–4.6)

## 2016-02-13 MED ORDER — TRACE MINERALS CR-CU-MN-SE-ZN 10-1000-500-60 MCG/ML IV SOLN
INTRAVENOUS | Status: DC
  Administered 2016-02-13: 19:00:00 via INTRAVENOUS
  Filled 2016-02-13: qty 720

## 2016-02-13 NOTE — Clinical Social Work Note (Signed)
MD stated patient may be able to discharge tomorrow once weaned from TPN. [facility] and he is aware and in agreement. York SpanielMonica Treyana Sturgell MSW,LCSW [phone]

## 2016-02-13 NOTE — Progress Notes (Signed)
[facility] [place] [facility]   PATIENT NAME: Katherine PollockRosemarie Wood    MR#:  [id number]  DATE OF BIRTH:  02/18/1941  SUBJECTIVE:  CHIEF COMPLAINT:   Chief Complaint  Patient presents with  . Abdominal Pain   Weakness and anxiety. She feels better today. REVIEW OF SYSTEMS:  CONSTITUTIONAL: No fever, has better oral intake and weakness.  EYES: No blurred or double vision.  EARS, NOSE, AND THROAT: No tinnitus or ear pain.  RESPIRATORY: No cough, shortness of breath, wheezing or hemoptysis.  CARDIOVASCULAR: No chest pain, orthopnea, edema.  GASTROINTESTINAL: No nausea, vomiting, diarrhea or abdominal pain.  GENITOURINARY: No dysuria, hematuria.  ENDOCRINE: No polyuria, nocturia,  HEMATOLOGY: No anemia, easy bruising or bleeding SKIN: No rash or lesion. MUSCULOSKELETAL: No joint pain or arthritis.   NEUROLOGIC: No tingling, numbness, weakness.  PSYCHIATRY: has anxiety but no depression.   DRUG ALLERGIES:   Allergies  Allergen Reactions  . Galantamine Other (See Comments)    VITALS:  Blood pressure 118/61, pulse 115, temperature 98.7 F (37.1 C), temperature source Oral, resp. rate 20, height 5\' 7"  (1.702 m), weight 50.8 kg (111 lb 15.9 oz), SpO2 99 %.  PHYSICAL EXAMINATION:  GENERAL:  75 y.o.-year-old patient lying in the bed with no acute distress.  EYES: Pupils equal, round, reactive to light and accommodation. No scleral icterus. Extraocular muscles intact.  HEENT: Head atraumatic, normocephalic. Oropharynx and nasopharynx clear.  NECK:  Supple, no jugular venous distention. No thyroid enlargement, no tenderness.  LUNGS: Normal breath sounds bilaterally, no wheezing, rales,rhonchi or crepitation. No use of accessory muscles of respiration.  CARDIOVASCULAR: S1, S2 normal. No murmurs, rubs, or gallops.  ABDOMEN: Soft, nontender, nondistended. Bowel sounds present. No organomegaly or mass.  EXTREMITIES: No pedal edema, cyanosis, or clubbing.   NEUROLOGIC: Cranial nerves II through XII are intact. Muscle strength 4/5 in all extremities. Sensation intact. Gait not checked.  PSYCHIATRIC: The patient is alert and oriented x 3.  SKIN: No obvious rash, lesion, or ulcer.    LABORATORY PANEL:   CBC  Recent Labs Lab 02/10/16 0603  WBC 11.3*  HGB 10.0*  HCT 29.4*  PLT 400   ------------------------------------------------------------------------------------------------------------------  Chemistries   Recent Labs Lab 02/13/16 0449  NA 137  K 3.9  CL 104  CO2 27  GLUCOSE 129*  BUN 22*  CREATININE 0.57  CALCIUM 7.9*  MG 1.9   ------------------------------------------------------------------------------------------------------------------  Cardiac Enzymes No results for input(s): TROPONINI in the last 168 hours. ------------------------------------------------------------------------------------------------------------------  RADIOLOGY:  No results found.  EKG:   Orders placed or performed during the hospital encounter of 01/18/16  . EKG 12-Lead  . EKG 12-Lead  . ED EKG  . ED EKG    ASSESSMENT AND PLAN:   * SVT  Likely due to anxiety and dehydratioin. Better. Echo: LV EF: 60% - 65%   * Dehydration. Improving with NS IV.  * Anxiety  Xanax prn.  * perforated bowel and infection. s/p sx, ID on case, On ABx. manage per primary team.  * hypertension  Cont metoprolol, controlled.  The patient will be discharged to skilled nursing facility tomorrow per Dr. Tonita CongWoodham. We will sign off. All the records are reviewed and case discussed with Care Management/Social Workerr. Management plans discussed with the patient, her husband and they are in agreement.  CODE STATUS: full code.  TOTAL TIME TAKING CARE OF THIS PATIENT: 29 minutes.  Greater than 50% time was spent on coordination of care and face-to-face counseling.  POSSIBLE  D/C IN 1 DAYS, DEPENDING ON CLINICAL CONDITION.   Katherine Wood  M.D on 02/13/2016 at 1:44 PM  Between 7am to 6pm - Pager - [phone]  After 6pm go to [email or web address] - password [email or web address] [facility]  [place] [facility]  Office  [phone]  CC: Primary care physician; Lorie Phenix, MD

## 2016-02-13 NOTE — Progress Notes (Signed)
Infectious Disease Long Term IV Antibiotic Orders  Diagnosis: Ruptured diverticulitis -  - abd abscesses  Culture results VRE Pseudomonas  Allergies:  Allergies  Allergen Reactions  . Galantamine Other (See Comments)    Discharge antibiotics Daptomycin 500  mg every  24 hours   (weekly CPK) Meropenem  1 mg every  8 Hours  PICC Care per protocol Labs weekly while on IV antibiotics      CBC w diff   Comprehensive met panel CRP  ESR CPK (for daptomycin)   Planned duration of antibiotics 2 weeks   Stop dateTentative - MAY 5th - do not stop wtihout ok from Dr Ola Spurr Follow up clinic date TBD 2 weeks  FAX weekly labs to [phone]  Leonel Ramsay, MD

## 2016-02-13 NOTE — Care Management Important Message (Signed)
Important Message  Patient Details  Name: Katherine Wood MRN: [id number] Date of Birth: [birth year]   Medicare Important Message Given:  Yes    Chapman FitchBOWEN, Hewitt Garner T, RN 02/13/2016, 11:27 AM

## 2016-02-13 NOTE — Care Management (Signed)
Plan for patient to discharge to SNF.  CSW following. RNCM signing.

## 2016-02-13 NOTE — Consult Note (Signed)
PARENTERAL NUTRITION CONSULT NOTE - FOLLOW UP   Pharmacy Consult for electrolytes/glucose Indication: TPN  Allergies  Allergen Reactions  . Galantamine Other (See Comments)   Patient Measurements: Height: 5\' 7"  (170.2 cm) Weight: 111 lb 15.9 oz (50.8 kg) IBW/kg (Calculated) : 61.6  Labs: No results for input(s): WBC, HGB, HCT, PLT, APTT, INR in the last 72 hours.  Recent Labs  02/11/16 0527 02/12/16 0528 02/13/16 0449  NA 138 137 137  K 3.7 4.2 3.9  CL 105 104 104  CO2 27 28 27   GLUCOSE 120* 116* 129*  BUN 21* 25* 22*  CREATININE 0.65 0.64 0.57  CALCIUM 7.9* 8.0* 7.9*  MG 1.7 1.8 1.9  PHOS 3.2  --  3.0   Estimated Creatinine Clearance: 50.2 mL/min (by C-G formula based on Cr of 0.57).    Recent Labs  02/12/16 2017 02/13/16 0020 02/13/16 0402  GLUCAP 113* 144* 125*   Insulin Requirements in the past 24 hours:  5 units (SSI q4h)  Current Nutrition:  Clinimix E 5/20 started 01/28/16, now at 60 ml/hr Clear liquids 4/13  Assessment: All electrolytes and BG are WNL this morning. K 3.9, Mg 1.9, and Phos 3.0.  Plan:  Will check electrolytes with AM labs tomorrow.  Pharmacy will continue to monitor and adjust per consult.   Keturah BarreMary Lorayne Getchell, PharmD Clinical Pharmacist 02/13/2016 7:25 AM

## 2016-02-13 NOTE — Progress Notes (Signed)
12 Days Post-Op   Subjective:  Patient without complaints this morning. She continues to be pleasantly demented. She has been tolerating more of her diet and was reported to be normal 100% of her breakfast today.  Vital signs in last 24 hours: Temp:  [98.7 F (37.1 C)-99.4 F (37.4 C)] 98.7 F (37.1 C) (04/21 0857) Pulse Rate:  [113-122] 115 (04/21 0857) Resp:  [18-22] 20 (04/21 0857) BP: (108-119)/(57-65) 118/61 mmHg (04/21 0857) SpO2:  [95 %-99 %] 99 % (04/21 0857) Weight:  [50.8 kg (111 lb 15.9 oz)] 50.8 kg (111 lb 15.9 oz) (04/21 0439) Last BM Date: 02/10/16  Intake/Output from previous day: 04/20 0701 - 04/21 0700 In: 2900 [I.V.:1071.6; IV Piggyback:84.4; TPN:1744] Out: 3000 [Urine:2500; Stool:500]  GI: Abdomen soft, nontender, nondistended. Midline wound is pink with good granulation tissue without evidence of any erythema or purulence. Ostomy present left lower quadrant that is pink and productive of stool and gas. Drain in place to the right side of the abdomen draining a dark greenish colored fluid consistent with very low output fistula  Lab Results:  CBC No results for input(s): WBC, HGB, HCT, PLT in the last 72 hours. CMP     Component Value Date/Time   NA 137 02/13/2016 0449   NA 144 09/11/2013   K 3.9 02/13/2016 0449   CL 104 02/13/2016 0449   CO2 27 02/13/2016 0449   GLUCOSE 129* 02/13/2016 0449   BUN 22* 02/13/2016 0449   BUN 22* 09/11/2013   CREATININE 0.57 02/13/2016 0449   CALCIUM 7.9* 02/13/2016 0449   PROT 6.5 02/04/2016 1530   ALBUMIN 2.1* 02/04/2016 1530   AST 21 02/04/2016 1530   ALT 13* 02/04/2016 1530   ALKPHOS 93 02/04/2016 1530   BILITOT 0.6 02/04/2016 1530   GFRNONAA >60 02/13/2016 0449   GFRAA >60 02/13/2016 0449   PT/INR No results for input(s): LABPROT, INR in the last 72 hours.  Studies/Results: No results found.  Assessment/Plan: 75 year old female status post export her laparotomy with end colostomy creation for ruptured  diverticulitis. She is also status post exploration and drainage of intra-abdominal infections that of prolonged her hospital course. She has been gradually improving and starting to tolerate more of a diet. Plan to wean her off of TPN today with hopes of stopping it tomorrow. Continue to encourage ambulation, incentive spirometer usage, oral intake. Appreciate internal medicine and infectious disease consultations for her other chronic medical problems as well as her VRE and Pseudomonas infections. Hope for transfer to skilled nursing facility tomorrow. We will need to know length of time for treatment of antibiotics prior to discharge.   Ricarda Frameharles Samar Dass, MD FACS General Surgeon  02/13/2016

## 2016-02-13 NOTE — Progress Notes (Signed)
Pharmacy Antibiotic Note  Katherine Wood is a 75 y.o. female admitted on 01/18/2016 with abdominal abscess.  Pharmacy has been consulted for daptomycin and meropenem dosing for VRE and pseudomonas. Patient is currently on TPN and day #11 post-op.   Patient is currently on day #12 of daptomycin and day #3 of meropenem. This is day #27 of antibiotics total.   Plan: Continue meropenem 1gm IV Q8H  Continue Daptomycin 500mg  IV q24h Will continue to check CK weekly with labs ordered on 4/22   Height: 5\' 7"  (170.2 cm) Weight: 111 lb 15.9 oz (50.8 kg) IBW/kg (Calculated) : 61.6  Temp (24hrs), Avg:99.2 F (37.3 C), Min:98.9 F (37.2 C), Max:99.4 F (37.4 C)   Recent Labs Lab 02/08/16 0607 02/09/16 0751 02/10/16 0603 02/11/16 0527 02/12/16 0528 02/13/16 0449  WBC 13.3*  --  11.3*  --   --   --   CREATININE 0.59 0.53 0.67 0.65 0.64 0.57    Estimated Creatinine Clearance: 50.2 mL/min (by C-G formula based on Cr of 0.57).    Allergies  Allergen Reactions  . Galantamine Other (See Comments)    Antimicrobials this admission: Ertapenem  4/2 >> 4/6 Zosyn  4/6 >> 4/11, 4/15>4/17 Flagyl 4/2>>4/17 Vancomycin 4/7 >> 4/8 Fluconazole 4/8>> Daptomycin 4/8>> Ciprofloxacin 4/11 >>4/15 Meropenem 4/17 >>  Microbiology results: 4/9 Wound Culture: P. Aeruginosa - sensitive to Cipro 4/4 Abd.Abscess CX: P. aeruginosa, VRE 3/27 MRSA PCR: negative  Pharmacy will continue to monitor and adjust per consult.    Cindi CarbonMary M Monette Omara, PharmD Clinical Pharmacist 02/13/2016 7:37 AM

## 2016-02-13 NOTE — Progress Notes (Signed)
[name] PROGRESS NOTE Date of Admission:  01/18/2016     ID: Katherine Wood is a 75 y.o. female with abd abscesses, pseudomonas and VRE  Active Problems:   Bowel perforation ([place])   Hernia of abdominal cavity   Diverticulitis of colon with perforation   Protein-calorie malnutrition, severe   Abscess of abdominal cavity (H[facility])  Subjective: Remains on meropenem and dapto  One drain removed. No fevers. Eating more.  ROS  Unable to obtain Medications:  Antibiotics Given (last 72 hours)    Date/Time Action Medication Dose Rate   02/10/16 1825 Given   DAPTOmycin (CUBICIN) 500 mg in sodium chloride 0.9 % IVPB 500 mg 220 mL/hr   02/10/16 2120 Given   meropenem (MERREM) 1 g in sodium chloride 0.9 % 100 mL IVPB 1 g 200 mL/hr   02/11/16 0526 Given   meropenem (MERREM) 1 g in sodium chloride 0.9 % 100 mL IVPB 1 g 200 mL/hr   02/11/16 1313 Given   meropenem (MERREM) 1 g in sodium chloride 0.9 % 100 mL IVPB 1 g 200 mL/hr   02/11/16 1827 Given   DAPTOmycin (CUBICIN) 500 mg in sodium chloride 0.9 % IVPB 500 mg 220 mL/hr   02/11/16 2213 Given   meropenem (MERREM) 1 g in sodium chloride 0.9 % 100 mL IVPB 1 g 200 mL/hr   02/12/16 0526 Given   meropenem (MERREM) 1 g in sodium chloride 0.9 % 100 mL IVPB 1 g 200 mL/hr   02/12/16 1310 Given   meropenem (MERREM) 1 g in sodium chloride 0.9 % 100 mL IVPB 1 g 200 mL/hr   02/12/16 1837 Given   DAPTOmycin (CUBICIN) 500 mg in sodium chloride 0.9 % IVPB 500 mg 220 mL/hr   02/12/16 2202 Given   meropenem (MERREM) 1 g in sodium chloride 0.9 % 100 mL IVPB 1 g 200 mL/hr   02/13/16 0557 Given   meropenem (MERREM) 1 g in sodium chloride 0.9 % 100 mL IVPB 1 g 200 mL/hr   02/13/16 1359 Given   meropenem (MERREM) 1 g in sodium chloride 0.9 % 100 mL IVPB 1 g 200 mL/hr     . sodium chloride   Intravenous Once  . antiseptic oral rinse  7 mL Mouth Rinse QID  . chlorhexidine gluconate (SAGE KIT)  15 mL Mouth Rinse BID  . DAPTOmycin  (CUBICIN)  IV  500 mg Intravenous Q24H  . donepezil  10 mg Oral QHS  . enoxaparin (LOVENOX) injection  40 mg Subcutaneous QHS  . feeding supplement (ENSURE ENLIVE)  237 mL Oral TID  . insulin aspart  0-9 Units Subcutaneous 6 times per day  . meropenem (MERREM) IV  1 g Intravenous 3 times per day  . metoprolol tartrate  12.5 mg Oral BID  . pantoprazole (PROTONIX) IV  40 mg Intravenous Q12H  . sodium chloride flush  10-40 mL Intracatheter Q12H    Objective: Vital signs in last 24 hours: Temp:  [98.7 F (37.1 C)-99.4 F (37.4 C)] 98.7 F (37.1 C) (04/21 0857) Pulse Rate:  [113-119] 115 (04/21 0857) Resp:  [20-22] 20 (04/21 0857) BP: (111-119)/(57-61) 118/61 mmHg (04/21 0857) SpO2:  [95 %-99 %] 99 % (04/21 0857) Weight:  [50.8 kg (111 lb 15.9 oz)] 50.8 kg (111 lb 15.9 oz) (04/21 0439) Constitutional: Frail, dementia HENT: [place]/AT, PERRLA, no scleral icterus Mouth/Throat: Oropharynx is clear and moist. No oropharyngeal exudate.  Cardiovascular: Normal rate, regular rhythm and normal heart sounds. Pulmonary/Chest: Effort normal and breath  sounds normal. No respiratory distress. has no wheezes.  Neck = supple, no nuchal rigidity Abdominal: Soft. Incision dehisced is quite clean, one drain in place with brown liquid, has colostomy with soft stools. Lymphadenopathy: no cervical adenopathy. No axillary adenopathy Neurological: dementia Skin: Skin is warm and dry. No rash noted. No erythema.   Lab Results  Recent Labs  02/12/16 0528 02/13/16 0449  NA 137 137  K 4.2 3.9  CL 104 104  CO2 28 27  BUN 25* 22*  CREATININE 0.64 0.57    Microbiology: Results for orders placed or performed during the hospital encounter of 01/18/16  MRSA PCR Screening     Status: None   Collection Time: 01/19/16  3:02 AM  Result Value Ref Range Status   MRSA by PCR NEGATIVE NEGATIVE Final    Comment:        The GeneXpert MRSA Assay (FDA approved for NASAL specimens only), is one component of  a comprehensive MRSA colonization surveillance program. It is not intended to diagnose MRSA infection nor to guide or monitor treatment for MRSA infections.   Culture, routine-abscess     Status: None   Collection Time: 01/27/16  2:05 PM  Result Value Ref Range Status   Specimen Description ABSCESS  Final   Special Requests Normal  Final   Gram Stain   Final    MODERATE WBC SEEN MANY GRAM POSITIVE COCCI IN PAIRS MODERATE GRAM NEGATIVE RODS    Culture   Final    HEAVY GROWTH VANCOMYCIN RESISTANT ENTEROCOCCUS MODERATE GROWTH PSEUDOMONAS AERUGINOSA REFER TO OTHER ABSCESS CULTURE FOR SUSCEPTIBILITIES    Report Status 01/31/2016 FINAL  Final   Organism ID, Bacteria PSEUDOMONAS AERUGINOSA  Final      Susceptibility   Pseudomonas aeruginosa - MIC*    CEFTAZIDIME <=1 SENSITIVE Sensitive     CIPROFLOXACIN <=0.25 SENSITIVE Sensitive     GENTAMICIN <=1 SENSITIVE Sensitive     IMIPENEM 1 SENSITIVE Sensitive     CEFEPIME <=1 SENSITIVE Sensitive     * MODERATE GROWTH PSEUDOMONAS AERUGINOSA  Culture, routine-abscess     Status: None   Collection Time: 01/27/16  2:25 PM  Result Value Ref Range Status   Specimen Description ABSCESS  Final   Special Requests Normal  Final   Gram Stain   Final    MODERATE WBC SEEN MANY GRAM POSITIVE COCCI IN PAIRS MANY GRAM NEGATIVE RODS    Culture   Final    HEAVY GROWTH VANCOMYCIN RESISTANT ENTEROCOCCUS MODERATE GROWTH PSEUDOMONAS AERUGINOSA VRE HAVE INTRINSIC RESISTANCE TO MOST COMMONLY USED ANTIBIOTICS AND THE ABILITY TO ACQUIRE RESISTANCE TO MOST AVAILABLE ANTIBIOTICS.    Report Status 01/31/2016 FINAL  Final   Organism ID, Bacteria PSEUDOMONAS AERUGINOSA  Final   Organism ID, Bacteria VANCOMYCIN RESISTANT ENTEROCOCCUS  Final      Susceptibility   Pseudomonas aeruginosa - MIC*    CEFTAZIDIME <=1 SENSITIVE Sensitive     CIPROFLOXACIN <=0.25 SENSITIVE Sensitive     GENTAMICIN <=1 SENSITIVE Sensitive     IMIPENEM 1 SENSITIVE Sensitive      CEFEPIME <=1 SENSITIVE Sensitive     * MODERATE GROWTH PSEUDOMONAS AERUGINOSA   Vancomycin resistant enterococcus - MIC*    AMPICILLIN >=32 RESISTANT Resistant     VANCOMYCIN >=32 RESISTANT Resistant     GENTAMICIN SYNERGY SENSITIVE Sensitive     LINEZOLID Value in next row Sensitive      SENSITIVE2    * HEAVY GROWTH VANCOMYCIN RESISTANT ENTEROCOCCUS  C difficile quick scan w  PCR reflex     Status: None   Collection Time: 02/01/16 10:55 AM  Result Value Ref Range Status   C Diff antigen NEGATIVE NEGATIVE Final   C Diff toxin NEGATIVE NEGATIVE Final   C Diff interpretation Negative for C. difficile  Final  Anaerobic culture     Status: None   Collection Time: 02/01/16 11:56 AM  Result Value Ref Range Status   Specimen Description WOUND  Final   Special Requests NONE  Final   Culture NO ANAEROBES ISOLATED  Final   Report Status 02/05/2016 FINAL  Final  Culture, routine-abscess     Status: None   Collection Time: 02/01/16 11:56 AM  Result Value Ref Range Status   Specimen Description WOUND  Final   Special Requests NONE  Final   Gram Stain MANY WBC SEEN FEW GRAM NEGATIVE RODS   Final   Culture MODERATE GROWTH PSEUDOMONAS AERUGINOSA  Final   Report Status 02/04/2016 FINAL  Final   Organism ID, Bacteria PSEUDOMONAS AERUGINOSA  Final      Susceptibility   Pseudomonas aeruginosa - MIC*    CEFTAZIDIME 4 SENSITIVE Sensitive     CIPROFLOXACIN <=0.25 SENSITIVE Sensitive     GENTAMICIN <=1 SENSITIVE Sensitive     IMIPENEM 1 SENSITIVE Sensitive     CEFEPIME 2 SENSITIVE Sensitive     PIP/TAZO Value in next row Sensitive      SENSITIVE8    AMPICILLIN/SULBACTAM Value in next row Resistant      RESISTANT>=32    * MODERATE GROWTH PSEUDOMONAS AERUGINOSA    Studies/Results:  Dg Chest 1 View  01/19/2016  CLINICAL DATA:  Endotracheal tube placement.  Initial encounter. EXAM: CHEST 1 VIEW COMPARISON:  None. FINDINGS: The patient's endotracheal tube is seen ending 3-4 cm above the carina.  An enteric tube is noted extending below the diaphragm. The lungs are well-aerated. Peribronchial thickening is noted. Minimal bibasilar atelectasis is noted. There is no evidence of pleural effusion or pneumothorax. The cardiomediastinal silhouette is within normal limits. No acute osseous abnormalities are seen. IMPRESSION: 1. Endotracheal tube seen ending 3-4 cm above the carina. 2. Peribronchial thickening noted. Minimal bibasilar atelectasis noted. Electronically Signed   By: Garald Balding M.D.   On: 01/19/2016 01:55   Dg Abd 1 View  02/01/2016  CLINICAL DATA:  Check nasogastric catheter placement EXAM: ABDOMEN - 1 VIEW COMPARISON:  01/31/2016. FINDINGS: Multiple surgical and percutaneous drains are identified similar to that seen on recent exams. No nasogastric catheter is noted at this time. If one has been placed it likely lies in the more proximal esophagus. No other focal abnormality is noted. IMPRESSION: Nasogastric catheter not visualized. Multiple surgical and percutaneous drains are again seen. Electronically Signed   By: Inez Catalina M.D.   On: 02/01/2016 21:04   Ct Abdomen Pelvis W Contrast  02/10/2016  CLINICAL DATA:  Followup abdominal abscesses. EXAM: CT ABDOMEN AND PELVIS WITH CONTRAST TECHNIQUE: Multidetector CT imaging of the abdomen and pelvis was performed using the standard protocol following bolus administration of intravenous contrast. CONTRAST:  56m ISOVUE-300 IOPAMIDOL (ISOVUE-300) INJECTION 61% COMPARISON:  01/31/2016 FINDINGS: Lower chest: Dependent changes and pleural thickening overlying the posterior right lower lobe noted. No pleural effusion identified. Hepatobiliary: Cyst overlying the lateral segment of left lobe of liver noted, image 20 of series 2. Subhepatic abscess is identified along the undersurface of the right lobe of liver measuring 5 x 2.3 x 4.6 cm. This is more well defined than on the previous exam. Gallbladder  appears normal. No biliary dilatation. Pancreas:  Normal appearance of the pancreas. Spleen: No focal splenic abnormality. Small fluid collection along the superior margin of the spleen measures 1.9 x 3.2 cm, image 17 of series 2. Previously this measured 2.8 x 4.3 cm. Adrenals/Urinary Tract: The adrenal glands are negative. Bilateral renal sinus cysts are noted. Urinary bladder appears normal. Stomach/Bowel: Fluid collection along the greater curvature the stomach measures 3.4 x 1.8 by 3.0 cm. Previously this measured 6.6 x 4.7 x 7.5 cm. No pathologic dilatation of the large or small bowel loops. There is a left lower quadrant colostomy. Vascular/Lymphatic: Aortic atherosclerosis noted. No upper abdominal adenopathy. No pelvic or inguinal adenopathy. Reproductive: Previous hysterectomy. Complex fluid collection within the right side of pelvis is again identified and is favored to represent hydrosalpinx. This measures 5.4 x 3.7 cm, image 71 of series 2 Other: Left lower quadrant abscess measures 1.5 x 1.8 cm, image 33 of series 2. Previously 2.4 x 2.7 cm. There is a JP drain within the upper abdomen. This does not appear to be situated in any specific fluid collection. Additionally, there is a right lower quadrant percutaneous pigtail drainage catheter, image 51 of series 2. This does not appear to be situated in any specific fluid collection either. Musculoskeletal: No aggressive lytic or sclerotic bone lesions identified. IMPRESSION: 1. There has been interval decrease in volume of the perisplenic, perigastric fluid collections and left lower quadrant fluid collections. 2. Subhepatic abscess along the inferior right lobe of liver appears more well-defined than on the previous exam. 3. Suspected right-sided hydrosalpinx. 4. There are 2 percutaneous drainage catheters within the abdomen. At this time, these do not appear to be situated in any particular fluid collection. Electronically Signed   By: Kerby Moors M.D.   On: 02/10/2016 10:33   Ct Abdomen Pelvis W  Contrast  01/31/2016  CLINICAL DATA:  Interventional abscess questionable fistula status post drainage. She was doing okay no complication. Gram-positive out from the drain as well as Pseudomonas. Started on vancomycin and Zosyn EXAM: CT ABDOMEN AND PELVIS WITH CONTRAST TECHNIQUE: Multidetector CT imaging of the abdomen and pelvis was performed using the standard protocol following bolus administration of intravenous contrast. CONTRAST:  138m ISOVUE-300 IOPAMIDOL (ISOVUE-300) INJECTION 61% COMPARISON:  01/25/2016 FINDINGS: Lung bases: Moderate bilateral pleural effusions. Dependent atelectasis mostly in the lower lobes. Heart mildly enlarged. Findings are similar to the prior exam. Peritoneal cavity: There are residual collections. The larger collection noted adjacent to the posterior medial aspect of the liver and right pericolic gutter has been mostly evacuated following percutaneous drainage with a pigtail catheter. Catheter tip lies adjacent to the inferior margin of the liver. There is a small amount of fluid with small bubbles of air along the posterior medial aspect of the liver reflecting a residual collection. Fluid collection along the greater curvature of the stomach has mildly increased in size, now measuring 7.5 x 4.7 x 6.6 cm. Small fluid collections along the left anterior para renal space are similar. Fluid collection seen anterior to the pancreas on the prior study is no longer present. There are no new abdominal or pelvic fluid collections. Inflammatory type stranding throughout the peritoneal fat is similar to the prior study. Hepatobiliary: 8 mm cyst along the anterior margin of the left lobe lateral segment. No other liver masses or lesions. Gallbladder is distended but otherwise unremarkable. No bile duct dilation. Spleen, pancreas, adrenal glands:  Unremarkable. Kidneys, ureters, bladder: There are bilateral renal cortical and renal sinus  cysts, stable. No hydronephrosis. Ureters normal in  course and in caliber. Bladder is unremarkable. Uterus and adnexa: Uterus surgically absent. Stable right adnexal cyst, likely ovarian, measuring 4.2 cm. Lymph nodes:  No pathologically enlarged lymph nodes. Gastrointestinal: Inferior sigmoid colon terminates in a short Hartman's pouch. Left colon is diverted into a left mid abdomen colostomy. No evidence of bowel obstruction. No convincing bowel inflammation or ischemia. Minimal free air is seen in the anterior peritoneal cavity of the upper abdomen. This was present on the prior exam and has decreased in amount. Abdominal wall: Persistent soft tissue defect along the anterior abdominal midline with intact fascia. Drainage catheter lies in the pelvis. Under the pigtail catheter lies in the right mid abdomen. Another surgical drains catheter or extends across the upper abdomen below the lower greater curvature of the stomach. IMPRESSION: 1. There has been overall mild improvement when compared to the prior CT. 2. The larger collection noted along the right anterior para renal space and along the posterior medial aspect of the liver has been mostly evacuated following percutaneous drainage. A collections seen adjacent to the posterior mid to distal stomach is no longer present. There is a collection along the greater curvature of the stomach which is mildly increased in size from the prior study. Small fluid collections along the left anterior perirenal space are similar to the prior exam. Diffuse mesenteric inflammatory type change is also stable. Minimal free intraperitoneal air is decreased when compared the prior exam. 3. No new palpable intraperitoneal collections. 4. Persistent moderate pleural effusions with associated lower lobe atelectasis similar to the prior exam. Electronically Signed   By: Lajean Manes M.D.   On: 01/31/2016 12:06   Ct Abdomen Pelvis W Contrast  01/25/2016  CLINICAL DATA:  75 year old female with followup exploratory laparotomy for  bowel perforation. Hartmann's procedure. Continued drainage from abdominal drains. EXAM: CT ABDOMEN AND PELVIS WITH CONTRAST TECHNIQUE: Multidetector CT imaging of the abdomen and pelvis was performed using the standard protocol following bolus administration of intravenous contrast. CONTRAST:  141m ISOVUE-300 IOPAMIDOL (ISOVUE-300) INJECTION 61% COMPARISON:  01/18/2016 CT FINDINGS: Lower chest: Moderate bilateral pleural effusions and bilateral lower lung atelectasis noted. Mild cardiomegaly and coronary artery calcifications again identified. Hepatobiliary: The liver and gallbladder are unremarkable. No biliary dilatation identified. Pancreas: Unremarkable Spleen: Unremarkable Adrenals/Urinary Tract: Bilateral renal cysts and renal cortical atrophy again noted. The bladder and adrenal glands are unremarkable. Stomach/Bowel: There is no evidence of bowel obstruction. A left abdominal ostomy is noted. Vascular/Lymphatic: Aortic atherosclerotic calcifications noted without aneurysm. No enlarged lymph nodes. Reproductive: Patient is status post hysterectomy. Other: Multiple all intra-abdominal and pelvic abscesses containing fluid and gas are identified and include the following: A 4.8 x 11 x 16.8 cm abscess along the posterior inferior medial liver. A 3.6 x 7.1 cm abscess adjacent to the lateral aspect of the stomach (image 21). A 3.8 x 5.6 cm abscess in the left paramedian mid abdomen (image 37). A 3.3 x 3.5 cm abscess in the region of the lower gastrohepatic ligament (image 28 and 29). A 4 x 11.2 cm abscess within the anterior lower abdomen/ upper pelvis (image 57). This abscess has high density material its left aspect and maybe connected to bowel as this has the appearance of oral contrast. A definite connection is not located. A 3.2 x 3.7 cm posterior pelvic abscess (image 80). Small foci of pneumoperitoneum/ free air noted. Two abdominal drains are present, 1 with tip in the anterior left abdomen and other  with tip in the high left abdomen posterior to the spleen. No collections are identified adjacent to the catheter tips. Diffuse subcutaneous edema is noted. A small right inguinal hernia containing fat is noted. Musculoskeletal: No acute abnormalities identified. IMPRESSION: Multiple intra-abdominal and intrapelvic abscesses as described, with the largest measuring 4.8 x 11 x 16.8 cm along the posterior inferior medial liver. The 4 x 11.2 cm abscess within the anterior lower abdomen/ upper pelvis as high-density material and could represent oral contrast from connection to bowel. Postoperative changes as described with small residual foci of pneumoperitoneum. Moderate bilateral pleural effusions and bilateral lower lung atelectasis. These results were discussed with Dr. Bary Castilla on 01/25/2016 at 11:15 AM. Electronically Signed   By: Margarette Canada M.D.   On: 01/25/2016 11:16   Ct Abdomen Pelvis W Contrast  01/18/2016  ADDENDUM REPORT: 01/18/2016 19:52 ADDENDUM: These results were called by telephone at the time of interpretation on 01/18/2016 at 7:51 pm to Dr. Hinda Kehr , who verbally acknowledged these results. Electronically Signed   By: Anner Crete M.D.   On: 01/18/2016 19:52  01/18/2016  CLINICAL DATA:  75 year old female with abdominal pain and diarrhea. No surgery. EXAM: CT ABDOMEN AND PELVIS WITH CONTRAST TECHNIQUE: Multidetector CT imaging of the abdomen and pelvis was performed using the standard protocol following bolus administration of intravenous contrast. CONTRAST:  29m ISOVUE-300 IOPAMIDOL (ISOVUE-300) INJECTION 61% COMPARISON:  CT dated 10/09/2007 FINDINGS: Linear bibasilar atelectasis/ scarring. There is large pneumoperitoneum. There is diffuse stranding of the mesentery and omental fat. Small ascites. Mild apparent fatty infiltration of the liver. A 1 cm hypodense lesion in the left lobe of the liver is not well characterized but likely represents a cyst or hemangioma. The gallbladder is  mildly distended. No calcified gallstone identified. The pancreas, spleen, adrenal glands appear unremarkable. There bilateral renal parapelvic cysts. Multiple smaller renal hypodense lesions are not well characterized but likely represent cysts. There is no hydronephrosis on either side. The visualized ureters and urinary bladder appear unremarkable. Hysterectomy. There is sigmoid diverticulosis with muscular hypertrophy. No active inflammatory changes. There is inflammatory changes and thickening of the body, and antrum of the stomach. Multiple loculated extraluminal air noted in the anterior abdomen and along the greater curvature of the stomach. No extraluminal extravasation of oral contrast identified. There is no evidence of bowel obstruction. The visualized appendix appears unremarkable. There is mild aortoiliac atherosclerotic disease. No portal venous gas identified. There is no adenopathy. There is a small fat containing umbilical hernia. There is mild osteopenia and degenerative changes of the spine. No acute fracture. IMPRESSION: Large amount of pneumoperitoneum most concerning for bowel perforation. The site of presumed bowel perforation is not identified with certainty on the CT. There is thickening of the body and antrum of the stomach. No extraluminal oral contrast identified. Clinical correlation and surgical consult is advised. Sigmoid diverticulosis without active inflammatory changes. Electronically Signed: By: AAnner CreteM.D. On: 01/18/2016 19:39   Ct Image Guided Drainage Percut Cath  Peritoneal Retroperit  01/27/2016  INDICATION: Status post exploratory laparotomy for colonic perforation with postoperative fluid collections EXAM: CT GUIDED DRAINAGE OF PERIHEPATIC AND MID ABDOMINAL ABSCESS MEDICATIONS: The patient is currently admitted to the hospital and receiving intravenous antibiotics. The antibiotics were administered within an appropriate time frame prior to the initiation of the  procedure. ANESTHESIA/SEDATION: None COMPLICATIONS: None immediate. TECHNIQUE: Informed written consent was obtained from the patient's husband after a thorough discussion of the procedural risks, benefits and alternatives. All questions  were addressed. Maximal Sterile Barrier Technique was utilized including caps, mask, sterile gowns, sterile gloves, sterile drape, hand hygiene and skin antiseptic. A timeout was performed prior to the initiation of the procedure. PROCEDURE: The right mid abdominal wall was prepped with Chlorhexidine in a sterile fashion, and a sterile drape was applied covering the operative field. A sterile gown and sterile gloves were used for the procedure. Local anesthesia was provided with 1% Lidocaine. Utilizing CT guidance, a 70 French pigtail catheter was placed utilizing a quick stick technique into the large anterior mid abdominal collection containing contrast material indicative of communication with the adjacent bowel loops. The catheter was sutured into place and placed to external suction drainage. Utilizing a similar technique, a second 39 French pigtail catheter was placed in the perihepatic fluid collection. This also demonstrates some increased density within consistent with contrast from communication with adjacent bowel loops. The catheter was affixed at the skin surface and placed to external suction drainage. Approximately 550 mL of brownish liquid likely related to bowel contents was aspirated. Samples were sent for laboratory evaluation. The patient tolerated the procedure well and was returned to her room in satisfactory condition. FINDINGS: Both fluid collections demonstrated increased amount of air as well as increasing opacification with orally administered contrast material consistent with communication with the adjacent bowel loops. The fluid obtained from the collections has the appearance of frank bowel contents as opposed to focal pockets of purulent material.  IMPRESSION: Successful placement of two 14 French drainage catheters as described above. The collections drained were nearly completely decompressed. Contrast was noted in both collections which is a slight change from the recent CT examination 0 01/24/13 consistent with communication with adjacent bowel loops. Electronically Signed   By: Inez Catalina M.D.   On: 01/27/2016 15:33   Assessment:  Katherine Wood is a 75 y.o. female With complicated post op course after perforated diverticulitis with multiple abscesses, dehisced incision and fistula. 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  FU cx from repeat surgery continues with Pseudomonas sensitive to cipro Changed to invanz from cipro 4/15 due to increasing wbc Changed to meropenem from invanz 4/17 - remains on dapto 4/21 - taking some orals, no fevers.  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  For now would plan on 2 weeks and I can see in follow up in clinic and can discuss with surgery at that time. Please inform me of any clinical changes that would require adjustment in her abx.   Thank you very much for the consult. Will follow with you.  Chastin Riesgo P   02/13/2016, 2:49 PM

## 2016-02-13 NOTE — Progress Notes (Signed)
   02/13/16 1000  Clinical Encounter Type  Visited With Patient  Visit Type Follow-up  Consult/Referral To Chaplain  Chaplain provided pastoral care to patient who appeared to be a bit confused and lonely. Husband did walk in on visit and patient's mood perked up. She stated she had not seen him in a couple of days but husband stated that was not true.   Fisher ScientificChaplain Amandajo Gonder [phone]

## 2016-02-13 NOTE — Progress Notes (Signed)
Calorie Count Note  Calorie count ordered.  Diet: Soft Supplements: Ensure Enlive TID  Breakfast: 4/21 tray ticket 645kcals and 27g protein (pt ate 100% of french toast with Ensure Enlive) Lunch: 4/21 tray ticket n/a pt only ate bites of [place] swiss wrap and mashed potatoes (Nsg reports this was second attempt for pt to eat lunch as she did not want chicken and rice that was previously sent) Dinner: 4/20 tray ticket 534kcals and 37g protein (pt ate pork, mashed potatoes and drink Ensure Enlive) Supplements: Pt drank 2 out of 3 Ensure Enlive  Total intake in 24 hours: 1180 kcal (81% of minimum estimated needs)  64g protein (100% of minimum estimated needs)  Nutrition Dx: Inadequate oral intake related to altered GI function as evidenced by NPO status, improving  Goal: Pt will meet greater than or equal to 90% of their needs  Intervention: Monitor intake. Pt intake improving on solid foods as pt ate 100% of french toast this am with Ensure Enlive. Per MD, Tonita CongWoodham agreeable to turn TPN to half rate, 4630mL/hr, with the goal tomorrow to discontinue. Will also recommend discontinuing Calorie Count as pt po intake continues to improve and pt consuming most of supplement drinks. Recommend continuing to encourage po intake and supplements.   Leda QuailAllyson Javohn Basey, RD, LDN Pager [phone] Weekend/On-Call Pager [phone]

## 2016-02-14 LAB — GLUCOSE, CAPILLARY
GLUCOSE-CAPILLARY: 123 mg/dL — AB (ref 65–99)
Glucose-Capillary: 117 mg/dL — ABNORMAL HIGH (ref 65–99)

## 2016-02-14 LAB — POTASSIUM: Potassium: 4 mmol/L (ref 3.5–5.1)

## 2016-02-14 LAB — MAGNESIUM: MAGNESIUM: 2 mg/dL (ref 1.7–2.4)

## 2016-02-14 MED ORDER — SODIUM CHLORIDE 0.9 % IV SOLN: 1.0000 g | Freq: Three times a day (TID) | INTRAVENOUS | Status: DC

## 2016-02-14 MED ORDER — SODIUM CHLORIDE 0.9 % IV SOLN: 500.0000 mg | INTRAVENOUS | Status: DC

## 2016-02-14 MED ORDER — ZOLPIDEM TARTRATE 5 MG PO TABS: 5.0000 mg | ORAL_TABLET | Freq: Every evening | ORAL | Status: DC | PRN

## 2016-02-14 MED ORDER — METOPROLOL TARTRATE 25 MG PO TABS: 12.5000 mg | ORAL_TABLET | Freq: Two times a day (BID) | ORAL | Status: DC

## 2016-02-14 MED ORDER — HYDROCODONE-ACETAMINOPHEN 5-325 MG PO TABS: 2.0000 | ORAL_TABLET | ORAL | Status: DC | PRN

## 2016-02-14 MED ORDER — ALPRAZOLAM 0.25 MG PO TABS: 0.2500 mg | ORAL_TABLET | Freq: Three times a day (TID) | ORAL | Status: DC | PRN

## 2016-02-14 NOTE — Clinical Social Work Placement (Signed)
   CLINICAL SOCIAL WORK PLACEMENT  NOTE  Date:  02/14/2016  Patient Details  Name: Katherine Wood MRN: [id number] Date of Birth: 1941/01/12  Clinical Social Work is seeking post-discharge placement for this patient at the Skilled  Nursing Facility level of care (*CSW will initial, date and re-position this form in  chart as items are completed):  Yes   Patient/family provided with [facility] Clinical Social Work Department's list of facilities offering this level of care within the geographic area requested by the patient (or if unable, by the patient's family).  Yes   Patient/family informed of their freedom to choose among providers that offer the needed level of care, that participate in Medicare, Medicaid or managed care program needed by the patient, have an available bed and are willing to accept the patient.  Yes   Patient/family informed of [facility]'s ownership interest in [facility] and [facility], as well as of the fact that they are under no obligation to receive care at these facilities.  PASRR submitted to EDS on 01/19/16     PASRR number received on       Existing PASRR number confirmed on       FL2 transmitted to all facilities in geographic area requested by pt/family on 01/19/16     FL2 transmitted to all facilities within larger geographic area on       Patient informed that his/her managed care company has contracts with or will negotiate with certain facilities, including the following:        Yes   Patient/family informed of bed offers received.  Patient chooses bed at  [facility])     Physician recommends and patient chooses bed at  [facility](SNF)    Patient to be transferred to  [facility]) on 02/14/16.  Patient to be transferred to facility by  (EMS)     Patient family notified on 02/14/16 of transfer.  Name of family member notified:   (husband)     PHYSICIAN       Additional Comment:    _______________________________________________ York SpanielMonica  Azarius Lambson, LCSW 02/14/2016, 10:34 AM

## 2016-02-14 NOTE — Consult Note (Signed)
PARENTERAL NUTRITION CONSULT NOTE - FOLLOW UP   Pharmacy Consult for electrolytes/glucose Indication: TPN  Allergies  Allergen Reactions  . Galantamine Other (See Comments)   Patient Measurements: Height: 5\' 7"  (170.2 cm) Weight: 108 lb (48.988 kg) IBW/kg (Calculated) : 61.6  Labs: No results for input(s): WBC, HGB, HCT, PLT, APTT, INR in the last 72 hours.  Recent Labs  02/12/16 0528 02/13/16 0449 02/14/16 0450  NA 137 137  --   K 4.2 3.9 4.0  CL 104 104  --   CO2 28 27  --   GLUCOSE 116* 129*  --   BUN 25* 22*  --   CREATININE 0.64 0.57  --   CALCIUM 8.0* 7.9*  --   MG 1.8 1.9 2.0  PHOS  --  3.0  --    Estimated Creatinine Clearance: 48.4 mL/min (by C-G formula based on Cr of 0.57).    Recent Labs  02/13/16 2334 02/14/16 0419 02/14/16 0731  GLUCAP 108* 117* 123*   Insulin Requirements in the past 24 hours:  3 units (SSI q4h)  Current Nutrition:  Clinimix E 5/20 started 01/28/16, now at 30 ml/hr with diet orders  Assessment: K, Mg and glucose WNL. Patient to discharge today.   Plan:  No supplementation at this time  Pharmacy will continue to monitor and adjust per consult.   Garlon HatchetJody Belma Dyches, PharmD Clinical Pharmacist   02/14/2016 1:02 PM

## 2016-02-14 NOTE — Clinical Social Work Note (Signed)
[facility]. Ann, the Director of Nursing on the [facility], has requested discharge summary faxed to two separate faxes which CSW has done. Patient to transport via EMS. Nurse to call report. [facility] has VRE. York SpanielMonica Jeno Calleros MSW,LCSW [phone]

## 2016-02-14 NOTE — Discharge Summary (Signed)
Patient ID: Katherine Wood MRN: [id number] DOB/AGE: May 02, 1941 75 y.o.  Admit date: 01/18/2016 Discharge date: 02/14/2016  Discharge Diagnoses:  Ruptured sigmoid diverticulitis and multiple abdominal abscesses  Procedures Performed: Patient underwent exploratory laparotomy for ruptured sigmoid diverticulitis on day of admission. She then had a return to the operating room for drainage of intra-abdominal abscesses approximately 10 days later  Discharged Condition: good  Hospital Course: Patient admitted from the emergency department with ruptured diverticulitis. Taken to the operating room emergently where she had an end colostomy performed. She tolerated the procedure well. Her postoperative course was complicated by multiple intra-abdominal abscesses that were drained both percutaneously and operatively. She had gradual return of her appetite and was able have TPN weaned off prior to transfer to skilled nursing facility. She had internal medicine and infectious disease consult to help with management of her medical problems. She was put in a prolonged course of IV antibiotics due to cultures growing VRE and Pseudomonas. Patient was tolerating a diet, had open midline wound that was healing well with daily dressing care, had a closed suction drain to the right upper quadrant, had a functioning left lower quadrant end colostomy at the time of discharge to skilled nursing facility.  Discharge Orders:  discharge to skilled nursing facility   Disposition: Skilled nursing facility  Discharge Medications:   Medication List    TAKE these medications        Acetaminophen 500 MG coapsule  Take 1 capsule (500 mg total) by mouth every 4 (four) hours as needed for fever.     ALPRAZolam 0.25 MG tablet  Commonly known as:  XANAX  Take 1 tablet (0.25 mg total) by mouth 3 (three) times daily as needed for anxiety or sleep.     calcium carbonate 600 MG Tabs tablet  Commonly known as:  OS-CAL   Take 1 tablet by mouth daily.     DAPTOmycin 500 mg in sodium chloride 0.9 % 100 mL  Inject 500 mg into the vein daily.     donepezil 10 MG tablet  Commonly known as:  ARICEPT  Take 10 mg by mouth at bedtime.     HYDROcodone-acetaminophen 5-325 MG tablet  Commonly known as:  NORCO/VICODIN  Take 2 tablets by mouth every 4 (four) hours as needed for moderate pain.     loratadine 10 MG tablet  Commonly known as:  CLARITIN  Take 1 tablet by mouth daily as needed for allergies.     meloxicam 15 MG tablet  Commonly known as:  MOBIC  Take 1 tablet (15 mg total) by mouth daily.     meropenem 1 g in sodium chloride 0.9 % 100 mL  Inject 1 g into the vein every 8 (eight) hours.     metoprolol tartrate 25 MG tablet  Commonly known as:  LOPRESSOR  Take 0.5 tablets (12.5 mg total) by mouth 2 (two) times daily.     Vitamin D3 1000 units Caps  Take 1,000 Units by mouth daily.     zolpidem 5 MG tablet  Commonly known as:  AMBIEN  Take 1 tablet (5 mg total) by mouth at bedtime as needed for sleep.         Follwup: Follow-up Information    Follow up with [facility] .   Specialty:  Skilled Nursing Facility   Contact information:   [address] [place] [address] [phone]      Follow up with Leafy Ro, MD In 3  days.   Specialty:  General Surgery   Why:  For wound re-check and possible drain removal   Contact information:   [address] [place] [address] [phone]       Signed: Ricarda FrameCharles Ricketta Colantonio 02/14/2016, 9:12 AM

## 2016-02-14 NOTE — Final Progress Note (Signed)
13 Days Post-Op   Subjective:  Patient doing much better. Is without any complaints today. Tolerating all of her diet.  Vital signs in last 24 hours: Temp:  [98.1 F (36.7 C)-98.3 F (36.8 C)] 98.1 F (36.7 C) (04/22 0548) Pulse Rate:  [116] 116 (04/21 2004) Resp:  [16-20] 20 (04/22 0548) BP: (119)/(66-71) 119/71 mmHg (04/22 0548) SpO2:  [97 %-98 %] 97 % (04/22 0548) Weight:  [48.988 kg (108 lb)] 48.988 kg (108 lb) (04/22 0517) Last BM Date: 02/13/16  Intake/Output from previous day: 04/21 0701 - 04/22 0700 In: 1422 [P.O.:800; I.V.:300; TPN:322] Out: 3785 [Urine:2750; Drains:10; Stool:1025]  GI: Abdomen soft and nontender. Midline wound with excellent granulation tissue without evidence of erythema or purulence. Right upper quadrant close suction drain draining a dark bilious fluid without evidence of purulence or erythema at the insertion site. An colostomy present in the left lower quadrant that is pink, patent, productive of stool and gas.  Lab Results:  CBC No results for input(s): WBC, HGB, HCT, PLT in the last 72 hours. CMP     Component Value Date/Time   NA 137 02/13/2016 0449   NA 144 09/11/2013   K 4.0 02/14/2016 0450   CL 104 02/13/2016 0449   CO2 27 02/13/2016 0449   GLUCOSE 129* 02/13/2016 0449   BUN 22* 02/13/2016 0449   BUN 22* 09/11/2013   CREATININE 0.57 02/13/2016 0449   CALCIUM 7.9* 02/13/2016 0449   PROT 6.5 02/04/2016 1530   ALBUMIN 2.1* 02/04/2016 1530   AST 21 02/04/2016 1530   ALT 13* 02/04/2016 1530   ALKPHOS 93 02/04/2016 1530   BILITOT 0.6 02/04/2016 1530   GFRNONAA >60 02/13/2016 0449   GFRAA >60 02/13/2016 0449   PT/INR No results for input(s): LABPROT, INR in the last 72 hours.  Studies/Results: No results found.  Assessment/Plan: 75 year old female status post export her laparotomy with end colostomy for ruptured sigmoid diverticulitis. Has had drainage of multiple intra-abdominal abscesses that grew both VRE and Pseudomonas.  Appreciate internal medicine and infectious disease management of these problems. She'll need to continue daily dry dressing changes to her midline abdominal wound as this will heal by secondary intention. She'll need to continue close suction drain to the right upper quadrant until the output is 0. Her an colostomy is likely permanent and will need colostomy care. Per infectious disease patient is to continue on IV antibiotics until May 5. Appropriate prescriptions written. Plan for discharge to skilled nursing facility later today.   Ricarda Frameharles Dene Landsberg, MD FACS General Surgeon  02/14/2016

## 2016-02-14 NOTE — Discharge Instructions (Signed)
Colostomy Home Guide °A colostomy is an opening for stool to leave your body when a medical condition prevents it from leaving through the usual opening (rectum). During a surgery, a piece of large intestine (colon) is brought through a hole in the abdominal wall. The new opening is called a stoma or ostomy. A bag or pouch fits over the stoma to catch stool and gas. Your stool may be liquid, somewhat pasty, or formed. °CARING FOR YOUR STOMA  °Normally, the stoma looks a lot like the inside of your cheek: pink, red, and moist. At first it may be swollen, but this swelling will decrease within 6 weeks. °Keep the skin around your stoma clean and dry. You can gently wash your stoma and the skin around your stoma in the shower with a clean, soft washcloth. If you develop any skin irritation, your caregiver may give you a stoma powder or ointment to help heal the area. Do not use any products other than those specifically given to you by your caregiver.  °Your stoma should not be uncomfortable. If you notice any stinging or burning, your pouch may be leaking, and the skin around your stoma may be coming into contact with stool. This can cause skin irritation. If you notice stinging, replace your pouch with a new one and discard the old one. °OSTOMY POUCHES  °The pouch that fits over the ostomy can be made up of either 1 or 2 pieces. A one-piece pouch has a skin barrier piece and the pouch itself in one unit. A two-piece pouch has a skin barrier with a separate pouch that snaps on and off of the skin barrier. Either way, you should empty the pouch when it is only  to ½ full. Do not let more stool or gas build up. This could cause the pouch to leak. °Some ostomy bags have a built-in gas release valve. Ostomy deodorizer (5 drops) can be put into the pouch to prevent odor. Some people use ostomy lubricant drops inside the pouch to help the stool slide out of the bag more easily and completely.  °EMPTYING YOUR OSTOMY POUCH    °You may get lessons on how to empty your pouch from a wound-ostomy nurse before you leave the hospital. Here are the basic steps: °· Wash your hands with soap and water. °· Sit far back on the toilet. °· Put several pieces of toilet paper into the toilet water. This will prevent splashing as you empty the stool into the toilet bowl. °· Unclip or unvelcro the tail end of the pouch. °· Unroll the tail and empty stool into the toilet. °· Clean the tail with toilet paper. °· Reroll the tail, and clip or velcro it closed. °· Wash your hands again. °CHANGING YOUR OSTOMY POUCH  °Change your ostomy pouch about every 3 to 4 days for the first 6 weeks, then every 5 to7 days. Always change the bag sooner if there is any leakage or you begin to notice any discomfort or irritation of the skin around the stoma. When possible, plan to change your ostomy pouch before eating or drinking as this will lessen the chance of stool coming out during the pouch change. A wound-ostomy nurse may teach you how to change your pouch before you leave the hospital. Here are the basic steps: °· Lay out your supplies. °· Wash your hands with soap and water. °· Carefully remove the old pouch. °· Wash the stoma and allow it to dry. Men may be   advised to shave any hair around the stoma very carefully. This will make the adhesive stick better. °· Use the stoma measuring guide that comes with your pouch set to decide what size hole you will need to cut in the skin barrier piece. Choose the smallest possible size that will hold the stoma but will not touch it. °· Use the guide to trace the circle on the back of the skin barrier piece. Cut out the hole. °· Hold the skin barrier piece over the stoma to make sure the hole is the correct size. °· Remove the adhesive paper backing from the skin barrier piece. °· Squeeze stoma paste around the opening of the skin barrier piece. °· Clean and dry the skin around the stoma again. °· Carefully fit the skin  barrier piece over your stoma. °· If you are using a two-piece pouch, snap the pouch onto the skin barrier piece. °· Close the tail of the pouch. °· Put your hand over the top of the skin barrier piece to help warm it for about 5 minutes, so that it conforms to your body better. °· Wash your hands again. °DIET TIPS  °· Continue to follow your usual diet. °· Drink about eight 8 oz glasses of water each day. °· You can prevent gas by eating slowly and chewing your food thoroughly. °· If you feel concerned that you have too much gas, you can cut back on gas-producing foods, such as: °¨ Spicy foods. °¨ Onions and garlic. °¨ Cruciferous vegetables (cabbage, broccoli, cauliflower, Brussels sprouts). °¨ Beans and legumes. °¨ Some cheeses. °¨ Eggs. °¨ Fish. °¨ Bubbly (carbonated) drinks. °¨ Chewing gum. °GENERAL TIPS  °· You can shower with or without the bag in place. °· Always keep the bag on if you are bathing or swimming. °· If your bag gets wet, you can dry it with a blow-dryer set to cool. °· Avoid wearing tight clothing directly over your stoma so that it does not become irritated or bleed. Tight clothing can also prevent stool from draining into the pouch. °· It is helpful to always have an extra skin barrier and pouch with you when traveling. Do not leave them anywhere too warm, as parts of them can melt. °· Do not let your seat belt rest on your stoma. Try to keep the seat belt either above or below your stoma, or use a tiny pillow to cushion it. °· You can still participate in sports, but you should avoid activities in which there is a risk of getting hit in the abdomen. °· You can still have sex. It is a good idea to empty your pouch prior to sex. Some people and their partners feel very comfortable seeing the pouch during sex. Others choose to wear lingerie or a T-shirt that covers the device. °SEEK IMMEDIATE MEDICAL CARE IF: °· You notice a change in the size or color of the stoma, especially if it becomes  very red, purple, black, or pale white. °· You have bloody stools or bleeding from the stoma. °· You have abdominal pain, nausea, vomiting, or bloating. °· There is anything unusual protruding from the stoma. °· You have irritation or red skin around the stoma. °· No stool is passing from the stoma. °· You have diarrhea (requiring more frequent than normal pouch emptying). °  °This information is not intended to replace advice given to you by your health care provider. Make sure you discuss any questions you have with your health care   provider.   Document Released: 10/14/2003 Document Revised: 01/03/2012 Document Reviewed: 03/10/2011 Elsevier Interactive Patient Education 2016 Else[employer] Inc[employer] Bulb Drain Home Care A bulb drain consists of a thin rubber tube and a soft, round bulb that creates a gentle suction. The rubber tube is placed in the area where you had surgery. A bulb is attached to the end of the tube that is outside the body. The bulb drain removes excess fluid that normally builds up in a surgical wound after surgery. The color and amount of fluid will vary. Immediately after surgery, the fluid is bright red and is a little thicker than water. It may gradually change to a yellow or pink color and become more thin and water-like. When the amount decreases to about 1 or 2 tbsp in 24 hours, your health care provider will usually remove it. DAILY CARE  Keep the bulb flat (compressed) at all times, except while emptying it. The flatness creates suction. You can flatten the bulb by squeezing it firmly in the middle and then closing the cap.  Keep sites where the tube enters the skin dry and covered with a bandage (dressing).  Secure the tube 1-2 in (2.5-5.1 cm) below the insertion sites to keep it from pulling on your stitches. The tube is stitched in place and will not slip out.  Secure the bulb as directed by your health care provider.  For the first 3 days after surgery, there usually is more  fluid in the bulb. Empty the bulb whenever it becomes half full because the bulb does not create enough suction if it is too full. The bulb could also overflow. Write down how much fluid you remove each time you empty your drain. Add up the amount removed in 24 hours.  Empty the bulb at the same time every day once the amount of fluid decreases and you only need to empty it once a day. Write down the amounts and the 24-hour totals to give to your health care provider. This helps your health care provider know when the tubes can be removed. EMPTYING THE BULB DRAIN Before emptying the bulb, get a measuring cup, a piece of paper and a pen, and wash your hands.  Gently run your fingers down the tube (stripping) to empty any drainage from the tubing into the bulb. This may need to be done several times a day to clear the tubing of clots and tissue.  Open the bulb cap to release suction, which causes it to inflate. Do not touch the inside of the cap.  Gently run your fingers down the tube (stripping) to empty any drainage from the tubing into the bulb.  Hold the cap out of the way, and pour fluid into the measuring cup.   Squeeze the bulb to provide suction.  Replace the cap.   Check the tape that holds the tube to your skin. If it is becoming loose, you can remove the loose piece of tape and apply a new one. Then, pin the bulb to your shirt.   Write down the amount of fluid you emptied out. Write down the date and each time you emptied your bulb drain. (If there are 2 bulbs, note the amount of drainage from each bulb and keep the totals separate. Your health care provider will want to know the total amounts for each drain and which tube is draining more.)   Flush the fluid down the toilet and wash your hands.   Call your health care  provider once you have less than 2 tbsp of fluid collecting in the bulb drain every 24 hours. If there is drainage around the tube site, change dressings and  keep the area dry. Cleanse around tube with sterile saline and place dry gauze around site. This gauze should be changed when it is soiled. If it stays clean and unsoiled, it should still be changed daily.  SEEK MEDICAL CARE IF:  Your drainage has a bad smell or is cloudy.   You have a fever.   Your drainage is increasing instead of decreasing.   Your tube fell out.   You have redness or swelling around the tube site.   You have drainage from a surgical wound.   Your bulb drain will not stay flat after you empty it.  MAKE SURE YOU:   Understand these instructions.  Will watch your condition.  Will get help right away if you are not doing well or get worse.   This information is not intended to replace advice given to you by your health care provider. Make sure you discuss any questions you have with your health care provider.   Document Released: 10/08/2000 Document Revised: 11/01/2014 Document Reviewed: 04/30/2015 Elsevier Interactive Patient Education 2016 Elsevier Inc[employer] Dressing Change A dressing is a material placed over wounds. It keeps the wound clean, dry, and protected from further injury. This provides an environment that favors wound healing.  BEFORE YOU BEGIN  Get your supplies together. Things you may need include:  Saline solution.  Flexible gauze dressing.  Medicated cream.  Tape.  Gloves.  Abdominal dressing pads.  Gauze squares.  Plastic bags.  Take pain medicine 30 minutes before the dressing change if you need it.  Take a shower before you do the first dressing change of the day. Use plastic wrap or a plastic bag to prevent the dressing from getting wet. REMOVING YOUR OLD DRESSING   Wash your hands with soap and water. Dry your hands with a clean towel.  Put on your gloves.  Remove any tape.  Carefully remove the old dressing. If the dressing sticks, you may dampen it with warm water to loosen it, or follow your caregiver's  specific directions.  Remove any gauze or packing tape that is in your wound.  Take off your gloves.  Put the gloves, tape, gauze, or any packing tape into a plastic bag. CHANGING YOUR DRESSING  Open the supplies.  Take the cap off the saline solution.  Open the gauze package so that the gauze remains on the inside of the package.  Put on your gloves.  Clean your wound as told by your caregiver.  If you have been told to keep your wound dry, follow those instructions.  Your caregiver may tell you to do one or more of the following:  Pick up the gauze. Pour the saline solution over the gauze. Squeeze out the extra saline solution.  Put medicated cream or other medicine on your wound if you have been told to do so.  Put the solution soaked gauze only in your wound, not on the skin around it.  Pack your wound loosely or as told by your caregiver.  Put dry gauze on your wound.  Put abdominal dressing pads over the dry gauze if your wet gauze soaks through.  Tape the abdominal dressing pads in place so they will not fall off. Do not wrap the tape completely around the affected part (arm, leg, abdomen).  Wrap the dressing pads  with a flexible gauze dressing to secure it in place.  Take off your gloves. Put them in the plastic bag with the old dressing. Tie the bag shut and throw it away.  Keep the dressing clean and dry until your next dressing change.  Wash your hands. SEEK MEDICAL CARE IF:  Your skin around the wound looks red.  Your wound feels more tender or sore.  You see pus in the wound.  Your wound smells bad.  You have a fever.  Your skin around the wound has a rash that itches and burns.  You see black or yellow skin in your wound that was not there before.  You feel nauseous, throw up, and feel very tired.   This information is not intended to replace advice given to you by your health care provider. Make sure you discuss any questions you have with  your health care provider.   Document Released: 11/18/2004 Document Revised: 01/03/2012 Document Reviewed: 08/23/2011 Elsevier Interactive Patient Education [employer]

## 2016-02-17 ENCOUNTER — Encounter: Payer: Self-pay | Admitting: Surgery

## 2016-02-17 ENCOUNTER — Telehealth: Payer: Self-pay | Admitting: Surgery

## 2016-02-17 ENCOUNTER — Other Ambulatory Visit: Payer: Self-pay

## 2016-02-17 ENCOUNTER — Ambulatory Visit (INDEPENDENT_AMBULATORY_CARE_PROVIDER_SITE_OTHER): Payer: Medicare Other | Admitting: Surgery

## 2016-02-17 VITALS — BP 129/77 | HR 109 | Temp 97.7°F | Wt 108.0 lb

## 2016-02-17 DIAGNOSIS — Z09 Encounter for follow-up examination after completed treatment for conditions other than malignant neoplasm: Secondary | ICD-10-CM

## 2016-02-17 NOTE — Telephone Encounter (Signed)
Spoke with Victorino DikeJennifer @ [facility] regarding Katherine Wood's JP drain. We discussed that since there was still fluid coming from the drain that Dr.Pabon did not want to remove the drain at this time.

## 2016-02-17 NOTE — Patient Instructions (Signed)
Please see follow up appointment listed below.

## 2016-02-17 NOTE — Progress Notes (Signed)
Mrs. Katherine Wood is a 75 year old well known to me status post structure laparotomy and Hartmann's procedure, located but multiple intra-abdominal abscesses requiring percutaneous drainage and also an open drainage. And she is demented and debilitated and currently lives in nursing home. And the drain has put out less than 15 ML's over 24 hours. Not she is taking regular diet and is walking. And she has had a good improvement over last few days. And continues to be on meropenem and after 4 Pseudomonas and VRE respectively.  PE NAD alert Abd: soft, NT, midline incisions healing very well w good granulation tissue. Perc drain w some scant bilious content. No peritonitis  A/P doing well Keep Drain until zero output Continue A/Bs per ID recs RTC 2 weeks and will reassess removal of drain at that time.

## 2016-02-17 NOTE — Telephone Encounter (Signed)
Victorino DikeJennifer from the [facility] called regarding patient. She was wondering why the JP drain wasn't removed?

## 2016-02-18 ENCOUNTER — Encounter: Payer: Medicare Other | Admitting: Surgery

## 2016-02-25 DIAGNOSIS — A491 Streptococcal infection, unspecified site: Secondary | ICD-10-CM

## 2016-02-25 DIAGNOSIS — Z1621 Resistance to vancomycin: Secondary | ICD-10-CM

## 2016-02-25 DIAGNOSIS — IMO0002 Reserved for concepts with insufficient information to code with codable children: Secondary | ICD-10-CM | POA: Insufficient documentation

## 2016-02-25 DIAGNOSIS — K5732 Diverticulitis of large intestine without perforation or abscess without bleeding: Secondary | ICD-10-CM | POA: Insufficient documentation

## 2016-02-25 HISTORY — DX: Streptococcal infection, unspecified site: A49.1

## 2016-03-01 ENCOUNTER — Telehealth: Payer: Self-pay | Admitting: Family Medicine

## 2016-03-01 NOTE — Telephone Encounter (Signed)
Pt's daughter Gunnar Fusiaula would like Dr. Elease HashimotoMaloney to return her call. Gunnar Fusiaula doesn't want to get any information she would like to speak with Dr. Elease HashimotoMaloney to update her about pt's recent medical information. Gunnar Fusiaula stated that pt recently had surgery and would like to update Dr. Elease HashimotoMaloney about the surgery. Thanks TNP

## 2016-03-01 NOTE — Telephone Encounter (Signed)
Mom is having anxiety and panic attacks.   Does not remember in a day.  They want her on an anti-depressant. Is under a lot of stress. Very concerned. Family wants her on medication.  Thanks.

## 2016-03-02 ENCOUNTER — Other Ambulatory Visit: Payer: Self-pay

## 2016-03-02 ENCOUNTER — Ambulatory Visit (INDEPENDENT_AMBULATORY_CARE_PROVIDER_SITE_OTHER): Payer: Medicare Other | Admitting: Family Medicine

## 2016-03-02 ENCOUNTER — Encounter: Payer: Self-pay | Admitting: Family Medicine

## 2016-03-02 VITALS — BP 102/50 | HR 96 | Temp 97.6°F | Resp 16 | Wt 116.0 lb

## 2016-03-02 DIAGNOSIS — F411 Generalized anxiety disorder: Secondary | ICD-10-CM

## 2016-03-02 DIAGNOSIS — R000 Tachycardia, unspecified: Secondary | ICD-10-CM

## 2016-03-02 DIAGNOSIS — K5720 Diverticulitis of large intestine with perforation and abscess without bleeding: Secondary | ICD-10-CM | POA: Diagnosis not present

## 2016-03-02 DIAGNOSIS — D649 Anemia, unspecified: Secondary | ICD-10-CM | POA: Diagnosis not present

## 2016-03-02 MED ORDER — ESCITALOPRAM OXALATE 10 MG PO TABS: 10.0000 mg | ORAL_TABLET | Freq: Every day | ORAL | Status: DC

## 2016-03-02 NOTE — Progress Notes (Signed)
Subjective:    Patient ID: Katherine Wood, female    DOB: 03-14-1941, 75 y.o.   MRN: [id number]  HPI   Follow up Hospitalization  Patient [facility] and discharged on 02/14/2016. She was treated for ruptured sigmoid diverticulosis and multiple abdominal abscesses. Treatment for this included exploratory laparotomy, drainage for intraabdominal abscesses. Pt was D/C to skilled nursing facility. She reports good compliance with treatment. She reports this condition is Improved. Pt report she feels 50% improved. Pt is eating 3 meals a day, as well as snacks. BM are normal per pt.  ------------------------------------------------------------------------------------ Anxiety Per daughter Gunnar Fusiaula, pt is having anxiety and panic attacks. Daughter wishes for to pt start on medication. See phone message from 03/01/2016. Pt does admit to some depression secondary to hospitalization and health maintenance.   Review of Systems  Constitutional: Positive for activity change, appetite change and fatigue. Negative for fever, chills and diaphoresis.  Respiratory: Negative for cough.   Cardiovascular: Positive for leg swelling. Negative for chest pain and palpitations.  Gastrointestinal: Positive for abdominal pain. Negative for nausea and vomiting.   BP 102/50 mmHg  Pulse 96  Temp(Src) 97.6 F (36.4 C) (Oral)  Resp 16  Wt 116 lb (52.617 kg)   Patient Active Problem List   Diagnosis Date Noted  . Abdominal abscess (HCC) 02/25/2016  . Diverticulitis large intestine 02/25/2016  . Enterococcus, vancomycin-resistant 02/25/2016  . Abscess of abdominal cavity (HCC)   . Protein-calorie malnutrition, severe 01/30/2016  . Diverticulitis of colon with perforation 01/19/2016  . Bowel perforation (HCC)   . Hernia of abdominal cavity   . Dementia in Alzheimer's disease with late onset 07/27/2015  . Memory loss 05/16/2015  . Arthritis 02/13/2015  . Narrowing of intervertebral  disc space 02/13/2015  . Clinical depression 02/13/2015  . Hypercholesteremia 02/13/2015  . Amnesia 02/13/2015  . Symptomatic menopausal or female climacteric states 02/13/2015  . Arthritis, degenerative 02/13/2015  . Avitaminosis D 02/13/2015  . Connective tissue and disc stenosis of intervertebral foramina of abdomen and other regions 02/13/2015  . Bone/cartilage disorder 08/15/2009  . Anxiety state 10/31/2008  . Arthropathia 10/30/2008  . Leg varices 10/30/2008  . Acquired cyst of kidney 10/30/2008  . Cannot sleep 10/30/2008  . OP (osteoporosis) 10/30/2008  . Cystocele, midline 10/30/2008  . Involutional osteoporosis 10/30/2008   Past Medical History  Diagnosis Date  . Allergy   . Depression   . Hyperlipidemia   . Anxiety   . Osteoporosis    Current Outpatient Prescriptions on File Prior to Visit  Medication Sig  . ALPRAZolam (XANAX) 0.25 MG tablet Take 1 tablet (0.25 mg total) by mouth 3 (three) times daily as needed for anxiety or sleep.  Marland Kitchen. donepezil (ARICEPT) 10 MG tablet Take 10 mg by mouth at bedtime.   Marland Kitchen. HYDROcodone-acetaminophen (NORCO/VICODIN) 5-325 MG tablet Take 2 tablets by mouth every 4 (four) hours as needed for moderate pain.  Marland Kitchen. zolpidem (AMBIEN) 5 MG tablet Take 1 tablet (5 mg total) by mouth at bedtime as needed for sleep.  . Acetaminophen 500 MG coapsule Take 1 capsule (500 mg total) by mouth every 4 (four) hours as needed for fever. (Patient not taking: Reported on 03/02/2016)  . calcium carbonate (OS-CAL) 600 MG TABS tablet Take 1 tablet by mouth daily. Reported on 03/02/2016  . Calcium-Vitamin D 600-200 MG-UNIT tablet Take 1 tablet by mouth 2 (two) times daily. Reported on 03/02/2016  . Cholecalciferol (VITAMIN D3) 1000 units CAPS Take 1,000 Units by  mouth daily. Reported on 03/02/2016  . DAPTOmycin 500 mg in sodium chloride 0.9 % 100 mL Inject 500 mg into the vein daily. (Patient not taking: Reported on 03/02/2016)  . loratadine (CLARITIN) 10 MG tablet Take 1 tablet  by mouth daily as needed for allergies. Reported on 03/02/2016  . meloxicam (MOBIC) 15 MG tablet Take 1 tablet (15 mg total) by mouth daily. (Patient not taking: Reported on 03/02/2016)  . meropenem 1 g in sodium chloride 0.9 % 100 mL Inject 1 g into the vein every 8 (eight) hours. (Patient not taking: Reported on 03/02/2016)  . metoprolol tartrate (LOPRESSOR) 25 MG tablet Take 0.5 tablets (12.5 mg total) by mouth 2 (two) times daily. (Patient not taking: Reported on 03/02/2016)   No current facility-administered medications on file prior to visit.   Allergies  Allergen Reactions  . Galantamine Other (See Comments)   Past Surgical History  Procedure Laterality Date  . Cataract extraction Bilateral 08/2012    [facility]- Dr. Jettie Pagan  . Abdominal hysterectomy  1989  . Laparotomy N/A 01/18/2016    Procedure: EXPLORATORY LAPAROTOMY, LYSIS OF ADHESIONS, DRAINAGE OF INTRABDOMINAL ABSCESS, HARTMAN'S PROCEDURE, UMBILICAL HERNIA REPAIR ;  Surgeon: Leafy Ro, MD;  Location: [facility] ORS;  Service: General;  Laterality: N/A;  . Laparoscopy N/A 02/01/2016    Procedure: Attempted laparoscopic,;  Surgeon: Leafy Ro, MD;  Location: [facility] ORS;  Service: General;  Laterality: N/A;  . Incision and drainage abscess N/A 02/01/2016    Procedure: INCISION AND DRAINAGE ABSCESS-OPEN;  Surgeon: Leafy Ro, MD;  Location: [facility] ORS;  Service: General;  Laterality: N/A;  . Laparotomy N/A 02/01/2016    Procedure: EXPLORATORY LAPAROTOMY , drainage of abscess, lysis of ahesions;  Surgeon: Leafy Ro, MD;  Location: [facility] ORS;  Service: General;  Laterality: N/A;   Social History   Social History  . Marital Status: Married    Spouse Name: Renae Fickle  . Number of Children: 2  . Years of Education: N/A   Occupational History  . Not on file.   Social History Main Topics  . Smoking status: Never Smoker   . Smokeless tobacco: Never Used  . Alcohol Use: No  . Drug Use: No  . Sexual Activity: Not on file   Other  Topics Concern  . Not on file   Social History Narrative   Family History  Problem Relation Age of Onset  . Hypertension Mother   . Parkinsonism Mother   . Heart disease Father   . Coronary artery disease Brother   . Heart attack Brother       Objective:   Physical Exam  Cardiovascular: Regular rhythm and normal heart sounds.   Pulmonary/Chest: Effort normal and breath sounds normal. No respiratory distress.  Abdominal:  Cental bandage colostomy bag is present on the left, and drain is present on the right.  Musculoskeletal:  Bandage for picc line on right upper arm.  BP 102/50 mmHg  Pulse 96  Temp(Src) 97.6 F (36.4 C) (Oral)  Resp 16  Wt 116 lb (52.617 kg)     Assessment & Plan:  1. Diverticulitis of colon with perforation FU with surgery as scheduled. Continue antibiotics.    2. Anxiety state Worsening with recent health issues. Start Lexapro as below. FU 1 month.  Husband to call sooner if not improving. Continue Xanax as needed.   - escitalopram (LEXAPRO) 10 MG tablet; Take 1 tablet (10 mg total) by mouth daily. Start with 1/2 tablet for  1 week.  Dispense: 30 tablet; Refill: 3  3. Anemia, unspecified anemia type Pt was anemic in hospital. Will check labs. FU pending results. - CBC with Differential/Platelet - Vitamin B12 - Comprehensive metabolic panel - TSH - Iron and TIBC  4. Tachycardia EKG normal. Will monitor. No B-blocker for now. BP too lwo.   - EKG 12-Lead    Patient seen and examined by Leo Grosser, MD, and note scribed by Allene Dillon, CMA.  I have reviewed the document for accuracy and completeness and I agree with above. Leo Grosser, MD   Lorie Phenix, MD

## 2016-03-03 LAB — COMPREHENSIVE METABOLIC PANEL
ALBUMIN: 3.4 g/dL — AB (ref 3.5–4.8)
ALK PHOS: 91 IU/L (ref 39–117)
ALT: 17 IU/L (ref 0–32)
AST: 24 IU/L (ref 0–40)
Albumin/Globulin Ratio: 1.1 — ABNORMAL LOW (ref 1.2–2.2)
BUN/Creatinine Ratio: 32 — ABNORMAL HIGH (ref 12–28)
BUN: 21 mg/dL (ref 8–27)
Bilirubin Total: 0.2 mg/dL (ref 0.0–1.2)
CO2: 21 mmol/L (ref 18–29)
CREATININE: 0.65 mg/dL (ref 0.57–1.00)
Calcium: 9.1 mg/dL (ref 8.7–10.3)
Chloride: 105 mmol/L (ref 96–106)
GFR calc Af Amer: 102 mL/min/{1.73_m2} (ref >59)
GFR calc non Af Amer: 88 mL/min/{1.73_m2} (ref >59)
GLUCOSE: 100 mg/dL — AB (ref 65–99)
Globulin, Total: 3.1 g/dL (ref 1.5–4.5)
Potassium: 4.4 mmol/L (ref 3.5–5.2)
Sodium: 143 mmol/L (ref 134–144)
Total Protein: 6.5 g/dL (ref 6.0–8.5)

## 2016-03-03 LAB — CBC WITH DIFFERENTIAL/PLATELET
Basophils Absolute: 0 10*3/uL (ref 0.0–0.2)
Basos: 1 %
EOS (ABSOLUTE): 0.1 10*3/uL (ref 0.0–0.4)
EOS: 2 %
HEMATOCRIT: 29.4 % — AB (ref 34.0–46.6)
Hemoglobin: 9.8 g/dL — ABNORMAL LOW (ref 11.1–15.9)
IMMATURE GRANULOCYTES: 1 %
Immature Grans (Abs): 0 10*3/uL (ref 0.0–0.1)
LYMPHS: 22 %
Lymphocytes Absolute: 1.1 10*3/uL (ref 0.7–3.1)
MCH: 30.2 pg (ref 26.6–33.0)
MCHC: 33.3 g/dL (ref 31.5–35.7)
MCV: 91 fL (ref 79–97)
MONOS ABS: 0.6 10*3/uL (ref 0.1–0.9)
Monocytes: 11 %
Neutrophils Absolute: 3.3 10*3/uL (ref 1.4–7.0)
Neutrophils: 63 %
PLATELETS: 314 10*3/uL (ref 150–379)
RBC: 3.24 x10E6/uL — AB (ref 3.77–5.28)
RDW: 15.3 % (ref 12.3–15.4)
WBC: 5.2 10*3/uL (ref 3.4–10.8)

## 2016-03-03 LAB — IRON AND TIBC
IRON SATURATION: 25 % (ref 15–55)
Iron: 38 ug/dL (ref 27–139)
TIBC: 155 ug/dL — AB (ref 250–450)
UIBC: 117 ug/dL — AB (ref 118–369)

## 2016-03-03 LAB — VITAMIN B12: Vitamin B-12: 531 pg/mL (ref 211–946)

## 2016-03-03 LAB — TSH: TSH: 0.911 u[IU]/mL (ref 0.450–4.500)

## 2016-03-04 ENCOUNTER — Telehealth: Payer: Self-pay

## 2016-03-04 NOTE — Telephone Encounter (Signed)
Informed Mr. Gloris HamGavasto as below. Verbally acknowledges understanding and agrees with treatment plan. Allene DillonEmily Drozdowski, CMA

## 2016-03-04 NOTE — Telephone Encounter (Signed)
-----   Message from Lorie PhenixNancy Maloney, MD sent at 03/04/2016  2:16 PM EDT ----- Low hemoglobin and protein probably related to prolonged illness. Continue to eat well as she has been doing and recheck CBC in 4 weeks. Thanks.

## 2016-03-04 NOTE — Progress Notes (Signed)
Mr. Katherine Wood informed. Katherine Wood, CMA

## 2016-03-05 ENCOUNTER — Encounter: Payer: Self-pay | Admitting: Infectious Diseases

## 2016-03-05 DIAGNOSIS — Z1621 Resistance to vancomycin: Secondary | ICD-10-CM

## 2016-03-05 DIAGNOSIS — A491 Streptococcal infection, unspecified site: Secondary | ICD-10-CM | POA: Insufficient documentation

## 2016-03-10 ENCOUNTER — Ambulatory Visit: Payer: Medicare Other | Admitting: Family Medicine

## 2016-03-11 ENCOUNTER — Ambulatory Visit (INDEPENDENT_AMBULATORY_CARE_PROVIDER_SITE_OTHER): Payer: Medicare PPO | Admitting: Surgery

## 2016-03-11 ENCOUNTER — Encounter: Payer: Self-pay | Admitting: Surgery

## 2016-03-11 VITALS — BP 101/67 | HR 98 | Temp 97.9°F | Wt 115.0 lb

## 2016-03-11 DIAGNOSIS — Z09 Encounter for follow-up examination after completed treatment for conditions other than malignant neoplasm: Secondary | ICD-10-CM

## 2016-03-11 NOTE — Progress Notes (Signed)
S/p [facility] for perforated diverticulitis and peritonitis on debilitated female w dementia Developed multiple intra-abdominal abscess VRE and pseudomonas requiring perc drains and open drainage Doing well, afebrile WBC nml, tolerating Po and having BM. No output from drain Back to baseline  PE NAD Abd: soft, midline wound healing very well secondary intention, drain removed. NT , no infection  A/P doing well Will d/w ID regarding stopping A/Bs From my perspective no further A/Bs needed RTC prn basis

## 2016-03-15 ENCOUNTER — Telehealth: Payer: Self-pay | Admitting: Family Medicine

## 2016-03-15 DIAGNOSIS — F411 Generalized anxiety disorder: Secondary | ICD-10-CM

## 2016-03-15 MED ORDER — ALPRAZOLAM 0.5 MG PO TABS: 0.2500 mg | ORAL_TABLET | Freq: Three times a day (TID) | ORAL | Status: DC | PRN

## 2016-03-15 NOTE — Telephone Encounter (Signed)
Printed, please fax or call in to pharmacy. Thank you.   

## 2016-03-15 NOTE — Telephone Encounter (Signed)
Please call and see if would like increase to .5 mg three times daily as needed.  Please  See if taking Lexapro also. Thanks.

## 2016-03-15 NOTE — Telephone Encounter (Signed)
Pt contacted office for refill request on the following medications:  ALPRAZolam (XANAX) 0.25 MG tablet.  [facility] [address]  AV#[phone]/WGB#[phone]/MW  Pt husband Renae Fickleaul is request a dosage increase.  He is requesting a call back before Rx is sent in/MW

## 2016-03-15 NOTE — Telephone Encounter (Signed)
Katherine Wood agreed to 0.5mg  three times a day PRN. She is also taking her Lexapro every day.  Thanks,   -Vernona RiegerLaura

## 2016-03-16 NOTE — Telephone Encounter (Signed)
Has this been called in? Thanks.

## 2016-03-26 ENCOUNTER — Ambulatory Visit (INDEPENDENT_AMBULATORY_CARE_PROVIDER_SITE_OTHER): Payer: Medicare Other | Admitting: Family Medicine

## 2016-03-26 ENCOUNTER — Encounter: Payer: Self-pay | Admitting: Family Medicine

## 2016-03-26 VITALS — BP 108/60 | HR 92 | Temp 98.1°F | Resp 16 | Wt 115.0 lb

## 2016-03-26 DIAGNOSIS — F411 Generalized anxiety disorder: Secondary | ICD-10-CM | POA: Diagnosis not present

## 2016-03-26 DIAGNOSIS — M150 Primary generalized (osteo)arthritis: Secondary | ICD-10-CM

## 2016-03-26 DIAGNOSIS — M159 Polyosteoarthritis, unspecified: Secondary | ICD-10-CM

## 2016-03-26 MED ORDER — MELOXICAM 15 MG PO TABS: 15.0000 mg | ORAL_TABLET | Freq: Every day | ORAL | Status: DC

## 2016-03-26 MED ORDER — ZOLPIDEM TARTRATE 5 MG PO TABS: 5.0000 mg | ORAL_TABLET | Freq: Every evening | ORAL | Status: DC | PRN

## 2016-03-26 MED ORDER — BUSPIRONE HCL 15 MG PO TABS: 15.0000 mg | ORAL_TABLET | Freq: Two times a day (BID) | ORAL | Status: DC

## 2016-03-26 NOTE — Progress Notes (Signed)
Subjective:    Patient ID: Katherine Wood, female    DOB: Mar 24, 1941, 75 y.o.   MRN: [id number]  HPI  Follow up for Anxiety  The patient was last seen for this 1 months ago. Changes made at last visit include adding Lexapro 10 mg po qd.  She reports excellent compliance with treatment. She feels that condition is Unchanged. She is not having side effects. Husband reports the Lexapro has not improved sx. Also reports her Alprazolam is losing it's effectiveness.  She has the following symptoms: difficulty concentrating, dizziness.  Still having trouble with adjusting to her colostomy.  Makes her very upset.  Does not want to have it there. Can not remember why she has to have it.   May be cleared by her ID doctor.   Also, needs refill of her osteoarthritis medication.        Review of Systems  Constitutional: Positive for chills. Negative for fever, diaphoresis, activity change, appetite change, fatigue and unexpected weight change.  Respiratory: Negative for cough, shortness of breath and wheezing.   Cardiovascular: Negative for chest pain, palpitations and leg swelling.  Musculoskeletal: Positive for arthralgias (Requesting refill on Meloxicam).  Neurological: Positive for dizziness.  Psychiatric/Behavioral: Negative for sleep disturbance (pt needs refill on Ambien.). The patient is nervous/anxious.    BP 108/60 mmHg  Pulse 92  Temp(Src) 98.1 F (36.7 C) (Oral)  Resp 16  Wt 115 lb (52.164 kg)   Patient Active Problem List   Diagnosis Date Noted  . VRE (vancomycin-resistant Enterococci) infection 03/05/2016  . Abdominal abscess (HCC) 02/25/2016  . Diverticulitis large intestine 02/25/2016  . Enterococcus, vancomycin-resistant 02/25/2016  . Abscess of abdominal cavity (HCC)   . Protein-calorie malnutrition, severe 01/30/2016  . Diverticulitis of colon with perforation 01/19/2016  . Bowel perforation (HCC)   . Hernia of abdominal cavity   . Dementia in  Alzheimer's disease with late onset 07/27/2015  . Memory loss 05/16/2015  . Arthritis 02/13/2015  . Narrowing of intervertebral disc space 02/13/2015  . Clinical depression 02/13/2015  . Hypercholesteremia 02/13/2015  . Amnesia 02/13/2015  . Symptomatic menopausal or female climacteric states 02/13/2015  . Arthritis, degenerative 02/13/2015  . Avitaminosis D 02/13/2015  . Connective tissue and disc stenosis of intervertebral foramina of abdomen and other regions 02/13/2015  . Bone/cartilage disorder 08/15/2009  . Anxiety state 10/31/2008  . Arthropathia 10/30/2008  . Leg varices 10/30/2008  . Acquired cyst of kidney 10/30/2008  . Cannot sleep 10/30/2008  . OP (osteoporosis) 10/30/2008  . Cystocele, midline 10/30/2008  . Involutional osteoporosis 10/30/2008   Past Medical History  Diagnosis Date  . Allergy   . Depression   . Hyperlipidemia   . Anxiety   . Osteoporosis    Current Outpatient Prescriptions on File Prior to Visit  Medication Sig  . ALPRAZolam (XANAX) 0.5 MG tablet Take 0.5 tablets (0.25 mg total) by mouth 3 (three) times daily as needed for anxiety or sleep.  Marland Kitchen donepezil (ARICEPT) 10 MG tablet Take 10 mg by mouth at bedtime.   Marland Kitchen escitalopram (LEXAPRO) 10 MG tablet Take 1 tablet (10 mg total) by mouth daily. Start with 1/2 tablet for 1 week.  . zolpidem (AMBIEN) 5 MG tablet Take 1 tablet (5 mg total) by mouth at bedtime as needed for sleep.  Marland Kitchen HYDROcodone-acetaminophen (NORCO/VICODIN) 5-325 MG tablet Take 2 tablets by mouth every 4 (four) hours as needed for moderate pain. (Patient not taking: Reported on 03/26/2016)   No current facility-administered medications on file  prior to visit.   Allergies  Allergen Reactions  . Galantamine Other (See Comments)   Past Surgical History  Procedure Laterality Date  . Cataract extraction Bilateral 08/2012    [facility]- Dr. Jettie PaganEpps  . Abdominal hysterectomy  1989  . Laparotomy N/A 01/18/2016    Procedure:  EXPLORATORY LAPAROTOMY, LYSIS OF ADHESIONS, DRAINAGE OF INTRABDOMINAL ABSCESS, HARTMAN'S PROCEDURE, UMBILICAL HERNIA REPAIR ;  Surgeon: Leafy Roiego F Pabon, MD;  Location: [facility];  Service: General;  Laterality: N/A;  . Laparoscopy N/A 02/01/2016    Procedure: Attempted laparoscopic,;  Surgeon: Leafy Roiego F Pabon, MD;  Location: [facility];  Service: General;  Laterality: N/A;  . Incision and drainage abscess N/A 02/01/2016    Procedure: INCISION AND DRAINAGE ABSCESS-OPEN;  Surgeon: Leafy Roiego F Pabon, MD;  Location: [facility];  Service: General;  Laterality: N/A;  . Laparotomy N/A 02/01/2016    Procedure: EXPLORATORY LAPAROTOMY , drainage of abscess, lysis of ahesions;  Surgeon: Leafy Roiego F Pabon, MD;  Location: [facility];  Service: General;  Laterality: N/A;   Social History   Social History  . Marital Status: Married    Spouse Name: Renae Fickleaul  . Number of Children: 2  . Years of Education: N/A   Occupational History  . Not on file.   Social History Main Topics  . Smoking status: Never Smoker   . Smokeless tobacco: Never Used  . Alcohol Use: No  . Drug Use: No  . Sexual Activity: Not on file   Other Topics Concern  . Not on file   Social History Narrative   Family History  Problem Relation Age of Onset  . Hypertension Mother   . Parkinsonism Mother   . Heart disease Father   . Coronary artery disease Brother   . Heart attack Brother       Objective:   Physical Exam  Constitutional: She is oriented to person, place, and time. She appears well-developed and well-nourished.  Cardiovascular: Normal rate and regular rhythm.   Pulmonary/Chest: Effort normal and breath sounds normal.  Abdominal: Soft. Bowel sounds are normal.  Neurological: She is alert and oriented to person, place, and time.  Skin:  Well healing scar noted.    Psychiatric: She has a normal mood and affect. Her behavior is normal.  Tearful regarding colostomy.   BP 108/60 mmHg  Pulse 92  Temp(Src) 98.1 F (36.7 C) (Oral)  Resp 16   Wt 115 lb (52.164 kg)     Assessment & Plan:   1. Anxiety state Worsening. Will Add Buspar as below. D/C Xanax, as it is no longer improving sx. Refill Ambien as below. - busPIRone (BUSPAR) 15 MG tablet; Take 1 tablet (15 mg total) by mouth 2 (two) times daily. 1/3 two times daily for 3 days and then 1/2 two times daily for 3 days and 2/3 two times daily for 3 days and then 1 two times daily  Dispense: 60 tablet; Refill: 0 - zolpidem (AMBIEN) 5 MG tablet; Take 1 tablet (5 mg total) by mouth at bedtime as needed for sleep.  Dispense: 30 tablet; Refill: 5  2. Primary osteoarthritis involving multiple joints Stable. Pt has been on Mobic in the past, with relief. Refills provided. - meloxicam (MOBIC) 15 MG tablet; Take 1 tablet (15 mg total) by mouth daily.  Dispense: 30 tablet; Refill: 5    Patient seen and examined by Leo GrosserNancy J. Alianis Trimmer, MD, and note scribed by Allene DillonEmily Drozdowski, CMA.  I have reviewed the document for accuracy and  completeness and I agree with above. Jerrell Belfast, MD   Margarita Rana, MD

## 2016-04-02 ENCOUNTER — Telehealth: Payer: Self-pay | Admitting: Family Medicine

## 2016-04-02 NOTE — Telephone Encounter (Signed)
error 

## 2016-04-07 ENCOUNTER — Ambulatory Visit (INDEPENDENT_AMBULATORY_CARE_PROVIDER_SITE_OTHER): Payer: Medicare Other | Admitting: Family Medicine

## 2016-04-07 ENCOUNTER — Encounter: Payer: Self-pay | Admitting: Family Medicine

## 2016-04-07 VITALS — BP 100/66 | HR 80 | Temp 98.0°F | Resp 16 | Ht 64.0 in | Wt 112.0 lb

## 2016-04-07 DIAGNOSIS — D649 Anemia, unspecified: Secondary | ICD-10-CM | POA: Insufficient documentation

## 2016-04-07 DIAGNOSIS — D508 Other iron deficiency anemias: Secondary | ICD-10-CM

## 2016-04-07 DIAGNOSIS — Z0000 Encounter for general adult medical examination without abnormal findings: Secondary | ICD-10-CM | POA: Diagnosis not present

## 2016-04-07 DIAGNOSIS — F411 Generalized anxiety disorder: Secondary | ICD-10-CM

## 2016-04-07 MED ORDER — ESCITALOPRAM OXALATE 20 MG PO TABS: 20.0000 mg | ORAL_TABLET | Freq: Every day | ORAL | Status: DC

## 2016-04-07 MED ORDER — LORAZEPAM 1 MG PO TABS: 1.0000 mg | ORAL_TABLET | Freq: Three times a day (TID) | ORAL | Status: DC | PRN

## 2016-04-07 NOTE — Patient Instructions (Signed)
Please take protein shakes 3 times a day.

## 2016-04-07 NOTE — Progress Notes (Signed)
Patient ID: Katherine Wood, female   DOB: [birth year], 75 y.o.   MRN: [id number]       Patient: Katherine Wood, Female    DOB: [birth year], 75 y.o.   MRN: [id number] Visit Date: 04/07/2016  Today's Provider: Lorie PhenixNancy Cierrah Dace, MD   Chief Complaint  Patient presents with  . Medicare Wellness   Subjective:    Annual wellness visit Katherine BashRosemarie Z Dezarn is a 75 y.o. female. She feels well. She reports exercising daily. She reports she is sleeping well. 07/10/07 Colonoscopy-diverticulosis, Dr. Servando SnareWohl -----------------------------------------------------------  Follow up for anxiety  The patient was last seen for this 2 weeks ago. Changes made at last visit include starting Buspar 15 mg.  She reports poor compliance with treatment. She feels that condition is Unchanged. She is having side effects.  Patient's reports that patient became very anxious after 3 doses. Patient has been taking escitalopram 30 mg daily.  ------------------------------------------------------------------------------------     Review of Systems  Constitutional: Positive for activity change, fatigue and unexpected weight change.  Eyes: Negative.   Respiratory: Negative.   Cardiovascular: Negative.   Gastrointestinal: Positive for diarrhea.  Endocrine: Positive for cold intolerance.  Genitourinary: Negative.   Musculoskeletal: Positive for back pain and arthralgias.  Skin: Negative.   Allergic/Immunologic: Positive for environmental allergies.  Neurological: Positive for headaches.  Hematological: Negative.   Psychiatric/Behavioral: Positive for confusion and decreased concentration.    Social History   Social History  . Marital Status: Married    Spouse Name: Renae Fickleaul  . Number of Children: 2  . Years of Education: N/A   Occupational History  . Not on file.   Social History Main Topics  . Smoking status: Never Smoker   . Smokeless tobacco: Never Used  . Alcohol Use: No  . Drug Use: No  .  Sexual Activity: Not on file   Other Topics Concern  . Not on file   Social History Narrative    Past Medical History  Diagnosis Date  . Allergy   . Depression   . Hyperlipidemia   . Anxiety   . Osteoporosis      Patient Active Problem List   Diagnosis Date Noted  . VRE (vancomycin-resistant Enterococci) infection 03/05/2016  . Abdominal abscess (HCC) 02/25/2016  . Diverticulitis large intestine 02/25/2016  . Enterococcus, vancomycin-resistant 02/25/2016  . Abscess of abdominal cavity (HCC)   . Protein-calorie malnutrition, severe 01/30/2016  . Diverticulitis of colon with perforation 01/19/2016  . Bowel perforation (HCC)   . Hernia of abdominal cavity   . Dementia in Alzheimer's disease with late onset 07/27/2015  . Memory loss 05/16/2015  . Arthritis 02/13/2015  . Narrowing of intervertebral disc space 02/13/2015  . Clinical depression 02/13/2015  . Hypercholesteremia 02/13/2015  . Amnesia 02/13/2015  . Symptomatic menopausal or female climacteric states 02/13/2015  . Arthritis, degenerative 02/13/2015  . Avitaminosis D 02/13/2015  . Connective tissue and disc stenosis of intervertebral foramina of abdomen and other regions 02/13/2015  . Bone/cartilage disorder 08/15/2009  . Anxiety state 10/31/2008  . Arthropathia 10/30/2008  . Leg varices 10/30/2008  . Acquired cyst of kidney 10/30/2008  . Cannot sleep 10/30/2008  . OP (osteoporosis) 10/30/2008  . Cystocele, midline 10/30/2008  . Involutional osteoporosis 10/30/2008    Past Surgical History  Procedure Laterality Date  . Cataract extraction Bilateral 08/2012    [facility]- Dr. Jettie PaganEpps  . Abdominal hysterectomy  1989  . Laparotomy N/A 01/18/2016    Procedure: EXPLORATORY LAPAROTOMY, LYSIS OF ADHESIONS, DRAINAGE  OF INTRABDOMINAL ABSCESS, HARTMAN'S PROCEDURE, UMBILICAL HERNIA REPAIR ;  Surgeon: Leafy Ro, MD;  Location: [facility];  Service: General;  Laterality: N/A;  . Laparoscopy N/A 02/01/2016     Procedure: Attempted laparoscopic,;  Surgeon: Leafy Ro, MD;  Location: [facility];  Service: General;  Laterality: N/A;  . Incision and drainage abscess N/A 02/01/2016    Procedure: INCISION AND DRAINAGE ABSCESS-OPEN;  Surgeon: Leafy Ro, MD;  Location: [facility];  Service: General;  Laterality: N/A;  . Laparotomy N/A 02/01/2016    Procedure: EXPLORATORY LAPAROTOMY , drainage of abscess, lysis of ahesions;  Surgeon: Leafy Ro, MD;  Location: [facility];  Service: General;  Laterality: N/A;    Her family history includes Coronary artery disease in her brother; Heart attack in her brother; Heart disease in her father; Hypertension in her mother; Parkinsonism in her mother.    Current Meds  Medication Sig  . ALPRAZolam (XANAX) 0.5 MG tablet Take 0.5 tablets (0.25 mg total) by mouth 3 (three) times daily as needed for anxiety or sleep.  Marland Kitchen donepezil (ARICEPT) 10 MG tablet Take 10 mg by mouth at bedtime.   Marland Kitchen escitalopram (LEXAPRO) 10 MG tablet Take 1 tablet by mouth daily.  . meloxicam (MOBIC) 15 MG tablet Take 1 tablet (15 mg total) by mouth daily.  Marland Kitchen zolpidem (AMBIEN) 5 MG tablet Take 1 tablet (5 mg total) by mouth at bedtime as needed for sleep.    Patient Care Team: Lorie Phenix, MD as PCP - General (Family Medicine)    Objective:   Vitals: BP 100/66 mmHg  Pulse 80  Temp(Src) 98 F (36.7 C) (Oral)  Resp 16  Ht 5\' 4"  (1.626 m)  Wt 112 lb (50.803 kg)  BMI 19.22 kg/m2  Physical Exam  Constitutional: She is oriented to person, place, and time. She appears well-developed and well-nourished.  HENT:  Head: Normocephalic and atraumatic.  Right Ear: Tympanic membrane, external ear and ear canal normal.  Left Ear: Tympanic membrane, external ear and ear canal normal.  Nose: Nose normal.  Mouth/Throat: Uvula is midline, oropharynx is clear and moist and mucous membranes are normal.  Eyes: Conjunctivae, EOM and lids are normal. Pupils are equal, round, and reactive to light.  Neck:  Trachea normal and normal range of motion. Neck supple. Carotid bruit is not present. No thyroid mass and no thyromegaly present.  Cardiovascular: Normal rate, regular rhythm and normal heart sounds.   Pulmonary/Chest: Effort normal and breath sounds normal.  Abdominal: Soft. Normal appearance and bowel sounds are normal. There is no hepatosplenomegaly. There is no tenderness.  Musculoskeletal: Normal range of motion.  Lymphadenopathy:    She has no cervical adenopathy.    She has no axillary adenopathy.  Neurological: She is alert and oriented to person, place, and time. She has normal strength. No cranial nerve deficit.  Skin: Skin is warm, dry and intact.  Psychiatric: She has a normal mood and affect. Her speech is normal and behavior is normal. Judgment and thought content normal. Cognition and memory are normal.    Activities of Daily Living In your present state of health, do you have any difficulty performing the following activities: 04/07/2016 01/21/2016  Hearing? N N  Vision? N N  Difficulty concentrating or making decisions? Malvin Johns  Walking or climbing stairs? N N  Dressing or bathing? N N  Doing errands, shopping? Y N    Fall Risk Assessment Fall Risk  04/07/2016 04/02/2015  Falls in the past year?  No No     Depression Screen PHQ 2/9 Scores 04/07/2016 04/07/2016 04/07/2016 04/02/2015  PHQ - 2 Score 3 3 0 0  PHQ- 9 Score 6 6 - -    Cognitive Testing - 6-CIT  Correct? Score   What year is it? no 4 0 or 4  What month is it? yes 0 0 or 3  Memorize:    Floyde Parkins,  [birth year],  [address],  [place],      What time is it? (within 1 hour) yes 0 0 or 3  Count backwards from 20 yes 0 0, 2, or 4  Name the months of the year no 4 0, 2, or 4  Repeat name & address above no 10 0, 2, 4, 6, 8, or 10       TOTAL SCORE  18/28   Interpretation:  Abnormal 18/28  Normal (0-7) Abnormal (8-28)       Assessment & Plan:     Annual Wellness Visit  Reviewed patient's Family Medical  History Reviewed and updated list of patient's medical providers Assessment of cognitive impairment was done Assessed patient's functional ability Established a written schedule for health screening services Health Risk Assessent Completed and Reviewed  Exercise Activities and Dietary recommendations Goals    . Exercise 150 minutes per week (moderate activity)    . Have 3 meals a day       Immunization History  Administered Date(s) Administered  . Pneumococcal Polysaccharide-23 07/27/2012  . Td 10/31/2008  . Tdap 09/06/2012  . Zoster 09/06/2012   ------------------------------------------------------------------------------------------------------------  1. Medicare annual wellness visit, subsequent Stable. Patient advised to continue eating healthy and exercise daily.  2. Anxiety state Stable. Patient advised to continue escitalopram at 20 mg and start lorazepam 1 mg as below. Patient will follow-up with Gillis Santa. - LORazepam (ATIVAN) 1 MG tablet; Take 1 tablet (1 mg total) by mouth every 8 (eight) hours as needed for anxiety.  Dispense: 90 tablet; Refill: 3 - escitalopram (LEXAPRO) 20 MG tablet; Take 1 tablet (20 mg total) by mouth daily.  Dispense: 90 tablet; Refill: 3  3. Other iron deficiency anemias - CBC with Differential/Platelet - Comprehensive metabolic panel   Patient seen and examined by Dr. Leo Grosser, and note scribed by Liz Beach. Dimas, CMA.  I have reviewed the document for accuracy and completeness and I agree with above. - Leo Grosser, MD   Lorie Phenix, MD  [facility]

## 2016-04-08 ENCOUNTER — Telehealth: Payer: Self-pay

## 2016-04-08 LAB — COMPREHENSIVE METABOLIC PANEL
ALBUMIN: 3.9 g/dL (ref 3.5–4.8)
ALT: 10 IU/L (ref 0–32)
AST: 14 IU/L (ref 0–40)
Albumin/Globulin Ratio: 1.3 (ref 1.2–2.2)
Alkaline Phosphatase: 68 IU/L (ref 39–117)
BILIRUBIN TOTAL: 0.3 mg/dL (ref 0.0–1.2)
BUN / CREAT RATIO: 23 (ref 12–28)
BUN: 21 mg/dL (ref 8–27)
CHLORIDE: 102 mmol/L (ref 96–106)
CO2: 21 mmol/L (ref 18–29)
Calcium: 9.9 mg/dL (ref 8.7–10.3)
Creatinine, Ser: 0.93 mg/dL (ref 0.57–1.00)
GFR calc non Af Amer: 61 mL/min/{1.73_m2} (ref >59)
GFR, EST AFRICAN AMERICAN: 71 mL/min/{1.73_m2} (ref >59)
GLOBULIN, TOTAL: 2.9 g/dL (ref 1.5–4.5)
GLUCOSE: 92 mg/dL (ref 65–99)
POTASSIUM: 4.2 mmol/L (ref 3.5–5.2)
SODIUM: 140 mmol/L (ref 134–144)
TOTAL PROTEIN: 6.8 g/dL (ref 6.0–8.5)

## 2016-04-08 LAB — CBC WITH DIFFERENTIAL/PLATELET
BASOS ABS: 0 10*3/uL (ref 0.0–0.2)
Basos: 0 %
EOS (ABSOLUTE): 0 10*3/uL (ref 0.0–0.4)
Eos: 1 %
Hematocrit: 30.2 % — ABNORMAL LOW (ref 34.0–46.6)
Hemoglobin: 9.9 g/dL — ABNORMAL LOW (ref 11.1–15.9)
IMMATURE GRANS (ABS): 0.1 10*3/uL (ref 0.0–0.1)
Immature Granulocytes: 1 %
LYMPHS: 19 %
Lymphocytes Absolute: 1.3 10*3/uL (ref 0.7–3.1)
MCH: 31.2 pg (ref 26.6–33.0)
MCHC: 32.8 g/dL (ref 31.5–35.7)
MCV: 95 fL (ref 79–97)
MONOS ABS: 0.6 10*3/uL (ref 0.1–0.9)
Monocytes: 9 %
NEUTROS ABS: 4.9 10*3/uL (ref 1.4–7.0)
NEUTROS PCT: 70 %
PLATELETS: 399 10*3/uL — AB (ref 150–379)
RBC: 3.17 x10E6/uL — ABNORMAL LOW (ref 3.77–5.28)
RDW: 16.2 % — AB (ref 12.3–15.4)
WBC: 6.9 10*3/uL (ref 3.4–10.8)

## 2016-04-08 NOTE — Telephone Encounter (Signed)
-----   Message from Lorie PhenixNancy Maloney, MD sent at 04/08/2016  7:24 AM EDT ----- Labs about the same. Protein is slightly improved. Recheck in 2 months with new provider or sooner if any blood in colostomy stool. Thanks.

## 2016-04-08 NOTE — Telephone Encounter (Signed)
Mr. Gloris HamGavasto advised.   Thanks,   -Vernona RiegerLaura

## 2016-04-14 ENCOUNTER — Telehealth: Payer: Self-pay | Admitting: Family Medicine

## 2016-04-14 NOTE — Telephone Encounter (Signed)
Stool is still runny. Tried OTC medication, did not help. Recommend call ID and check. Thanks.

## 2016-04-14 NOTE — Telephone Encounter (Signed)
Pt's husband called stating his wife would like to have something called into [facility] on [address] for diarrhea.  This has been going for to about two weeks and OTC meds are not working.

## 2016-05-18 ENCOUNTER — Ambulatory Visit: Payer: Medicare Other | Admitting: Physician Assistant

## 2016-05-21 ENCOUNTER — Encounter: Payer: Self-pay | Admitting: Physician Assistant

## 2016-05-21 ENCOUNTER — Ambulatory Visit (INDEPENDENT_AMBULATORY_CARE_PROVIDER_SITE_OTHER): Payer: Medicare Other | Admitting: Physician Assistant

## 2016-05-21 VITALS — BP 90/58 | HR 82 | Temp 98.1°F | Resp 16 | Wt 109.4 lb

## 2016-05-21 DIAGNOSIS — G301 Alzheimer's disease with late onset: Secondary | ICD-10-CM | POA: Diagnosis not present

## 2016-05-21 DIAGNOSIS — F411 Generalized anxiety disorder: Secondary | ICD-10-CM

## 2016-05-21 DIAGNOSIS — F0280 Dementia in other diseases classified elsewhere without behavioral disturbance: Secondary | ICD-10-CM

## 2016-05-21 MED ORDER — ALPRAZOLAM 0.5 MG PO TABS: 0.2500 mg | ORAL_TABLET | Freq: Three times a day (TID) | ORAL | 5 refills | Status: DC | PRN

## 2016-05-21 NOTE — Progress Notes (Signed)
Patient: Katherine Wood Female    DOB: 1941-05-11   75 y.o.   MRN: [id number] Visit Date: 05/21/2016  Today's Provider: Margaretann Loveless, PA-C   Chief Complaint  Patient presents with  . Follow-up    Anxiety   Subjective:    HPI Patient is here to follow-up on Anxiety.  Anxiety: Patient following up on Anxiety. She has the following symptoms: difficulty concentrating, dizziness, irritable, palpitations. She denies current suicidal and homicidal ideation. Previous treatment includes Lexapro  and Lorazepam  TID. She was advised last office visit to continue Escitalopram at  and to start Lorazepam . She complains of the following side effects from the treatment: none  Patient's husband reports that she is only taking the Lorazepam 3 times a day and Depression medication. Per husband thinks patient is better off with the Xanax which he reports patient is not taking. He states that her symptoms were better controlled and she did not have to take as much. She does have progressive dementia and is being followed by Dr. Sherryll Burger. They return to see him in Sept. 2017.     Allergies  Allergen Reactions  . Galantamine Other (See Comments)   Current Meds  Medication Sig  . donepezil (ARICEPT) 10 MG tablet Take 10 mg by mouth at bedtime.   Marland Kitchen escitalopram (LEXAPRO) 20 MG tablet Take 1 tablet (20 mg total) by mouth daily.  Marland Kitchen HYDROcodone-acetaminophen (NORCO/VICODIN) 5-325 MG tablet Take 2 tablets by mouth every 4 (four) hours as needed for moderate pain.  Marland Kitchen LORazepam (ATIVAN) 1 MG tablet Take 1 tablet (1 mg total) by mouth every 8 (eight) hours as needed for anxiety.  . meloxicam (MOBIC) 15 MG tablet Take 1 tablet (15 mg total) by mouth daily.  Marland Kitchen zolpidem (AMBIEN) 5 MG tablet Take 1 tablet (5 mg total) by mouth at bedtime as needed for sleep.    Review of Systems  Constitutional: Negative.   Respiratory: Negative.   Cardiovascular: Positive for palpitations. Negative  for chest pain and leg swelling.  Gastrointestinal: Negative.   Neurological: Positive for dizziness and headaches. Negative for light-headedness.  Psychiatric/Behavioral: Positive for agitation (sometimes) and decreased concentration. Negative for self-injury, sleep disturbance and suicidal ideas. The patient is nervous/anxious.     Social History  Substance Use Topics  . Smoking status: Never Smoker  . Smokeless tobacco: Never Used  . Alcohol use No   Objective:   BP (!) 90/58 (BP Location: Left Arm, Patient Position: Sitting, Cuff Size: Normal)   Pulse 82   Temp 98.1 F (36.7 C) (Oral)   Resp 16   Wt 109 lb 6.4 oz (49.6 kg)   BMI 18.78 kg/m   Physical Exam  Constitutional: She appears well-developed and well-nourished. No distress.  Neck: Normal range of motion. Neck supple. No tracheal deviation present. No thyromegaly present.  Cardiovascular: Normal rate, regular rhythm and normal heart sounds.  Exam reveals no gallop and no friction rub.   No murmur heard. Pulmonary/Chest: Effort normal and breath sounds normal. No respiratory distress. She has no wheezes. She has no rales.  Lymphadenopathy:    She has no cervical adenopathy.  Skin: She is not diaphoretic.  Psychiatric: She has a normal mood and affect. Her behavior is normal. Judgment and thought content normal.  Vitals reviewed.     Assessment & Plan:     1. Anxiety state Will switch back to xanax and discontinue lorazepam. She is to continue escitalopram  as  well. I will see her back in 3 months, unless needed sooner, to re-evaluate.  - ALPRAZolam (XANAX) 0.5 MG tablet; Take 0.5 tablets (0.25 mg total) by mouth 3 (three) times daily as needed for anxiety or sleep.  Dispense: 90 tablet; Refill: 5  2. Dementia in Alzheimer's disease with late onset Worsening per husband. Followed by Dr. Sherryll Burger and they return to see him 07/05/16. Currently taking aricept 10mg  nightly.       Margaretann Loveless, PA-C    [facility]

## 2016-05-21 NOTE — Patient Instructions (Signed)
Alzheimer Disease Caregiver Guide Alzheimer disease is an illness that affects a person's brain. It causes a person to lose the ability to remember things and make good decisions. As the disease progresses, the person is unable to take care of himself or herself and needs more and more help to do simple tasks. Taking care of someone with Alzheimer disease can be very challenging and overwhelming.  MEMORY LOSS AND CONFUSION Memory loss and confusion is mild in the beginning stages of the disease. Both of these problems become more severe as the disease progresses. Eventually, the person will not recognize places or even close family members and friends.   Stay calm.  Respond with a short explanation. Long explanations can be overwhelming and confusing.  Avoid corrections that sound like scolding.  Try not to take it personally, even if the person forgets your name. BEHAVIOR CHANGES Behavior changes are part of the disease. The person may develop depression, anxiety, anger, hallucinations, or other behavior changes. These changes can come on suddenly and may be in response to pain, infection, changes in the environment (temperature, noise), overstimulation, or feeling lost or scared.   Try not to take behavior changes personally.  Remain calm and patient.  Do not argue or try to convince the person about a specific point. This will only make him or her more agitated.  Know that the behavior changes are part of the disease process and try to work through it. TIPS TO REDUCE FRUSTRATION  Schedule wisely by making appointments and doing daily tasks, like bathing and dressing, when the person is at his or her best.  Take your time. Simple tasks may take a lot longer, so be sure to allow for plenty of time.  Limit choices. Too many choices can be overwhelming and stressful for the person.  Involve the person in what you are doing.  Stick to a routine.  Avoid new or crowded situations, if  possible.  Use simple words, short sentences, and a calm voice. Only give one direction at a time.  Buy clothes and shoes that are easy to put on and take off.  Let people help if they offer. HOME SAFETY Keeping the home safe is very important to reduce the risk of falls and injuries.   Keep floors clear of clutter. Remove rugs, magazine racks, and floor lamps.  Keep hallways well lit.  Put a handrail and nonslip mat in the bathtub or shower.  Put childproof locks on cabinets with dangerous items, such as medicine, alcohol, guns, toxic cleaning items, sharp tools or utensils, matches, or lighters.  Place locks on doors where the person cannot easily see or reach them. This helps ensure that the person cannot wander out of the house and get lost.  Be prepared for emergencies. Keep a list of emergency phone numbers and addresses in a convenient area. PLANS FOR THE FUTURE  Do not put off talking about finances.  Talk about money management. People with Alzheimer disease have trouble managing their money as the disease gets worse.  Get help from professional advisors regarding financial and legal matters.  Do not put off talking about future care.  Choose a power of attorney. This is someone who can make decisions for the person with Alzheimer disease when he or she is no longer able to do so.  Talk about driving and when it is the right time to stop. The person's health care provider can help give advice on this matter.  Talk about   the person's living situation. If he or she lives alone, you need to make sure he or she is safe. Some people need extra help at home, and others need more care at a nursing home or care center. SUPPORT GROUPS Joining a support group can be very helpful for caregivers of people with Alzheimer disease. Some advantages to being part of a support group include:   Getting strategies to manage stress.  Sharing experiences with others.  Receiving  emotional comfort and support.  Learning new caregiving skills as the disease progresses.  Knowing what community resources are available and taking advantage of them. SEEK MEDICAL CARE IF:  The person has a fever.  The person has a sudden change in behavior that does not improve with calming strategies.  The person is unable to manage in his or her current living situation.  The person threatens you or anyone else, including himself or herself.  You are no longer able to care for the person.   This information is not intended to replace advice given to you by your health care provider. Make sure you discuss any questions you have with your health care provider.   Document Released: 06/22/2004 Document Revised: 11/01/2014 Document Reviewed: 11/17/2011 Elsevier Interactive Patient Education 2016 Elsevier Inc.  

## 2016-08-17 ENCOUNTER — Encounter: Payer: Self-pay | Admitting: Family Medicine

## 2016-08-17 ENCOUNTER — Ambulatory Visit (INDEPENDENT_AMBULATORY_CARE_PROVIDER_SITE_OTHER): Payer: Medicare Other | Admitting: Family Medicine

## 2016-08-17 VITALS — BP 90/60 | HR 78 | Temp 98.3°F | Resp 16 | Wt 117.0 lb

## 2016-08-17 DIAGNOSIS — E559 Vitamin D deficiency, unspecified: Secondary | ICD-10-CM

## 2016-08-17 DIAGNOSIS — Z939 Artificial opening status, unspecified: Secondary | ICD-10-CM | POA: Insufficient documentation

## 2016-08-17 DIAGNOSIS — Z87898 Personal history of other specified conditions: Secondary | ICD-10-CM | POA: Diagnosis not present

## 2016-08-17 DIAGNOSIS — Z23 Encounter for immunization: Secondary | ICD-10-CM

## 2016-08-17 DIAGNOSIS — R413 Other amnesia: Secondary | ICD-10-CM

## 2016-08-17 DIAGNOSIS — M810 Age-related osteoporosis without current pathological fracture: Secondary | ICD-10-CM

## 2016-08-17 DIAGNOSIS — F329 Major depressive disorder, single episode, unspecified: Secondary | ICD-10-CM | POA: Diagnosis not present

## 2016-08-17 DIAGNOSIS — F32A Depression, unspecified: Secondary | ICD-10-CM

## 2016-08-17 NOTE — Progress Notes (Signed)
Patient: Rueben BashRosemarie Z Beckley Female    DOB: Mar 30, 1941   75 y.o.   MRN: [id number] Visit Date: 08/17/2016  Today's Provider: Mila Merryonald Robie Oats, MD   Chief Complaint  Patient presents with  . Anxiety    follow up   Subjective:    HPI  This is a previous patient of Dr. Elease HashimotoMaloney present today as new patient to me to establish care and follow up on chronic medical problems.   Follow up Anxiety/ Depression:   Patient was last seen for this problem 3 months ago. Changes made during that visit includes switching back to Xanax and discontinuing Lorazepam. Patient was to continue Escitalopram 20mg . Today patient come in reporting poor compliance with treatment. Patient stopped taking Escitalopram  Due to it causing severe diarrhea which started after Dr. Sherryll BurgerShah started her on Aricept. Patient has been taking Xanax as needed which she feels has helped to control Anxiety.  good tolerance and symptom control. Patient is accompanied by her spouse who reports that patients mood has been fine since stopping escitalopram. She has fairly long history of depression, previously taken sertraline.   She has had colostomy since hospitalization in March for diverticulitis and pneumoperitoneum. She was last seen by Dr. Everlene FarrierPabon in May.   She has long history of osteoporosis previously tried Fosamax and Boniva, but apparently stopped a few years ago due to it causing joint aches.     Allergies  Allergen Reactions  . Galantamine Other (See Comments)     Current Outpatient Prescriptions:  .  ALPRAZolam (XANAX) 0.5 MG tablet, Take 0.5 tablets (0.25 mg total) by mouth 3 (three) times daily as needed for anxiety or sleep., Disp: 90 tablet, Rfl: 5 .  meloxicam (MOBIC) 15 MG tablet, Take 1 tablet (15 mg total) by mouth daily., Disp: 30 tablet, Rfl: 5 .  memantine (NAMENDA) 5 MG tablet, Take 2 tablets by mouth daily., Disp: , Rfl:  .  zolpidem (AMBIEN) 5 MG tablet, Take 1 tablet (5 mg total) by mouth at bedtime as  needed for sleep., Disp: 30 tablet, Rfl: 5 .  escitalopram (LEXAPRO) 20 MG tablet, Take 1 tablet (20 mg total) by mouth daily. (Patient not taking: Reported on 08/17/2016), Disp: 90 tablet, Rfl: 3  Review of Systems  Constitutional: Negative for appetite change, chills, fatigue and fever.  Respiratory: Negative for chest tightness and shortness of breath.   Cardiovascular: Negative for chest pain and palpitations.  Gastrointestinal: Negative for abdominal pain, nausea and vomiting.  Neurological: Negative for dizziness and weakness.  Psychiatric/Behavioral: Positive for confusion. Negative for agitation and dysphoric mood. The patient is nervous/anxious (improved).     Social History  Substance Use Topics  . Smoking status: Never Smoker  . Smokeless tobacco: Never Used  . Alcohol use No   Objective:   BP 90/60 (BP Location: Left Arm, Patient Position: Sitting, Cuff Size: Normal)   Pulse 78   Temp 98.3 F (36.8 C) (Oral)   Resp 16   Wt 117 lb (53.1 kg)   SpO2 98% Comment: room air  BMI 20.08 kg/m   Physical Exam   General Appearance:    Alert, cooperative, no distress  Eyes:    PERRL, conjunctiva/corneas clear, EOM's intact       Lungs:     Clear to auscultation bilaterally, respirations unlabored  Heart:    Regular rate and rhythm  Neurologic:   Awake, alert, oriented x 3. No apparent focal neurological  defect.           Assessment & Plan:     1. Memory loss Worsened since surgery for diverticulitis in march, but husband feels addition of namenda doctor shah has been helpful  2. Depression, unspecified depression type In remission, off of escitalopram due to diarrhea which has now resolved. Will reassess in about 3 months.   3. Osteoporosis, unspecified osteoporosis type, unspecified pathological fracture presence Off bisphosphonate due to arthralgias. Advised she is due for follow up BMD. They would like to hold of for now. Will discuss further at follow  up in 3 months.   4. History of creation of ostomy Prescriptions written today for ostomy supplies.   5. Need for influenza vaccination  - Flu vaccine HIGH DOSE PF (Fluzone High dose)  6. Need for pneumococcal vaccination - Prevnar 13   7. Vitamin D deficiency Currently off of supplements. Will check labs at follow up.        Mila Merry, MD  [facility]

## 2016-08-18 ENCOUNTER — Ambulatory Visit: Payer: Medicare Other | Admitting: Family Medicine

## 2016-08-23 ENCOUNTER — Telehealth: Payer: Self-pay | Admitting: Family Medicine

## 2016-08-23 NOTE — Telephone Encounter (Signed)
[facility] needing a note to go along with the order for supplies saying that Mrs. Katherine Wood has had an ostomy/  Please fax to [phone]  Thanks teri

## 2016-08-23 NOTE — Telephone Encounter (Signed)
Please review. Thanks!  

## 2016-08-23 NOTE — Telephone Encounter (Signed)
Can you please fax over a note stating patient has a colostomy. Thanks.

## 2016-08-24 NOTE — Telephone Encounter (Signed)
Letter printed.

## 2016-11-17 ENCOUNTER — Ambulatory Visit (INDEPENDENT_AMBULATORY_CARE_PROVIDER_SITE_OTHER): Payer: Medicare Other | Admitting: Family Medicine

## 2016-11-17 ENCOUNTER — Encounter: Payer: Self-pay | Admitting: Family Medicine

## 2016-11-17 VITALS — BP 110/64 | HR 78 | Temp 98.2°F | Resp 16 | Ht 64.0 in | Wt 123.0 lb

## 2016-11-17 DIAGNOSIS — M810 Age-related osteoporosis without current pathological fracture: Secondary | ICD-10-CM | POA: Diagnosis not present

## 2016-11-17 DIAGNOSIS — E7800 Pure hypercholesterolemia, unspecified: Secondary | ICD-10-CM

## 2016-11-17 DIAGNOSIS — E559 Vitamin D deficiency, unspecified: Secondary | ICD-10-CM

## 2016-11-17 DIAGNOSIS — F411 Generalized anxiety disorder: Secondary | ICD-10-CM

## 2016-11-17 DIAGNOSIS — D508 Other iron deficiency anemias: Secondary | ICD-10-CM | POA: Diagnosis not present

## 2016-11-17 NOTE — Progress Notes (Signed)
Patient: Katherine Wood Female    DOB: 1941/05/29   76 y.o.   MRN: [id number] Visit Date: 11/17/2016  Today's Provider: Mila Merryonald Kashmere Staffa, MD   Chief Complaint  Patient presents with  . Follow-up  . Depression  . Osteoporosis  . Memory Loss   Subjective:    HPI  Memory loss From 08/17/2016, at that time was tolerating 4869m BID Namenda. Has since had follow up with neurology and medications was increased to 10mg  BID. Her husband reports no improvement in memory and has had some trouble with dizziness and constipation since changing dose. She does have follow up with neurology soon.  Depression, unspecified depression type From 08/17/2016-off of escitalopram due to diarrhea which has since resolved. Patient states she occasionally feels a little depressed, but usually in good spiriits. He occasionally takes alprazolam when she gets stressed, but this is usually just a few times per month.   Osteoporosis, unspecified osteoporosis type, unspecified pathological fracture presence From 08/17/2016- Declined BMD at that time, but is ok to go ahead and schedule now. Did not tolerate Fosamax in the past.     Vitamin D deficiency From 08/17/2016-Currently off of supplements.      Allergies  Allergen Reactions  . Fosamax [Alendronate Sodium]     Joint pains  . Galantamine Other (See Comments)     Current Outpatient Prescriptions:  .  ALPRAZolam (XANAX) 0.5 MG tablet, Take 0.5 tablets (0.25 mg total) by mouth 3 (three) times daily as needed for anxiety or sleep., Disp: 90 tablet, Rfl: 5 .  meloxicam (MOBIC) 15 MG tablet, Take 1 tablet (15 mg total) by mouth daily., Disp: 30 tablet, Rfl: 5 .  memantine (NAMENDA) 5 MG tablet, Take 2 tablets by mouth daily., Disp: , Rfl:   Review of Systems  Constitutional: Negative for appetite change, chills, fatigue and fever.  Respiratory: Negative for chest tightness and shortness of breath.   Cardiovascular: Negative for chest pain  and palpitations.  Gastrointestinal: Negative for abdominal pain, nausea and vomiting.  Neurological: Negative for dizziness and weakness.    Social History  Substance Use Topics  . Smoking status: Never Smoker  . Smokeless tobacco: Never Used  . Alcohol use No   Objective:   BP 110/64 (BP Location: Right Arm, Patient Position: Sitting, Cuff Size: Normal)   Pulse 78   Temp 98.2 F (36.8 C) (Oral)   Resp 16   Ht 5\' 4"  (1.626 m)   Wt 123 lb (55.8 kg)   SpO2 99%   BMI 21.11 kg/m   Physical Exam   General Appearance:    Alert, cooperative, no distress  Eyes:    PERRL, conjunctiva/corneas clear, EOM's intact       Lungs:     Clear to auscultation bilaterally, respirations unlabored  Heart:    Regular rate and rhythm  Neurologic:   Awake, alert, oriented x 3. No apparent focal neurological           defect.           Assessment & Plan:     1. Other iron deficiency anemia Is due to recheck labs.  - CBC - Comprehensive metabolic panel  2. Vitamin D deficiency Currently off of supplements, but has OTC calcium D supplements at time which she is going to restart.  - VITAMIN D 25 Hydroxy (Vit-D Deficiency, Fractures)  3. Osteoporosis, unspecified osteoporosis type, unspecified pathological fracture presence Due for follow up BMD - VITAMIN D 25  Hydroxy (Vit-D Deficiency, Fractures) - TSH - DG Bone Density; Future  4. Anxiety state Doing well and rarely requiring alprazolam. Is currently off of SSRI.        Mila Merry, MD  [facility]

## 2016-11-18 ENCOUNTER — Telehealth: Payer: Self-pay

## 2016-11-18 LAB — COMPREHENSIVE METABOLIC PANEL
A/G RATIO: 1.6 (ref 1.2–2.2)
ALT: 10 IU/L (ref 0–32)
AST: 17 IU/L (ref 0–40)
Albumin: 4.1 g/dL (ref 3.5–4.8)
Alkaline Phosphatase: 75 IU/L (ref 39–117)
BUN/Creatinine Ratio: 23 (ref 12–28)
BUN: 19 mg/dL (ref 8–27)
Bilirubin Total: 0.3 mg/dL (ref 0.0–1.2)
CALCIUM: 9.1 mg/dL (ref 8.7–10.3)
CO2: 25 mmol/L (ref 18–29)
Chloride: 104 mmol/L (ref 96–106)
Creatinine, Ser: 0.82 mg/dL (ref 0.57–1.00)
GFR, EST AFRICAN AMERICAN: 82 mL/min/{1.73_m2} (ref >59)
GFR, EST NON AFRICAN AMERICAN: 71 mL/min/{1.73_m2} (ref >59)
GLOBULIN, TOTAL: 2.5 g/dL (ref 1.5–4.5)
Glucose: 103 mg/dL — ABNORMAL HIGH (ref 65–99)
Potassium: 4 mmol/L (ref 3.5–5.2)
SODIUM: 146 mmol/L — AB (ref 134–144)
TOTAL PROTEIN: 6.6 g/dL (ref 6.0–8.5)

## 2016-11-18 LAB — VITAMIN D 25 HYDROXY (VIT D DEFICIENCY, FRACTURES): Vit D, 25-Hydroxy: 21.4 ng/mL — ABNORMAL LOW (ref 30.0–100.0)

## 2016-11-18 LAB — CBC
HEMATOCRIT: 34.7 % (ref 34.0–46.6)
Hemoglobin: 11.6 g/dL (ref 11.1–15.9)
MCH: 33.4 pg — ABNORMAL HIGH (ref 26.6–33.0)
MCHC: 33.4 g/dL (ref 31.5–35.7)
MCV: 100 fL — ABNORMAL HIGH (ref 79–97)
Platelets: 292 10*3/uL (ref 150–379)
RBC: 3.47 x10E6/uL — AB (ref 3.77–5.28)
RDW: 13.7 % (ref 12.3–15.4)
WBC: 4.5 10*3/uL (ref 3.4–10.8)

## 2016-11-18 LAB — TSH: TSH: 1.75 u[IU]/mL (ref 0.450–4.500)

## 2016-11-18 NOTE — Telephone Encounter (Signed)
Patient's husband Katherine Wood was advised as directed below. Verbalized understanding.  Thanks,  -Humaira Sculley

## 2016-11-18 NOTE — Telephone Encounter (Signed)
-----   Message from Malva Limesonald E Fisher, MD sent at 11/18/2016  8:08 AM EST ----- Vitamin d levels are low. Sometimes this can affect nervous system including memory. Should start taking OTC vitamin D3 2,000 units every day. All other labs are good. Recheck vitamin d level in 3 months. Will contact patient when it's time to get rechecked.

## 2016-12-07 DIAGNOSIS — F320 Major depressive disorder, single episode, mild: Secondary | ICD-10-CM | POA: Insufficient documentation

## 2017-01-06 ENCOUNTER — Ambulatory Visit
Admission: RE | Admit: 2017-01-06 | Discharge: 2017-01-06 | Disposition: A | Discharge status: Home / Self Care | Payer: Medicare Other | Source: Ambulatory Visit | Attending: Family Medicine | Admitting: Family Medicine

## 2017-01-06 ENCOUNTER — Telehealth: Payer: Self-pay

## 2017-01-06 ENCOUNTER — Encounter: Payer: Self-pay | Admitting: Family Medicine

## 2017-01-06 DIAGNOSIS — M810 Age-related osteoporosis without current pathological fracture: Secondary | ICD-10-CM

## 2017-01-06 NOTE — Telephone Encounter (Signed)
-----   Message from Malva Limesonald E Fisher, MD sent at 01/06/2017  2:08 PM EDT ----- Osteoporosis has gotten much worse and she is very high risk of hip and vertebral fractures. Since she did not tolerate Fosamax in the past I would recommend referral to endocrinologist. They can administer a yearly or semi-yearly  injectable medication that doesn't have side effects.

## 2017-01-06 NOTE — Telephone Encounter (Signed)
Patient advised. She agreed to proceed with Endo referral. Ordered referral. Patient advised she will receive a call back with appointment date/time.

## 2017-02-07 ENCOUNTER — Telehealth: Payer: Self-pay | Admitting: Family Medicine

## 2017-02-07 ENCOUNTER — Other Ambulatory Visit: Payer: Self-pay

## 2017-02-07 DIAGNOSIS — E559 Vitamin D deficiency, unspecified: Secondary | ICD-10-CM

## 2017-02-07 NOTE — Telephone Encounter (Signed)
Is time to check vitamin D 25-oh level for vitamin d deficiency. Please advise patent and leave order at front desk to pick up. Thanks.

## 2017-02-07 NOTE — Telephone Encounter (Signed)
Patietn advised  ED 

## 2017-02-08 LAB — VITAMIN D 25 HYDROXY (VIT D DEFICIENCY, FRACTURES): Vit D, 25-Hydroxy: 25.3 ng/mL — ABNORMAL LOW (ref 30.0–100.0)

## 2017-02-08 NOTE — Progress Notes (Signed)
Advised  ED 

## 2017-02-23 ENCOUNTER — Other Ambulatory Visit: Payer: Self-pay | Admitting: Family Medicine

## 2017-02-23 DIAGNOSIS — M159 Polyosteoarthritis, unspecified: Secondary | ICD-10-CM

## 2017-02-23 DIAGNOSIS — M150 Primary generalized (osteo)arthritis: Principal | ICD-10-CM

## 2017-02-23 MED ORDER — MELOXICAM 15 MG PO TABS: 15.0000 mg | ORAL_TABLET | Freq: Every day | ORAL | 5 refills | Status: DC

## 2017-02-23 NOTE — Telephone Encounter (Signed)
[facility] faxed a request for the following medication. Thanks CC  meloxicam (MOBIC) 15 MG tablet  Take 1 tablet ( 15 MG ) by mouth daily.

## 2017-04-26 ENCOUNTER — Ambulatory Visit: Payer: Medicare Other

## 2017-04-26 ENCOUNTER — Encounter: Payer: Self-pay | Admitting: Family Medicine

## 2017-04-26 ENCOUNTER — Ambulatory Visit (INDEPENDENT_AMBULATORY_CARE_PROVIDER_SITE_OTHER): Payer: Medicare Other | Admitting: Family Medicine

## 2017-04-26 VITALS — BP 114/72 | HR 76 | Temp 97.7°F | Ht 64.0 in | Wt 120.4 lb

## 2017-04-26 DIAGNOSIS — Z1211 Encounter for screening for malignant neoplasm of colon: Secondary | ICD-10-CM

## 2017-04-26 DIAGNOSIS — Z0000 Encounter for general adult medical examination without abnormal findings: Secondary | ICD-10-CM

## 2017-04-26 DIAGNOSIS — E559 Vitamin D deficiency, unspecified: Secondary | ICD-10-CM

## 2017-04-26 DIAGNOSIS — G309 Alzheimer's disease, unspecified: Secondary | ICD-10-CM | POA: Diagnosis not present

## 2017-04-26 DIAGNOSIS — M810 Age-related osteoporosis without current pathological fracture: Secondary | ICD-10-CM | POA: Diagnosis not present

## 2017-04-26 DIAGNOSIS — E7800 Pure hypercholesterolemia, unspecified: Secondary | ICD-10-CM | POA: Diagnosis not present

## 2017-04-26 DIAGNOSIS — F0280 Dementia in other diseases classified elsewhere without behavioral disturbance: Secondary | ICD-10-CM

## 2017-04-26 DIAGNOSIS — T7840XD Allergy, unspecified, subsequent encounter: Secondary | ICD-10-CM

## 2017-04-26 DIAGNOSIS — T7840XA Allergy, unspecified, initial encounter: Secondary | ICD-10-CM | POA: Insufficient documentation

## 2017-04-26 NOTE — Progress Notes (Signed)
Subjective:   Katherine Wood is a 76 y.o. female who presents for Medicare Annual (Subsequent) preventive examination.  Review of Systems:  N/A  Cardiac Risk Factors include: advanced age (>49men, >72 women);dyslipidemia     Objective:     Vitals: BP 114/72 (BP Location: Left Arm)   Pulse 76   Temp 97.7 F (36.5 C) (Oral)   Ht 5\' 4"  (1.626 m)   Wt 120 lb 6.4 oz (54.6 kg)   BMI 20.67 kg/m   Body mass index is 20.67 kg/m.   Tobacco History  Smoking Status  . Never Smoker  Smokeless Tobacco  . Never Used     Counseling given: Not Answered   Past Medical History:  Diagnosis Date  . Allergy   . Anxiety   . Clinical depression 02/13/2015  . Depression   . Enterococcus, vancomycin-resistant 02/25/2016  . Hyperlipidemia   . Osteoporosis    Past Surgical History:  Procedure Laterality Date  . ABDOMINAL HYSTERECTOMY  1989  . CATARACT EXTRACTION Bilateral 08/2012   [facility]- Dr. Jettie Pagan  . INCISION AND DRAINAGE ABSCESS N/A 02/01/2016   Procedure: INCISION AND DRAINAGE ABSCESS-OPEN;  Surgeon: Leafy Ro, MD;  Location: [facility] ORS;  Service: General;  Laterality: N/A;  . LAPAROSCOPY N/A 02/01/2016   Procedure: Attempted laparoscopic,;  Surgeon: Leafy Ro, MD;  Location: [facility] ORS;  Service: General;  Laterality: N/A;  . LAPAROTOMY N/A 01/18/2016   Procedure: EXPLORATORY LAPAROTOMY, LYSIS OF ADHESIONS, DRAINAGE OF INTRABDOMINAL ABSCESS, HARTMAN'S PROCEDURE, UMBILICAL HERNIA REPAIR ;  Surgeon: Leafy Ro, MD;  Location: [facility] ORS;  Service: General;  Laterality: N/A;  . LAPAROTOMY N/A 02/01/2016   Procedure: EXPLORATORY LAPAROTOMY , drainage of abscess, lysis of ahesions;  Surgeon: Leafy Ro, MD;  Location: [facility] ORS;  Service: General;  Laterality: N/A;   Family History  Problem Relation Age of Onset  . Hypertension Mother   . Parkinsonism Mother   . Heart disease Father   . Coronary artery disease Brother   . Heart attack Brother    History    Sexual Activity  . Sexual activity: Not on file    Outpatient Encounter Prescriptions as of 04/26/2017  Medication Sig  . Calcium Carbonate (CALCIUM 600 PO) Take 900 mg by mouth daily.  . Cholecalciferol (VITAMIN D3) 5000 units CAPS Take 5,000 Units by mouth daily.  . meloxicam (MOBIC) 15 MG tablet Take 1 tablet (15 mg total) by mouth daily.  . vitamin E 400 UNIT capsule Take 1,000 Units by mouth daily.  . [DISCONTINUED] ALPRAZolam (XANAX) 0.5 MG tablet Take 0.5 tablets (0.25 mg total) by mouth 3 (three) times daily as needed for anxiety or sleep.  . [DISCONTINUED] memantine (NAMENDA) 5 MG tablet Take 2 tablets by mouth daily.   No facility-administered encounter medications on file as of 04/26/2017.     Activities of Daily Living In your present state of health, do you have any difficulty performing the following activities: 04/26/2017  Hearing? N  Vision? N  Difficulty concentrating or making decisions? Y  Walking or climbing stairs? N  Dressing or bathing? N  Doing errands, shopping? Y  Preparing Food and eating ? Y  Using the Toilet? N  In the past six months, have you accidently leaked urine? N  Do you have problems with loss of bowel control? N  Managing your Medications? Y  Managing your Finances? Y  Housekeeping or managing your Housekeeping? N  Some recent data might be hidden  Patient Care Team: Malva LimesFisher, Donald E, MD as PCP - General (Family Medicine) Lonell FaceShah, Hemang K, MD as Consulting Physician (Neurology) Jimmye Normanuma, Elizabeth Achieng, NP as Nurse Practitioner (Neurology)    Assessment:     Exercise Activities and Dietary recommendations Current Exercise Habits: Home exercise routine, Type of exercise: walking, Frequency (Times/Week): 7, Intensity: Mild, Exercise limited by: None identified  Goals    . Exercise 150 minutes per week (moderate activity)    . Have 3 meals a day    . Increase water intake          Recommend increasing water intake to 6-8 glasses a day.        Fall Risk Fall Risk  04/26/2017 04/07/2016 04/02/2015  Falls in the past year? No No No   Depression Screen PHQ 2/9 Scores 04/26/2017 04/26/2017 04/07/2016 04/07/2016  PHQ - 2 Score 0 0 3 3  PHQ- 9 Score 0 - 6 6     Cognitive Function     6CIT Screen 04/26/2017  What Year? 0 points  What month? 0 points  What time? 3 points  Count back from 20 0 points  Months in reverse 4 points  Repeat phrase 10 points  Total Score 17    Immunization History  Administered Date(s) Administered  . Influenza, High Dose Seasonal PF 08/17/2016  . Pneumococcal Conjugate-13 08/17/2016  . Pneumococcal Polysaccharide-23 07/27/2012  . Td 10/31/2008  . Tdap 09/06/2012  . Zoster 09/06/2012   Screening Tests Health Maintenance  Topic Date Due  . MAMMOGRAM  04/24/2018 (Originally 05/14/1992)  . INFLUENZA VACCINE  05/25/2017  . COLONOSCOPY  07/09/2017  . DEXA SCAN  01/07/2020  . TETANUS/TDAP  09/06/2022  . PNA vac Low Risk Adult  Completed      Plan:  I have personally reviewed and addressed the Medicare Annual Wellness questionnaire and have noted the following in the patient's chart:  A. Medical and social history B. Use of alcohol, tobacco or illicit drugs  C. Current medications and supplements D. Functional ability and status E.  Nutritional status F.  Physical activity G. Advance directives H. List of other physicians I.  Hospitalizations, surgeries, and ER visits in previous 12 months J.  Vitals K. Screenings such as hearing and vision if needed, cognitive and depression L. Referrals and appointments - none  In addition, I have reviewed and discussed with patient certain preventive protocols, quality metrics, and best practice recommendations. A written personalized care plan for preventive services as well as general preventive health recommendations were provided to patient.  See attached scanned questionnaire for additional information.   Signed,  Hyacinth MeekerMckenzie Saryiah Bencosme, LPN Nurse  Health Advisor   MD Recommendations: None, pt to set up mammogram screening within the next year.

## 2017-04-26 NOTE — Patient Instructions (Signed)
[name] , Thank you for taking time to come for your Medicare Wellness Visit. I appreciate your ongoing commitment to your health goals. Please review the following plan we discussed and let me know if I can assist you in the future.   Screening recommendations/referrals: Colonoscopy: completed 07/10/07, due 06/2017 Mammogram: pt to set up within the next year. Bone Density: completed 01/06/17 Recommended yearly ophthalmology/optometry visit for glaucoma screening and checkup Recommended yearly dental visit for hygiene and checkup  Vaccinations: Influenza vaccine: due 06/2017 Pneumococcal vaccine: completed series Tdap vaccine: completed 09/06/12 Shingles vaccine: completed 09/06/12  Advanced directives: Please bring a copy of your POA (Power of LewisAttorney) and/or Living Will to your next appointment.   Conditions/risks identified: Recommend increasing water intake to 6-8 glasses a day.   Next appointment: None, need to schedule 1 year AWV.   Preventive Care 1865 Years and Older, Female Preventive care refers to lifestyle choices and visits with your health care provider that can promote health and wellness. What does preventive care include?  A yearly physical exam. This is also called an annual well check.  Dental exams once or twice a year.  Routine eye exams. Ask your health care provider how often you should have your eyes checked.  Personal lifestyle choices, including:  Daily care of your teeth and gums.  Regular physical activity.  Eating a healthy diet.  Avoiding tobacco and drug use.  Limiting alcohol use.  Practicing safe sex.  Taking low-dose aspirin every day.  Taking vitamin and mineral supplements as recommended by your health care provider. What happens during an annual well check? The services and screenings done by your health care provider during your annual well check will depend on your age, overall health, lifestyle risk factors, and family  history of disease. Counseling  Your health care provider may ask you questions about your:  Alcohol use.  Tobacco use.  Drug use.  Emotional well-being.  Home and relationship well-being.  Sexual activity.  Eating habits.  History of falls.  Memory and ability to understand (cognition).  Work and work Astronomerenvironment.  Reproductive health. Screening  You may have the following tests or measurements:  Height, weight, and BMI.  Blood pressure.  Lipid and cholesterol levels. These may be checked every 5 years, or more frequently if you are over 75 years old.  Skin check.  Lung cancer screening. You may have this screening every year starting at age 75 if you have a 30-pack-year history of smoking and currently smoke or have quit within the past 15 years.  Fecal occult blood test (FOBT) of the stool. You may have this test every year starting at age 75.  Flexible sigmoidoscopy or colonoscopy. You may have a sigmoidoscopy every 5 years or a colonoscopy every 10 years starting at age 75.  Hepatitis C blood test.  Hepatitis B blood test.  Sexually transmitted disease (STD) testing.  Diabetes screening. This is done by checking your blood sugar (glucose) after you have not eaten for a while (fasting). You may have this done every 1-3 years.  Bone density scan. This is done to screen for osteoporosis. You may have this done starting at age 75.  Mammogram. This may be done every 1-2 years. Talk to your health care provider about how often you should have regular mammograms. Talk with your health care provider about your test results, treatment options, and if necessary, the need for more tests. Vaccines  Your health care provider may recommend certain vaccines,  such as:  Influenza vaccine. This is recommended every year.  Tetanus, diphtheria, and acellular pertussis (Tdap, Td) vaccine. You may need a Td booster every 10 years.  Zoster vaccine. You may need this after  age 39.  Pneumococcal 13-valent conjugate (PCV13) vaccine. One dose is recommended after age 70.  Pneumococcal polysaccharide (PPSV23) vaccine. One dose is recommended after age 92. Talk to your health care provider about which screenings and vaccines you need and how often you need them. This information is not intended to replace advice given to you by your health care provider. Make sure you discuss any questions you have with your health care provider. Document Released: 11/07/2015 Document Revised: 06/30/2016 Document Reviewed: 08/12/2015 Elsevier Interactive Patient Education  2017 [place] Prevention in the Home Falls can cause injuries. They can happen to people of all ages. There are many things you can do to make your home safe and to help prevent falls. What can I do on the outside of my home?  Regularly fix the edges of walkways and driveways and fix any cracks.  Remove anything that might make you trip as you walk through a door, such as a raised step or threshold.  Trim any bushes or trees on the path to your home.  Use bright outdoor lighting.  Clear any walking paths of anything that might make someone trip, such as rocks or tools.  Regularly check to see if handrails are loose or broken. Make sure that both sides of any steps have handrails.  Any raised decks and porches should have guardrails on the edges.  Have any leaves, snow, or ice cleared regularly.  Use sand or salt on walking paths during winter.  Clean up any spills in your garage right away. This includes oil or grease spills. What can I do in the bathroom?  Use night lights.  Install grab bars by the toilet and in the tub and shower. Do not use towel bars as grab bars.  Use non-skid mats or decals in the tub or shower.  If you need to sit down in the shower, use a plastic, non-slip stool.  Keep the floor dry. Clean up any water that spills on the floor as soon as it  happens.  Remove soap buildup in the tub or shower regularly.  Attach bath mats securely with double-sided non-slip rug tape.  Do not have throw rugs and other things on the floor that can make you trip. What can I do in the bedroom?  Use night lights.  Make sure that you have a light by your bed that is easy to reach.  Do not use any sheets or blankets that are too big for your bed. They should not hang down onto the floor.  Have a firm chair that has side arms. You can use this for support while you get dressed.  Do not have throw rugs and other things on the floor that can make you trip. What can I do in the kitchen?  Clean up any spills right away.  Avoid walking on wet floors.  Keep items that you use a lot in easy-to-reach places.  If you need to reach something above you, use a strong step stool that has a grab bar.  Keep electrical cords out of the way.  Do not use floor polish or wax that makes floors slippery. If you must use wax, use non-skid floor wax.  Do not have throw rugs and other things on  the floor that can make you trip. What can I do with my stairs?  Do not leave any items on the stairs.  Make sure that there are handrails on both sides of the stairs and use them. Fix handrails that are broken or loose. Make sure that handrails are as long as the stairways.  Check any carpeting to make sure that it is firmly attached to the stairs. Fix any carpet that is loose or worn.  Avoid having throw rugs at the top or bottom of the stairs. If you do have throw rugs, attach them to the floor with carpet tape.  Make sure that you have a light switch at the top of the stairs and the bottom of the stairs. If you do not have them, ask someone to add them for you. What else can I do to help prevent falls?  Wear shoes that:  Do not have high heels.  Have rubber bottoms.  Are comfortable and fit you well.  Are closed at the toe. Do not wear sandals.  If you  use a stepladder:  Make sure that it is fully opened. Do not climb a closed stepladder.  Make sure that both sides of the stepladder are locked into place.  Ask someone to hold it for you, if possible.  Clearly mark and make sure that you can see:  Any grab bars or handrails.  First and last steps.  Where the edge of each step is.  Use tools that help you move around (mobility aids) if they are needed. These include:  Canes.  Walkers.  Scooters.  Crutches.  Turn on the lights when you go into a dark area. Replace any light bulbs as soon as they burn out.  Set up your furniture so you have a clear path. Avoid moving your furniture around.  If any of your floors are uneven, fix them.  If there are any pets around you, be aware of where they are.  Review your medicines with your doctor. Some medicines can make you feel dizzy. This can increase your chance of falling. Ask your doctor what other things that you can do to help prevent falls. This information is not intended to replace advice given to you by your health care provider. Make sure you discuss any questions you have with your health care provider. Document Released: 08/07/2009 Document Revised: 03/18/2016 Document Reviewed: 11/15/2014 Elsevier Interactive Patient Education  2017 [employer].

## 2017-04-26 NOTE — Progress Notes (Signed)
Patient: Katherine Wood, Female    DOB: 09-Nov-1940, 76 y.o.   MRN: [id number] Visit Date: 04/26/2017  Today's Provider: Mila Merry, MD   Chief Complaint  Patient presents with  . Annual Exam  . Osteoporosis  . Hyperlipidemia   Subjective:    Annual Physical examination: Katherine Wood is a 76 y.o. female. She feels fairly well. She reports exercising daily. She reports she is sleeping fairly well. She continues to follow up with Dr. Sherryll Burger for  ----------------------------------------------------------- Follow up of Osteoporosis:  Patient was last seen for this problem 6 months ago. Management during that visit includes referring patient to Endocrinology due to worsening Osteoporosis. She has since started Prolia and which she is tolerating well.   Follow up of Vitamin D Deficiency:  Patient was last seen for this problem 6 months ago. Changes made since that visit includes increasing Vitamin D3 to 5,000 units daily. Patient reports good compliance with treatment and good tolerance. Lab Results  Component Value Date   VD25OH 25.3 (L) 02/07/2017       Lipid/Cholesterol, Follow-up:   Last seen for this 6 months ago.  Management changes since that visit include none. . Last Lipid Panel:    Component Value Date/Time   CHOL 210 (A) 08/01/2012   TRIG 111 01/29/2016 0434   HDL 82 (A) 08/01/2012   LDLCALC 113 08/01/2012    Risk factors for vascular disease include hypercholesterolemia  She reports good compliance with treatment. She is not having side effects.  Current symptoms include none and have been stable. Weight trend: stable Prior visit with dietician: no Current diet: in general, a "healthy" diet   Current exercise: walking  Wt Readings from Last 3 Encounters:  04/26/17 120 lb 6.4 oz (54.6 kg)  11/17/16 123 lb (55.8 kg)  08/17/16 117 lb (53.1 kg)    -------------------------------------------------------------------   Review of  Systems  Constitutional: Negative for chills, fatigue and fever.  HENT: Negative for congestion, ear pain, rhinorrhea, sneezing and sore throat.   Eyes: Negative.  Negative for pain and redness.  Respiratory: Negative for cough, shortness of breath and wheezing.   Cardiovascular: Negative for chest pain and leg swelling.  Gastrointestinal: Negative for abdominal pain, blood in stool, constipation, diarrhea and nausea.  Endocrine: Negative for polydipsia and polyphagia.  Genitourinary: Negative.  Negative for dysuria, flank pain, hematuria, pelvic pain, vaginal bleeding and vaginal discharge.  Musculoskeletal: Negative for arthralgias, back pain, gait problem and joint swelling.  Skin: Negative for rash.  Neurological: Negative.  Negative for dizziness, tremors, seizures, weakness, light-headedness, numbness and headaches.  Hematological: Negative for adenopathy.  Psychiatric/Behavioral: Negative.  Negative for behavioral problems, confusion and dysphoric mood. The patient is not nervous/anxious and is not hyperactive.     Social History   Social History  . Marital status: Married    Spouse name: Renae Fickle  . Number of children: 2  . Years of education: N/A   Occupational History  . Not on file.   Social History Main Topics  . Smoking status: Never Smoker  . Smokeless tobacco: Never Used  . Alcohol use No  . Drug use: No  . Sexual activity: Not on file   Other Topics Concern  . Not on file   Social History Narrative  . No narrative on file    Past Medical History:  Diagnosis Date  . Allergy   . Anxiety   . Clinical depression 02/13/2015  . Depression   .  Enterococcus, vancomycin-resistant 02/25/2016  . Hyperlipidemia   . Osteoporosis      Patient Active Problem List   Diagnosis Date Noted  . Allergy   . Mild single current episode of major depressive disorder (HCC) 12/07/2016  . History of creation of ostomy (HCC) 08/17/2016  . Anemia 04/07/2016  . Diverticulitis  large intestine 02/25/2016  . Hernia of abdominal cavity   . Alzheimer's dementia 07/27/2015  . Memory loss 05/16/2015  . Narrowing of intervertebral disc space 02/13/2015  . Hypercholesteremia 02/13/2015  . Symptomatic menopausal or female climacteric states 02/13/2015  . Arthritis, degenerative 02/13/2015  . Vitamin D deficiency 02/13/2015  . Connective tissue and disc stenosis of intervertebral foramina of abdomen and other regions 02/13/2015  . Anxiety state 10/31/2008  . Arthropathia 10/30/2008  . Leg varices 10/30/2008  . Acquired cyst of kidney 10/30/2008  . Insomnia 10/30/2008  . OP (osteoporosis) 10/30/2008  . Cystocele, midline 10/30/2008    Past Surgical History:  Procedure Laterality Date  . ABDOMINAL HYSTERECTOMY  1989  . CATARACT EXTRACTION Bilateral 08/2012   [facility]- Dr. Jettie Pagan  . INCISION AND DRAINAGE ABSCESS N/A 02/01/2016   Procedure: INCISION AND DRAINAGE ABSCESS-OPEN;  Surgeon: Leafy Ro, MD;  Location: [facility] ORS;  Service: General;  Laterality: N/A;  . LAPAROSCOPY N/A 02/01/2016   Procedure: Attempted laparoscopic,;  Surgeon: Leafy Ro, MD;  Location: [facility] ORS;  Service: General;  Laterality: N/A;  . LAPAROTOMY N/A 01/18/2016   Procedure: EXPLORATORY LAPAROTOMY, LYSIS OF ADHESIONS, DRAINAGE OF INTRABDOMINAL ABSCESS, HARTMAN'S PROCEDURE, UMBILICAL HERNIA REPAIR ;  Surgeon: Leafy Ro, MD;  Location: [facility] OR[facility];  Service: General;  Laterality: N/A;  . LAPAROTOMY N/A 02/01/2016   Procedure: EXPLORATORY LAPAROTOMY , drainage of abscess, lysis of ahesions;  Surgeon: Leafy Ro, MD;  Location: [facility] OR[facility];  Service: General;  Laterality: N/A;    Her family history includes Coronary artery disease in her brother; Heart attack in her brother; Heart disease in her father; Hypertension in her mother; Parkinsonism in her mother.      Current Outpatient Prescriptions:  .  Calcium Carbonate (CALCIUM 600 PO), Take 900 mg by mouth daily., Disp: , Rfl:  .   Cholecalciferol (VITAMIN D3) 5000 units CAPS, Take 5,000 Units by mouth daily., Disp: , Rfl:  .  denosumab (PROLIA) 60 MG/ML SOLN injection, Inject 60 mg into the skin every 6 (six) months. Administer in upper arm, thigh, or abdomen, Disp: , Rfl:  .  meloxicam (MOBIC) 15 MG tablet, Take 1 tablet (15 mg total) by mouth daily., Disp: 30 tablet, Rfl: 5 .  vitamin E 400 UNIT capsule, Take 1,000 Units by mouth daily., Disp: , Rfl:   Patient Care Team: Malva Limes, MD as PCP - General (Family Medicine)     Objective:   Vitals:  Vital Signs - Last Recorded  Most recent update: 04/26/2017 1:42 PM by Hyacinth Meeker, LPN  BP  161/09 (BP Location: Left Arm)     Pulse  76     Temp  97.7 F (36.5 C) (Oral)     Ht  5\' 4"  (1.626 m)     Wt  120 lb 6.4 oz (54.6 kg)      BMI  20.67 kg/m       Physical Exam    General Appearance:    Alert, cooperative, no distress, appears stated age  Head:    Normocephalic, without obvious abnormality, atraumatic  Eyes:    PERRL, conjunctiva/corneas clear, EOM's  intact, fundi    benign, both eyes  Ears:    Normal TM's and external ear canals, both ears  Nose:   Nares normal, septum midline, mucosa normal, no drainage    or sinus tenderness  Throat:   Lips, mucosa, and tongue normal; teeth and gums normal  Neck:   Supple, symmetrical, trachea midline, no adenopathy;    thyroid:  no enlargement/tenderness/nodules; no carotid   bruit or JVD  Back:     Symmetric, no curvature, ROM normal, no CVA tenderness  Lungs:     Clear to auscultation bilaterally, respirations unlabored  Chest Wall:    No tenderness or deformity   Heart:    Regular rate and rhythm, S1 and S2 normal, no murmur, rub   or gallop  Breast Exam:    normal appearance, no masses or tenderness  Abdomen:     Soft, non-tender, bowel sounds active all four quadrants,    no masses, no organomegaly  Pelvic:    deferred  Extremities:   Extremities normal, atraumatic, no cyanosis  or edema  Pulses:   2+ and symmetric all extremities  Skin:   Skin color, texture, turgor normal, no rashes or lesions  Lymph nodes:   Cervical, supraclavicular, and axillary nodes normal  Neurologic:   CNII-XII intact, normal strength, sensation and reflexes    throughout    Activities of Daily Living In your present state of health, do you have any difficulty performing the following activities: 04/26/2017  Hearing? N  Vision? N  Difficulty concentrating or making decisions? Y  Walking or climbing stairs? N  Dressing or bathing? N  Doing errands, shopping? Y  Preparing Food and eating ? Y  Using the Toilet? N  In the past six months, have you accidently leaked urine? N  Do you have problems with loss of bowel control? N  Managing your Medications? Y  Managing your Finances? Y  Housekeeping or managing your Housekeeping? N  Some recent data might be hidden    Fall Risk Assessment Fall Risk  04/26/2017 04/07/2016 04/02/2015  Falls in the past year? No No No     Depression Screen PHQ 2/9 Scores 04/26/2017 04/26/2017 04/07/2016 04/07/2016  PHQ - 2 Score 0 0 3 3  PHQ- 9 Score 0 - 6 6    Cognitive Testing - 6-CIT Cognitive Function 6CIT Screen 04/26/2017  What Year? 0 points  What month? 0 points  What time? 3 points  Count back from 20 0 points  Months in reverse 4 points  Repeat phrase 10 points  Total Score 17       Assessment & Plan:    Annual Physical Reviewed patient's Family Medical History Reviewed and updated list of patient's medical providers Assessment of cognitive impairment was done Assessed patient's functional ability Established a written schedule for health screening services Health Risk Assessent Completed and Reviewed  Exercise Activities and Dietary recommendations Goals    . Exercise 150 minutes per week (moderate activity)    . Have 3 meals a day       Immunization History  Administered Date(s) Administered  . Influenza, High Dose Seasonal  PF 08/17/2016  . Pneumococcal Conjugate-13 08/17/2016  . Pneumococcal Polysaccharide-23 07/27/2012  . Td 10/31/2008  . Tdap 09/06/2012  . Zoster 09/06/2012    Health Maintenance  Topic Date Due  . MAMMOGRAM  05/14/1992  . INFLUENZA VACCINE  05/25/2017  . COLONOSCOPY  07/09/2017  . DEXA SCAN  01/07/2020  . TETANUS/TDAP  09/06/2022  .  PNA vac Low Risk Adult  Completed     Discussed health benefits of physical activity, and encouraged her to engage in regular exercise appropriate for her age and condition.    ------------------------------------------------------------------------------------------------------------  1. Annual physical exam She declined mammogram, but breast exam is normal.   2. Colon cancer screening  - Cologuard  3. Alzheimer's dementia without behavioral disturbance, unspecified timing of dementia onset Continue routine follow up neurology.   4. Osteoporosis, unspecified osteoporosis type, unspecified pathological fracture presence Doing well since starting Prolia.   5. Hypercholesteremia Due for  - Lipid panel  6. Vitamin D deficiency Tolerating supplemental vitamin D - Vitamin D (25 hydroxy)  7. Screening for Colon cancer  - Cologuard  The entirety of the information documented in the History of Present Illness, Review of Systems and Physical Exam were personally obtained by me. Portions of this information were initially documented by Awilda Bill, CMA and reviewed by me for thoroughness and accuracy.     Mila Merry, MD  [facility]

## 2017-04-29 ENCOUNTER — Telehealth: Payer: Self-pay | Admitting: Family Medicine

## 2017-04-29 LAB — LIPID PANEL
CHOLESTEROL TOTAL: 207 mg/dL — AB (ref 100–199)
Chol/HDL Ratio: 2.3 ratio (ref 0.0–4.4)
HDL: 89 mg/dL (ref >39)
LDL Calculated: 99 mg/dL (ref 0–99)
TRIGLYCERIDES: 95 mg/dL (ref 0–149)
VLDL CHOLESTEROL CAL: 19 mg/dL (ref 5–40)

## 2017-04-29 LAB — VITAMIN D 25 HYDROXY (VIT D DEFICIENCY, FRACTURES): VIT D 25 HYDROXY: 30.7 ng/mL (ref 30.0–100.0)

## 2017-04-29 NOTE — Telephone Encounter (Signed)
Order for cologuard faxed to [facility] °

## 2017-05-10 LAB — COLOGUARD

## 2017-05-11 LAB — COLOGUARD: Cologuard: NEGATIVE

## 2017-05-23 ENCOUNTER — Encounter: Payer: Self-pay | Admitting: Family Medicine

## 2017-08-15 ENCOUNTER — Other Ambulatory Visit: Payer: Self-pay | Admitting: Family Medicine

## 2017-08-15 DIAGNOSIS — M150 Primary generalized (osteo)arthritis: Principal | ICD-10-CM

## 2017-08-15 DIAGNOSIS — M159 Polyosteoarthritis, unspecified: Secondary | ICD-10-CM

## 2017-11-17 ENCOUNTER — Telehealth: Payer: Self-pay | Admitting: Family Medicine

## 2017-11-17 NOTE — Telephone Encounter (Signed)
Please review. Thanks!  

## 2017-11-17 NOTE — Telephone Encounter (Signed)
Pt's husband Renae Fickleaul dropped off prescription for ostomy supplies that was written  2017.He would like this rewritten.[facility] [place]

## 2017-11-27 IMAGING — CT CT ABD-PELV W/ CM
1 of 3 series · 13 of 32 positions shown, 18 images · IV contrast (iopamidol)
Comparison: CT dated 10/09/2007

ADDENDUM:
These results were called by telephone at the time of interpretation
on 01/18/2016 at [DATE] to Dr. CHE WAH BILLIE , who verbally
acknowledged these results.
CLINICAL DATA: 74-year-old female with abdominal pain and diarrhea.
No surgery.

EXAM:
CT ABDOMEN AND PELVIS WITH CONTRAST
TECHNIQUE: Multidetector CT imaging of the abdomen and pelvis was performed
using the standard protocol following bolus administration of
intravenous contrast.
CONTRAST:  75mL [id number] IOPAMIDOL ([id number]) INJECTION 61%

[Series 2: routine abd pel with · axial · 0.61mm/px · z∈[-460,-66]mm · 13 of 89 slices shown, 18 images]
[im 5/89  soft-tissue]
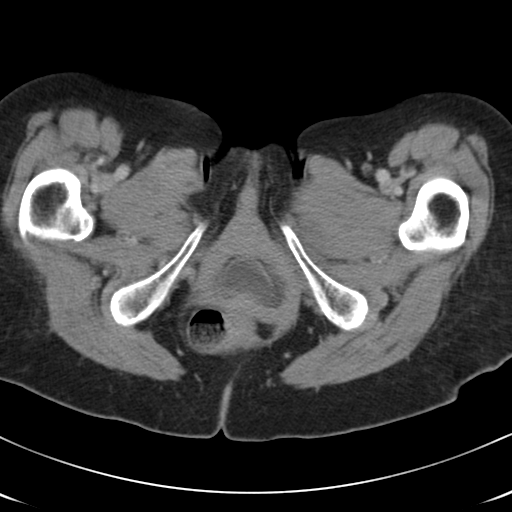
[im 5/89  bone]
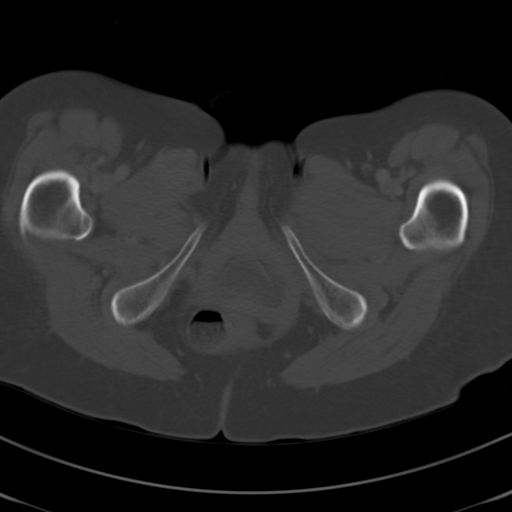
[im 14/89  soft-tissue]
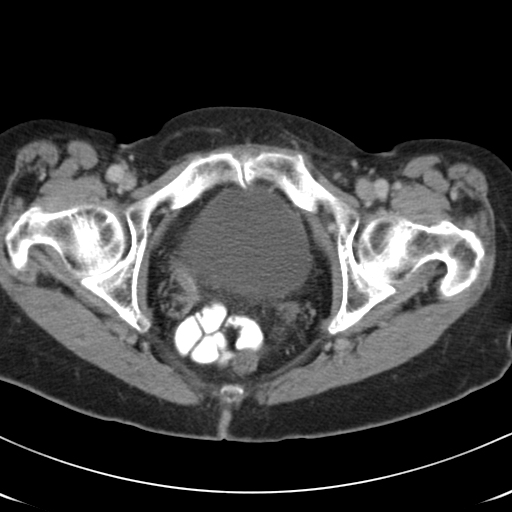
[im 18/89  soft-tissue]
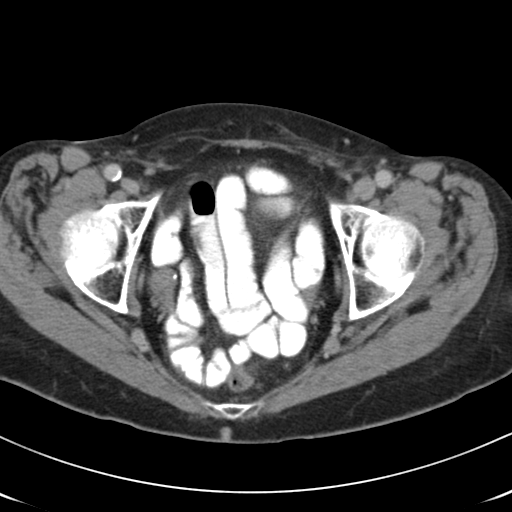
[im 27/89  soft-tissue]
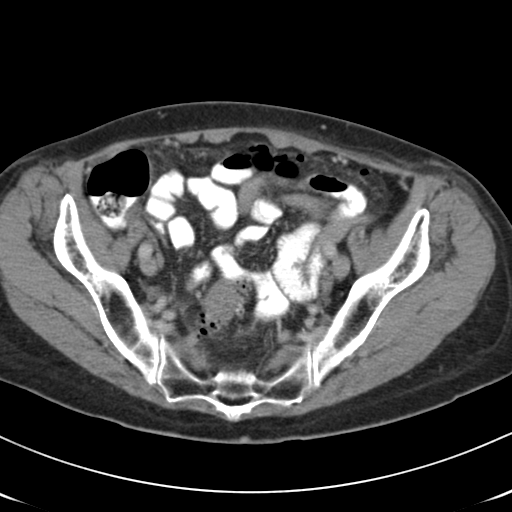
[im 36/89  soft-tissue]
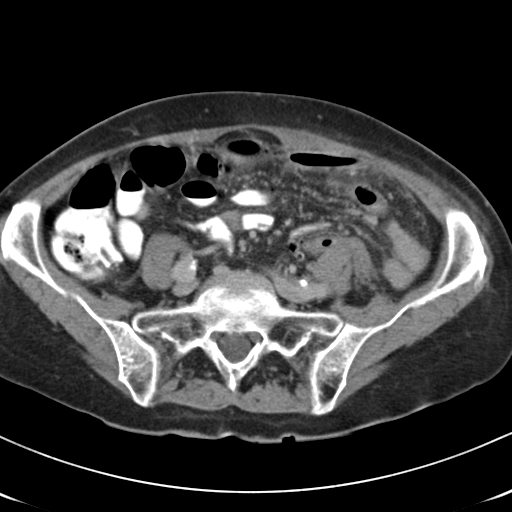
[im 40/89  soft-tissue]
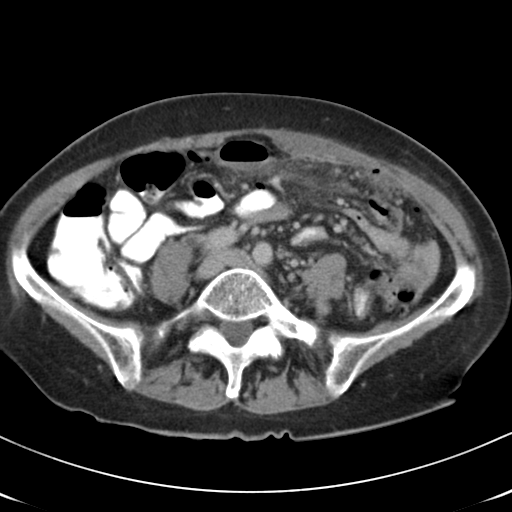
[im 49/89  soft-tissue]
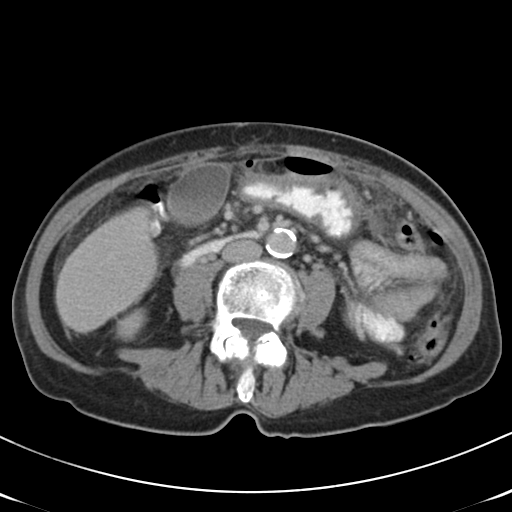
[im 53/89  soft-tissue]
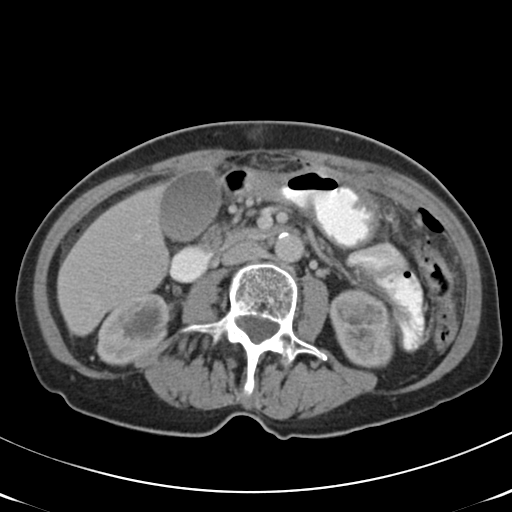
[im 62/89  soft-tissue]
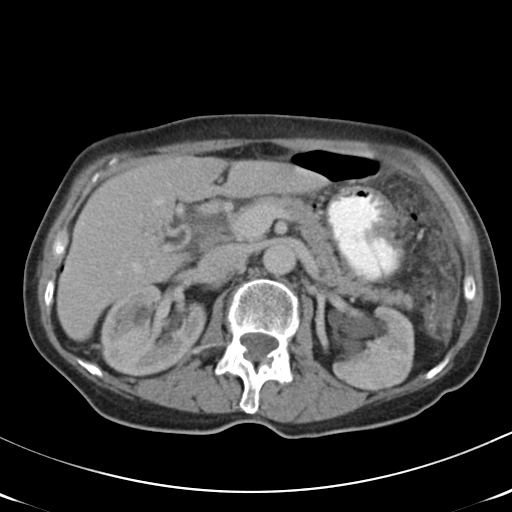
[im 62/89  bone]
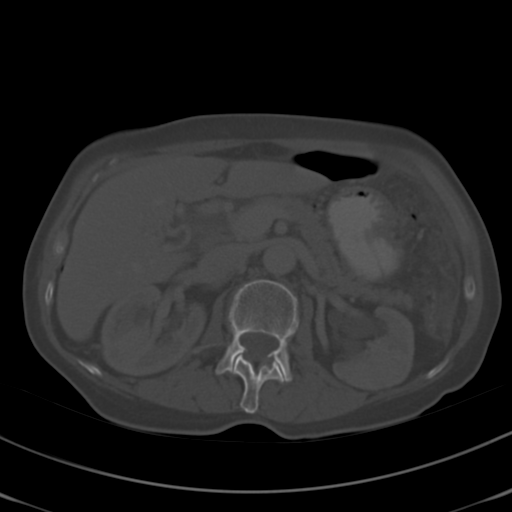
[im 71/89  soft-tissue]
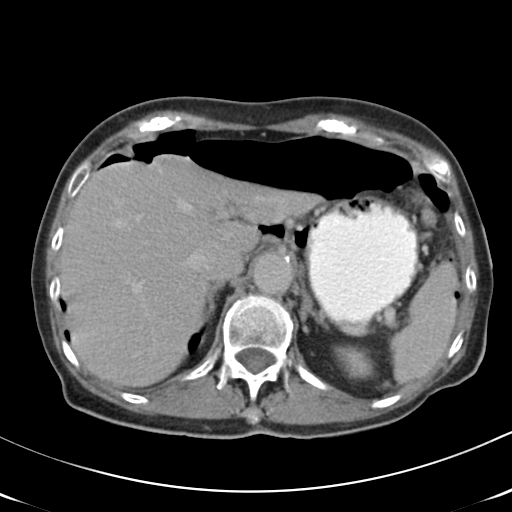
[im 71/89  lung]
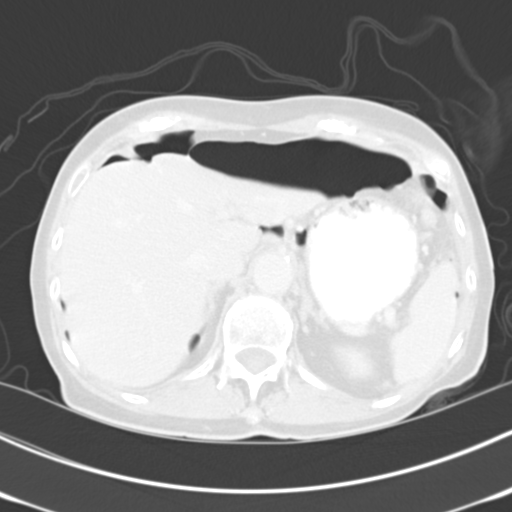
[im 75/89  soft-tissue]
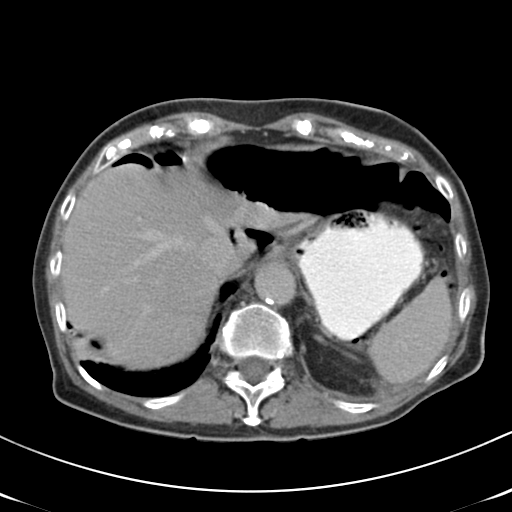
[im 75/89  lung]
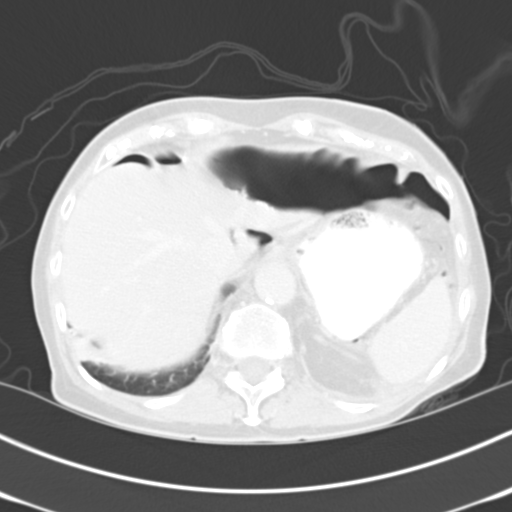
[im 80/89  lung]
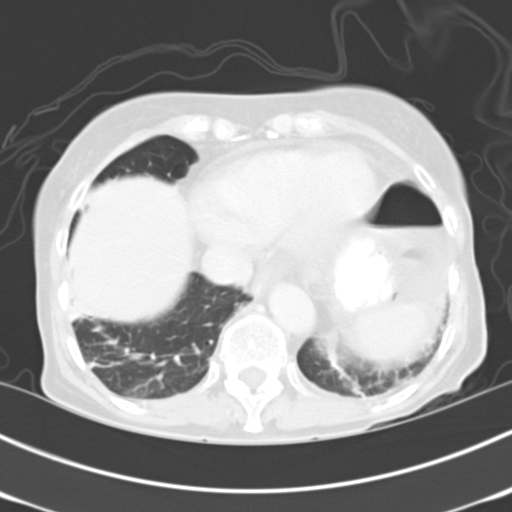
[im 84/89  soft-tissue]
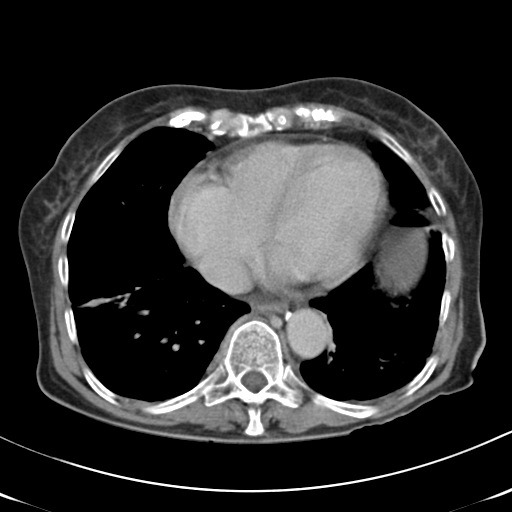
[im 84/89  lung]
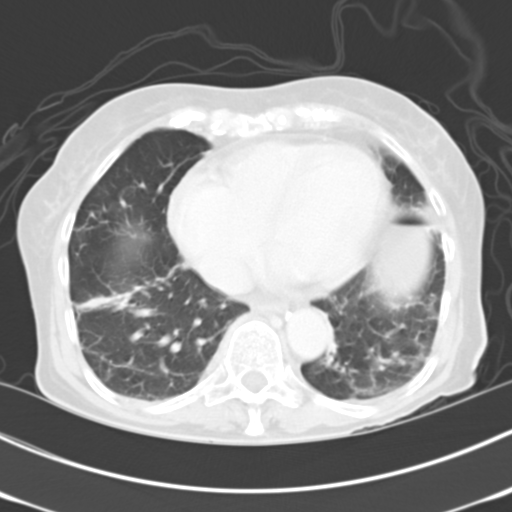

[13 of 32 positions shown; findings below may reference images not displayed]

FINDINGS: Linear bibasilar atelectasis/ scarring.

There is large pneumoperitoneum. There is diffuse stranding of the
mesentery and omental fat. Small ascites.

Mild apparent fatty infiltration of the liver. A 1 cm hypodense
lesion in the left lobe of the liver is not well characterized but
likely represents a cyst or hemangioma. The gallbladder is mildly
distended. No calcified gallstone identified. The pancreas, spleen,
adrenal glands appear unremarkable. There bilateral renal parapelvic
cysts. Multiple smaller renal hypodense lesions are not well
characterized but likely represent cysts. There is no hydronephrosis
on either side. The visualized ureters and urinary bladder appear
unremarkable. Hysterectomy.

There is sigmoid diverticulosis with muscular hypertrophy. No active
inflammatory changes. There is inflammatory changes and thickening
of the body, and antrum of the stomach. Multiple loculated
extraluminal air noted in the anterior abdomen and along the greater
curvature of the stomach. No extraluminal extravasation of oral
contrast identified. There is no evidence of bowel obstruction. The
visualized appendix appears unremarkable.

There is mild aortoiliac atherosclerotic disease. No portal venous
gas identified. There is no adenopathy. There is a small fat
containing umbilical hernia. There is mild osteopenia and
degenerative changes of the spine. No acute fracture.
IMPRESSION: Large amount of pneumoperitoneum most concerning for bowel
perforation. The site of presumed bowel perforation is not
identified with certainty on the CT. There is thickening of the body
and antrum of the stomach. No extraluminal oral contrast identified.
Clinical correlation and surgical consult is advised.

Sigmoid diverticulosis without active inflammatory changes.

## 2018-03-08 ENCOUNTER — Telehealth: Payer: Self-pay | Admitting: Family Medicine

## 2018-05-25 ENCOUNTER — Ambulatory Visit (INDEPENDENT_AMBULATORY_CARE_PROVIDER_SITE_OTHER): Payer: Medicare Other

## 2018-05-25 VITALS — BP 98/54 | HR 82 | Temp 98.2°F | Ht 67.0 in | Wt 125.8 lb

## 2018-05-25 DIAGNOSIS — Z0000 Encounter for general adult medical examination without abnormal findings: Secondary | ICD-10-CM | POA: Diagnosis not present

## 2018-05-25 NOTE — Progress Notes (Signed)
Subjective:   Katherine Wood is a 77 y.o. female who presents for Medicare Annual (Subsequent) preventive examination.  Review of Systems:  N/A   Cardiac Risk Factors include: advanced age (>54men, >21 women)     Objective:     Vitals: BP (!) 98/54 (BP Location: Right Arm)   Pulse 82   Temp 98.2 F (36.8 C) (Oral)   Ht 5\' 7"  (1.702 m)   Wt 125 lb 12.8 oz (57.1 kg)   BMI 19.70 kg/m   Body mass index is 19.7 kg/m.  Advanced Directives 05/25/2018 04/26/2017 03/26/2016 03/02/2016 01/21/2016 01/12/2016 04/02/2015  Does Patient Have a Medical Advance Directive? Yes Yes Yes Yes No Yes Yes  Type of Advance Directive Living will Healthcare Power of [place];Living will Healthcare Power of [place];Living will Living will;Healthcare Power of Attorney - Living will;Healthcare Power of [employer] Power of Attorney  Copy of Healthcare Power of Attorney in Chart? - No - copy requested - - - - -    Tobacco Social History   Tobacco Use  Smoking Status Never Smoker  Smokeless Tobacco Never Used     Counseling given: Not Answered   Clinical Intake:  Pre-visit preparation completed: Yes  Pain : No/denies pain Pain Score: 0-No pain     Nutritional Status: BMI of 19-24  Normal Nutritional Risks: None Diabetes: No  How often do you need to have someone help you when you read instructions, pamphlets, or other written materials from your doctor or pharmacy?: 1 - Never  Interpreter Needed?: No  Information entered by :: [facility], LPN  Past Medical History:  Diagnosis Date  . Clinical depression 02/13/2015  . Diverticulitis of colon with perforation 01/19/2016  . Enterococcus, vancomycin-resistant 02/25/2016  . Hypertension    Past Surgical History:  Procedure Laterality Date  . ABDOMINAL HYSTERECTOMY  1989  . CATARACT EXTRACTION Bilateral 08/2012   [facility]- Dr. Jettie Pagan  . INCISION AND DRAINAGE ABSCESS N/A 02/01/2016   Procedure: INCISION AND DRAINAGE  ABSCESS-OPEN;  Surgeon: Leafy Ro, MD;  Location: [facility] ORS;  Service: General;  Laterality: N/A;  . LAPAROSCOPY N/A 02/01/2016   Procedure: Attempted laparoscopic,;  Surgeon: Leafy Ro, MD;  Location: [facility] ORS;  Service: General;  Laterality: N/A;  . LAPAROTOMY N/A 01/18/2016   Procedure: EXPLORATORY LAPAROTOMY, LYSIS OF ADHESIONS, DRAINAGE OF INTRABDOMINAL ABSCESS, HARTMAN'S PROCEDURE, UMBILICAL HERNIA REPAIR ;  Surgeon: Leafy Ro, MD;  Location: [facility] ORS;  Service: General;  Laterality: N/A;  . LAPAROTOMY N/A 02/01/2016   Procedure: EXPLORATORY LAPAROTOMY , drainage of abscess, lysis of ahesions;  Surgeon: Leafy Ro, MD;  Location: [facility] ORS;  Service: General;  Laterality: N/A;   Family History  Problem Relation Age of Onset  . Hypertension Mother   . Parkinsonism Mother   . Heart disease Father   . Coronary artery disease Brother   . Heart attack Brother    Social History   Socioeconomic History  . Marital status: Married    Spouse name: Renae Fickle  . Number of children: 2  . Years of education: Not on file  . Highest education level: Bachelor's degree (e.g., BA, AB, BS)  Occupational History  . Not on file  Social Needs  . Financial resource strain: Not hard at all  . Food insecurity:    Worry: Never true    Inability: Never true  . Transportation needs:    Medical: No    Non-medical: No  Tobacco Use  . Smoking status:  Never Smoker  . Smokeless tobacco: Never Used  Substance and Sexual Activity  . Alcohol use: No    Alcohol/week: 0.0 oz  . Drug use: No  . Sexual activity: Not on file  Lifestyle  . Physical activity:    Days per week: Not on file    Minutes per session: Not on file  . Stress: Not at all  Relationships  . Social connections:    Talks on phone: Not on file    Gets together: Not on file    Attends religious service: Not on file    Active member of club or organization: Not on file    Attends meetings of clubs or organizations: Not on file     Relationship status: Not on file  Other Topics Concern  . Not on file  Social History Narrative  . Not on file    Outpatient Encounter Medications as of 05/25/2018  Medication Sig  . Calcium Carbonate (CALCIUM 600 PO) Take 900 mg by mouth daily.  . Cholecalciferol (VITAMIN D3) 5000 units CAPS Take 5,000 Units by mouth daily.  Marland Kitchen denosumab (PROLIA) 60 MG/ML SOLN injection Inject 60 mg into the skin every 6 (six) months. Administer in upper arm, thigh, or abdomen  . meloxicam (MOBIC) 15 MG tablet TAKE 1 TABLET(15 MG) BY MOUTH DAILY  . vitamin E 400 UNIT capsule Take 1,000 Units by mouth daily.   No facility-administered encounter medications on file as of 05/25/2018.     Activities of Daily Living In your present state of health, do you have any difficulty performing the following activities: 05/25/2018  Hearing? N  Vision? N  Difficulty concentrating or making decisions? Y  Walking or climbing stairs? N  Dressing or bathing? N  Doing errands, shopping? N  Preparing Food and eating ? N  Using the Toilet? N  In the past six months, have you accidently leaked urine? N  Do you have problems with loss of bowel control? N  Comment Has an ostomy bag.  Managing your Medications? N  Managing your Finances? N  Housekeeping or managing your Housekeeping? N  Some recent data might be hidden    Patient Care Team: Malva Limes, MD as PCP - General (Family Medicine) Lonell Face, MD as Consulting Physician (Neurology) Jimmye Norman, NP as Nurse Practitioner (Neurology)    Assessment:   This is a routine wellness examination for [place].  Exercise Activities and Dietary recommendations Current Exercise Habits: Home exercise routine, Time (Minutes): 20(multiple times walking dogs), Frequency (Times/Week): 7, Weekly Exercise (Minutes/Week): 140, Intensity: Mild, Exercise limited by: None identified  Goals    . Exercise 150 minutes per week (moderate activity)    . Have 3  meals a day       Fall Risk Fall Risk  05/25/2018 04/26/2017 04/07/2016 04/02/2015  Falls in the past year? No No No No   Is the patient's home free of loose throw rugs in walkways, pet beds, electrical cords, etc?   yes      Grab bars in the bathroom? no      Handrails on the stairs?   yes      Adequate lighting?   yes  Timed Get Up and Go performed: N/A  Depression Screen PHQ 2/9 Scores 05/25/2018 05/25/2018 04/26/2017 04/26/2017  PHQ - 2 Score 0 0 0 0  PHQ- 9 Score 0 - 0 -     Cognitive Function: Pt declined screening today.      6CIT  Screen 04/26/2017  What Year? 0 points  What month? 0 points  What time? 3 points  Count back from 20 0 points  Months in reverse 4 points  Repeat phrase 10 points  Total Score 17    Immunization History  Administered Date(s) Administered  . Influenza, High Dose Seasonal PF 08/17/2016, 09/05/2017  . Pneumococcal Conjugate-13 08/17/2016  . Pneumococcal Polysaccharide-23 07/27/2012  . Td 10/31/2008  . Tdap 09/06/2012  . Zoster 09/06/2012    Qualifies for Shingles Vaccine? Due for Shingles vaccine. Declined my offer to administer today. Education has been provided regarding the importance of this vaccine. Pt has been advised to call her insurance company to determine her out of pocket expense. Advised she may also receive this vaccine at her local pharmacy or Health Dept. Verbalized acceptance and understanding.  Screening Tests Health Maintenance  Topic Date Due  . INFLUENZA VACCINE  05/25/2018  . DEXA SCAN  01/07/2020  . TETANUS/TDAP  09/06/2022  . PNA vac Low Risk Adult  Completed    Cancer Screenings: Lung: Low Dose CT Chest recommended if Age 53-80 years, 30 pack-year currently smoking OR have quit w/in 15years. Patient does not qualify. Breast:  Up to date on Mammogram? Yes   Up to date of Bone Density/Dexa? Yes Colorectal: Up to date  Additional Screenings:  Hepatitis C Screening: Up to date     Plan:  I have personally reviewed  and addressed the Medicare Annual Wellness questionnaire and have noted the following in the patient's chart:  A. Medical and social history B. Use of alcohol, tobacco or illicit drugs  C. Current medications and supplements D. Functional ability and status E.  Nutritional status F.  Physical activity G. Advance directives H. List of other physicians I.  Hospitalizations, surgeries, and ER visits in previous 12 months J.  Vitals K. Screenings such as hearing and vision if needed, cognitive and depression L. Referrals and appointments - none  In addition, I have reviewed and discussed with patient certain preventive protocols, quality metrics, and best practice recommendations. A written personalized care plan for preventive services as well as general preventive health recommendations were provided to patient.  See attached scanned questionnaire for additional information.   Signed,  Hyacinth MeekerMckenzie Jamarien Rodkey, LPN Nurse Health Advisor   Nurse Recommendations: None.

## 2018-05-25 NOTE — Patient Instructions (Signed)
[name] , Thank you for taking time to come for your Medicare Wellness Visit. I appreciate your ongoing commitment to your health goals. Please review the following plan we discussed and let me know if I can assist you in the future.   Screening recommendations/referrals: Colonoscopy: Up to date Mammogram: Up to date Bone Density: Up to date Recommended yearly ophthalmology/optometry visit for glaucoma screening and checkup Recommended yearly dental visit for hygiene and checkup  Vaccinations: Influenza vaccine: Up to date Pneumococcal vaccine: Up to date Tdap vaccine: Up to date Shingles vaccine: Pt declines today.     Advanced directives: Please bring a copy of your POA (Power of Attorney) and/or Living Will to your next appointment.   Conditions/risks identified: None.  Next appointment: None, pt declined scheduling CPE or AWV for 2020 at this time.    Preventive Care 4165 Years and Older, Female Preventive care refers to lifestyle choices and visits with your health care provider that can promote health and wellness. What does preventive care include?  A yearly physical exam. This is also called an annual well check.  Dental exams once or twice a year.  Routine eye exams. Ask your health care provider how often you should have your eyes checked.  Personal lifestyle choices, including:  Daily care of your teeth and gums.  Regular physical activity.  Eating a healthy diet.  Avoiding tobacco and drug use.  Limiting alcohol use.  Practicing safe sex.  Taking low-dose aspirin every day.  Taking vitamin and mineral supplements as recommended by your health care provider. What happens during an annual well check? The services and screenings done by your health care provider during your annual well check will depend on your age, overall health, lifestyle risk factors, and family history of disease. Counseling  Your health care provider may ask you questions about  your:  Alcohol use.  Tobacco use.  Drug use.  Emotional well-being.  Home and relationship well-being.  Sexual activity.  Eating habits.  History of falls.  Memory and ability to understand (cognition).  Work and work Astronome[employer]vironment.  Reproductive health. Screening  You may have the following tests or measurements:  Height, weight, and BMI.  Blood pressure.  Lipid and cholesterol levels. These may be checked every 5 years, or more frequently if you are over 76 years old.  Skin check.  Lung cancer screening. You may have this screening every year starting at age 76 if you have a 30-pack-year history of smoking and currently smoke or have quit within the past 15 years.  Fecal occult blood test (FOBT) of the stool. You may have this test every year starting at age 76.  Flexible sigmoidoscopy or colonoscopy. You may have a sigmoidoscopy every 5 years or a colonoscopy every 10 years starting at age 450.  Hepatitis C blood test.  Hepatitis B blood test.  Sexually transmitted disease (STD) testing.  Diabetes screening. This is done by checking your blood sugar (glucose) after you have not eaten for a while (fasting). You may have this done every 1-3 years.  Bone density scan. This is done to screen for osteoporosis. You may have this done starting at age 76.  Mammogram. This may be done every 1-2 years. Talk to your health care provider about how often you should have regular mammograms. Talk with your health care provider about your test results, treatment options, and if necessary, the need for more tests. Vaccines  Your health care provider may recommend certain vaccines, such  as:  Influenza vaccine. This is recommended every year.  Tetanus, diphtheria, and acellular pertussis (Tdap, Td) vaccine. You may need a Td booster every 10 years.  Zoster vaccine. You may need this after age 18.  Pneumococcal 13-valent conjugate (PCV13) vaccine. One dose is recommended  after age 67.  Pneumococcal polysaccharide (PPSV23) vaccine. One dose is recommended after age 61. Talk to your health care provider about which screenings and vaccines you need and how often you need them. This information is not intended to replace advice given to you by your health care provider. Make sure you discuss any questions you have with your health care provider. Document Released: 11/07/2015 Document Revised: 06/30/2016 Document Reviewed: 08/12/2015 Elsevier Interactive Patient Education  2017 [place] Prevention in the Home Falls can cause injuries. They can happen to people of all ages. There are many things you can do to make your home safe and to help prevent falls. What can I do on the outside of my home?  Regularly fix the edges of walkways and driveways and fix any cracks.  Remove anything that might make you trip as you walk through a door, such as a raised step or threshold.  Trim any bushes or trees on the path to your home.  Use bright outdoor lighting.  Clear any walking paths of anything that might make someone trip, such as rocks or tools.  Regularly check to see if handrails are loose or broken. Make sure that both sides of any steps have handrails.  Any raised decks and porches should have guardrails on the edges.  Have any leaves, snow, or ice cleared regularly.  Use sand or salt on walking paths during winter.  Clean up any spills in your garage right away. This includes oil or grease spills. What can I do in the bathroom?  Use night lights.  Install grab bars by the toilet and in the tub and shower. Do not use towel bars as grab bars.  Use non-skid mats or decals in the tub or shower.  If you need to sit down in the shower, use a plastic, non-slip stool.  Keep the floor dry. Clean up any water that spills on the floor as soon as it happens.  Remove soap buildup in the tub or shower regularly.  Attach bath mats securely with  double-sided non-slip rug tape.  Do not have throw rugs and other things on the floor that can make you trip. What can I do in the bedroom?  Use night lights.  Make sure that you have a light by your bed that is easy to reach.  Do not use any sheets or blankets that are too big for your bed. They should not hang down onto the floor.  Have a firm chair that has side arms. You can use this for support while you get dressed.  Do not have throw rugs and other things on the floor that can make you trip. What can I do in the kitchen?  Clean up any spills right away.  Avoid walking on wet floors.  Keep items that you use a lot in easy-to-reach places.  If you need to reach something above you, use a strong step stool that has a grab bar.  Keep electrical cords out of the way.  Do not use floor polish or wax that makes floors slippery. If you must use wax, use non-skid floor wax.  Do not have throw rugs and other things on the  floor that can make you trip. What can I do with my stairs?  Do not leave any items on the stairs.  Make sure that there are handrails on both sides of the stairs and use them. Fix handrails that are broken or loose. Make sure that handrails are as long as the stairways.  Check any carpeting to make sure that it is firmly attached to the stairs. Fix any carpet that is loose or worn.  Avoid having throw rugs at the top or bottom of the stairs. If you do have throw rugs, attach them to the floor with carpet tape.  Make sure that you have a light switch at the top of the stairs and the bottom of the stairs. If you do not have them, ask someone to add them for you. What else can I do to help prevent falls?  Wear shoes that:  Do not have high heels.  Have rubber bottoms.  Are comfortable and fit you well.  Are closed at the toe. Do not wear sandals.  If you use a stepladder:  Make sure that it is fully opened. Do not climb a closed stepladder.  Make  sure that both sides of the stepladder are locked into place.  Ask someone to hold it for you, if possible.  Clearly mark and make sure that you can see:  Any grab bars or handrails.  First and last steps.  Where the edge of each step is.  Use tools that help you move around (mobility aids) if they are needed. These include:  Canes.  Walkers.  Scooters.  Crutches.  Turn on the lights when you go into a dark area. Replace any light bulbs as soon as they burn out.  Set up your furniture so you have a clear path. Avoid moving your furniture around.  If any of your floors are uneven, fix them.  If there are any pets around you, be aware of where they are.  Review your medicines with your doctor. Some medicines can make you feel dizzy. This can increase your chance of falling. Ask your doctor what other things that you can do to help prevent falls. This information is not intended to replace advice given to you by your health care provider. Make sure you discuss any questions you have with your health care provider. Document Released: 08/07/2009 Document Revised: 03/18/2016 Document Reviewed: 11/15/2014 Elsevier Interactive Patient Education  2017 [employer].

## 2018-11-01 ENCOUNTER — Telehealth: Payer: Self-pay | Admitting: Family Medicine

## 2018-11-01 NOTE — Telephone Encounter (Signed)
Is it okay to order?  Thanks,   -Laura  

## 2018-11-01 NOTE — Telephone Encounter (Signed)
Order written, please fax

## 2018-11-01 NOTE — Telephone Encounter (Signed)
Order faxed.

## 2018-11-01 NOTE — Telephone Encounter (Signed)
Pt is needing colostomy supplies.  Needing this faxed to [facility] - fax - [phone]  Thanks, [facility]

## 2018-12-25 ENCOUNTER — Encounter: Payer: Self-pay | Admitting: Physician Assistant

## 2018-12-25 ENCOUNTER — Ambulatory Visit: Payer: Medicare Other | Admitting: Physician Assistant

## 2018-12-25 VITALS — BP 114/72 | HR 90 | Temp 98.2°F | Resp 16 | Wt 133.0 lb

## 2018-12-25 DIAGNOSIS — B078 Other viral warts: Secondary | ICD-10-CM

## 2018-12-25 DIAGNOSIS — B079 Viral wart, unspecified: Secondary | ICD-10-CM

## 2018-12-25 NOTE — Progress Notes (Signed)
       Patient: Katherine Wood Female    DOB: 02-01-1941   78 y.o.   MRN: [id number] Visit Date: 12/25/2018  Today's Provider: Margaretann Loveless, PA-C   Chief Complaint  Patient presents with  . Rash   Subjective:    I, Sulibeya S. Dimas, CMA, am acting as a Neurosurgeon for [employer], PA-C.  HPI Patient here today c/o lesion on back of left heel, patient reports pain, and itching. Patient denies any discharge. Reports she just noticed it over the last week or two.   Allergies  Allergen Reactions  . Alendronate Sodium Other (See Comments)    Joint pains Joint pains  . Galantamine Other (See Comments)     Current Outpatient Medications:  .  Calcium Carbonate (CALCIUM 600 PO), Take 900 mg by mouth daily., Disp: , Rfl:  .  Cholecalciferol (VITAMIN D3) 5000 units CAPS, Take 5,000 Units by mouth daily., Disp: , Rfl:  .  meloxicam (MOBIC) 15 MG tablet, TAKE 1 TABLET(15 MG) BY MOUTH DAILY, Disp: 30 tablet, Rfl: 5 .  vitamin E 400 UNIT capsule, Take 1,000 Units by mouth daily., Disp: , Rfl:   Review of Systems  Constitutional: Negative.   HENT: Negative.   Respiratory: Negative.   Cardiovascular: Negative.   Gastrointestinal: Negative.   Skin: Positive for wound.  Neurological: Negative.     Social History   Tobacco Use  . Smoking status: Never Smoker  . Smokeless tobacco: Never Used  Substance Use Topics  . Alcohol use: No    Alcohol/week: 0.0 standard drinks      Objective:   BP 114/72 (BP Location: Left Arm, Patient Position: Sitting, Cuff Size: Normal)   Pulse 90   Temp 98.2 F (36.8 C) (Oral)   Resp 16   Wt 133 lb (60.3 kg)   BMI 20.83 kg/m  Vitals:   12/25/18 1828  BP: 114/72  Pulse: 90  Resp: 16  Temp: 98.2 F (36.8 C)  TempSrc: Oral  Weight: 133 lb (60.3 kg)     Physical Exam Vitals signs reviewed.  Constitutional:      Appearance: She is well-developed.  HENT:     Head: Normocephalic and atraumatic.  Neck:     Musculoskeletal:  Normal range of motion and neck supple.  Pulmonary:     Effort: Pulmonary effort is normal. No respiratory distress.  Skin:      Psychiatric:        Behavior: Behavior normal.        Thought Content: Thought content normal.        Judgment: Judgment normal.         Assessment & Plan    1. Verruca Cryotherapy performed on the lesion. Advised due to size she may require a second treatment. They both agree.  - Cryotherapy/destruct benign or premalignant lesion     Margaretann Loveless, PA-C  [facility]

## 2018-12-25 NOTE — Patient Instructions (Signed)
Warts    Warts are small growths on the skin. They are common, and they are caused by a virus. Warts can be found on many parts of the body. A person may have one wart or many warts. Most warts will go away on their own with time, but this could take many months to a few years. Treatments may be done if needed.  What are the causes?  Warts are caused by a type of virus that is called HPV.  · This virus can spread from person to person through touching.  · Warts can also spread to other parts of the body when a person scratches a wart and then scratches normal skin.  What increases the risk?  You are more likely to get warts if:  · You are 10-20 years old.  · You have a weak body defense system (immune system).  · You are Caucasian.  What are the signs or symptoms?  The main symptom of this condition is small growths on the skin. Warts may:  · Be round, oval, or have an uneven shape.  · Feel rough to the touch.  · Be the color of your skin or light yellow, brown, or gray.  · Often be less than ½ inch (1.3 cm) in size.  · Go away and then come back again.  Most warts do not hurt, but some can hurt if they are large or if they are on the bottom of your feet.  How is this diagnosed?  A wart can often be diagnosed by how it looks. In some cases, the doctor might remove a little bit of the wart to test it (biopsy).  How is this treated?  Most of the time, warts do not need treatment. Sometimes people want warts removed. If treatment is needed or wanted, options may include:  · Putting creams or patches with medicine in them on the wart.  · Putting duct tape over the top of the wart.  · Freezing the wart.  · Burning the wart with:  ? A laser.  ? An electric probe.  · Giving a shot of medicine into the wart to help the body's defense system fight off the wart.  · Surgery to remove the wart.  Follow these instructions at home:    Medicines  · Apply over-the-counter and prescription medicines only as told by your  doctor.  · Do not apply over-the-counter wart medicines to your face or genitals before you ask your doctor if it is okay to do that.  Lifestyle  · Keep your body's defense system healthy. To do this:  ? Eat a healthy diet.  ? Get enough sleep.  ? Do not use any products that contain nicotine or tobacco, such as cigarettes and e-cigarettes. If you need help quitting, ask your doctor.  General instructions  · Wash your hands after you touch a wart.  · Do not scratch or pick at a wart.  · Avoid shaving hair that is over a wart.  · Keep all follow-up visits as told by your doctor. This is important.  Contact a doctor if:  · Your warts do not get better after treatment.  · You have redness, swelling, or pain at the site of a wart.  · You have bleeding from a wart, and the bleeding does not stop when you put light pressure on the wart.  · You have diabetes and you get a wart.  Summary  · Warts are small   growths on the skin. They are common, and they are caused by a virus.  · Most of the time, warts do not need treatment. Sometimes people want warts removed. If treatment is needed or wanted, there are many options.  · Apply over-the-counter and prescription medicines only as told by your doctor.  · Wash your hands after you touch a wart.  · Keep all follow-up visits as told by your doctor. This is important.  This information is not intended to replace advice given to you by your health care provider. Make sure you discuss any questions you have with your health care provider.  Document Released: 02/11/2011 Document Revised: 02/28/2018 Document Reviewed: 02/28/2018  Elsevier Interactive Patient Education © 2019 [employer]

## 2018-12-29 ENCOUNTER — Other Ambulatory Visit: Payer: Self-pay | Admitting: Internal Medicine

## 2018-12-29 DIAGNOSIS — M810 Age-related osteoporosis without current pathological fracture: Secondary | ICD-10-CM

## 2019-01-05 ENCOUNTER — Other Ambulatory Visit: Payer: Self-pay | Admitting: Obstetrics and Gynecology

## 2019-02-20 ENCOUNTER — Other Ambulatory Visit: Payer: Medicare PPO

## 2019-04-11 ENCOUNTER — Other Ambulatory Visit: Payer: Self-pay

## 2019-04-11 ENCOUNTER — Ambulatory Visit
Admission: RE | Admit: 2019-04-11 | Discharge: 2019-04-11 | Disposition: A | Discharge status: Home / Self Care | Payer: Medicare Other | Source: Ambulatory Visit | Attending: Internal Medicine | Admitting: Internal Medicine

## 2019-04-11 DIAGNOSIS — M810 Age-related osteoporosis without current pathological fracture: Secondary | ICD-10-CM | POA: Diagnosis present

## 2019-05-11 ENCOUNTER — Ambulatory Visit: Payer: Medicare Other | Admitting: Family Medicine

## 2019-05-11 ENCOUNTER — Encounter: Payer: Self-pay | Admitting: Family Medicine

## 2019-05-11 ENCOUNTER — Other Ambulatory Visit: Payer: Self-pay

## 2019-05-11 VITALS — BP 112/68 | HR 77 | Temp 97.4°F | Wt 129.0 lb

## 2019-05-11 DIAGNOSIS — B351 Tinea unguium: Secondary | ICD-10-CM | POA: Diagnosis not present

## 2019-05-11 MED ORDER — TERBINAFINE HCL 250 MG PO TABS: 500.0000 mg | ORAL_TABLET | Freq: Every day | ORAL | 5 refills | Status: DC

## 2019-05-11 NOTE — Patient Instructions (Addendum)
.   Please review the attached list of medications and notify my office if there are any errors.   . Please bring all of your medications to every appointment so we can make sure that our medication list is the same as yours.   . We will have flu vaccines available after Labor Day. Please go to your pharmacy or call the office in early September to schedule you flu shot.   Soak your salt in warm salty water or Epsom salts for 10 minutes a day.   If the nail doesn't fall of within 2 weeks, or it start to cause pain, call for a referral to a podiatrist. I would recommend Dr. Elvina Mattes, Dr, Milinda Pointer or Dr. Paulla Dolly

## 2019-05-11 NOTE — Progress Notes (Signed)
       Patient: Katherine Wood Female    DOB: 09-Jan-1941   78 y.o.   MRN: [id number] Visit Date: 05/11/2019  Today's Provider: Lelon Huh, MD   Chief Complaint  Patient presents with  . Nail Problem   Subjective:     HPI   Pt presents today to have are right first toenail checked.  Pt reports treating it with OTC nail fungus liquid.  Last week her nail started to separate but has not come off yet.  She denies any pain, swelling, or fevers.   Allergies  Allergen Reactions  . Alendronate Sodium Other (See Comments)    Joint pains Joint pains  . Galantamine Other (See Comments)     Current Outpatient Medications:  .  Calcium Carbonate (CALCIUM 600 PO), Take 900 mg by mouth daily., Disp: , Rfl:  .  Cholecalciferol (VITAMIN D3) 5000 units CAPS, Take 5,000 Units by mouth daily., Disp: , Rfl:  .  meloxicam (MOBIC) 15 MG tablet, TAKE 1 TABLET(15 MG) BY MOUTH DAILY, Disp: 30 tablet, Rfl: 5 .  vitamin E 400 UNIT capsule, Take 1,000 Units by mouth daily., Disp: , Rfl:   Review of Systems  Constitutional: Negative.   Musculoskeletal: Negative.   Skin: Positive for color change. Negative for pallor, rash and wound.    Social History   Tobacco Use  . Smoking status: Never Smoker  . Smokeless tobacco: Never Used  Substance Use Topics  . Alcohol use: No    Alcohol/week: 0.0 standard drinks      Objective:   BP 112/68 (BP Location: Right Arm, Patient Position: Sitting, Cuff Size: Normal)   Pulse 77   Temp (!) 97.4 F (36.3 C) (Oral)   Wt 129 lb (58.5 kg)   BMI 20.20 kg/m  Vitals:   05/11/19 1438  BP: 112/68  Pulse: 77  Temp: (!) 97.4 F (36.3 C)  TempSrc: Oral  Weight: 129 lb (58.5 kg)    Physical Exam   Moderate onychomycosis of all nails, much worse right great toe is noted. Proximal edge of nail is detached, but is still attached on both sides in central nail bed. No drainage, no erythema.      Assessment & Plan    1. Onychomycosis With partial  nail separation. Will treated underlying fungal infection. Daily soaks. To call if any pain or swelling or if nail does not completely detach in two weeks.  - terbinafine (LAMISIL) 250 MG tablet; Take 2 tablets (500 mg total) by mouth daily. Take for 1 week out of every 4 weeks  Dispense: 14 tablet; Refill: 5  The entirety of the information documented in the History of Present Illness, Review of Systems and Physical Exam were personally obtained by me. Portions of this information were initially documented by Ashley Royalty, CMA and reviewed by me for thoroughness and accuracy.      Lelon Huh, MD  [facility]

## 2019-10-10 ENCOUNTER — Telehealth: Payer: Self-pay | Admitting: Family Medicine

## 2019-10-10 NOTE — Telephone Encounter (Signed)
Please advise if ok to send office notes.

## 2019-10-10 NOTE — Telephone Encounter (Signed)
From [facility] 

## 2019-10-10 NOTE — Telephone Encounter (Signed)
That's fine

## 2019-10-10 NOTE — Telephone Encounter (Signed)
[facility] calling for the last OV notes in order to fill the pt's colostomy supplies.  Last script sent in 01/2019. Pt got the supplies, but they never received the ov notes.  They they are back tracking to get insurance to pay.  cb [phone] Fax  [phone]

## 2019-10-11 NOTE — Telephone Encounter (Signed)
Will someone from medical records print and  Fax requested notes. Thanks.

## 2019-10-11 NOTE — Telephone Encounter (Signed)
Please advise which office note(s)/date to send.  Thanks, [employer]

## 2019-10-14 NOTE — Telephone Encounter (Signed)
Can send office note from 08/17/2016. If they need something more recent she will need to schedule an office visit.

## 2019-10-15 NOTE — Telephone Encounter (Signed)
Faxed OV note 08/17/2016 to [place]. Thanks TNP

## 2021-03-19 ENCOUNTER — Other Ambulatory Visit: Payer: Self-pay

## 2021-03-19 ENCOUNTER — Ambulatory Visit: Payer: Medicare PPO | Admitting: Nurse Practitioner

## 2021-03-19 ENCOUNTER — Encounter: Payer: Self-pay | Admitting: Nurse Practitioner

## 2021-03-19 VITALS — BP 109/73 | HR 114 | Temp 98.9°F | Ht 65.0 in | Wt 139.6 lb

## 2021-03-19 DIAGNOSIS — G309 Alzheimer's disease, unspecified: Secondary | ICD-10-CM | POA: Diagnosis not present

## 2021-03-19 DIAGNOSIS — F320 Major depressive disorder, single episode, mild: Secondary | ICD-10-CM

## 2021-03-19 DIAGNOSIS — F411 Generalized anxiety disorder: Secondary | ICD-10-CM | POA: Diagnosis not present

## 2021-03-19 DIAGNOSIS — F0280 Dementia in other diseases classified elsewhere without behavioral disturbance: Secondary | ICD-10-CM

## 2021-03-19 DIAGNOSIS — E559 Vitamin D deficiency, unspecified: Secondary | ICD-10-CM | POA: Diagnosis not present

## 2021-03-19 DIAGNOSIS — Z939 Artificial opening status, unspecified: Secondary | ICD-10-CM

## 2021-03-19 DIAGNOSIS — D508 Other iron deficiency anemias: Secondary | ICD-10-CM

## 2021-03-19 DIAGNOSIS — M25562 Pain in left knee: Secondary | ICD-10-CM

## 2021-03-19 NOTE — Assessment & Plan Note (Signed)
Chronic.  Controlled off medication.  Will continue to evaluate at future visits.  

## 2021-03-19 NOTE — Assessment & Plan Note (Signed)
Chronic.  Controlled off medication.  Will continue to evaluate at future visits.

## 2021-03-19 NOTE — Assessment & Plan Note (Signed)
Labs drawn today. Will make recommendations based on lab results.

## 2021-03-19 NOTE — Assessment & Plan Note (Signed)
Recommend establishing care with a GI in there area. Established in 2017.  No concerns since then.

## 2021-03-19 NOTE — Assessment & Plan Note (Signed)
Chronic.  Well controlled at this time. Continue with current regimen. Can consider seeing a neurologist in the future. Patient's son denies any behavioral concerns or sun downers.

## 2021-03-19 NOTE — Progress Notes (Signed)
BP 109/73   Pulse (!) 114   Temp 98.9 F (37.2 C) (Oral)   Ht '5\' 5"'  (1.651 m)   Wt 139 lb 9.6 oz (63.3 kg)   SpO2 99%   BMI 23.23 kg/m    Subjective:    Patient ID: Katherine Wood, female    DOB: 02/15/1941, 80 y.o.   MRN: [id number]  HPI: Katherine Wood is a 80 y.o. female  Chief Complaint  Patient presents with  . Establish Care    Patient son states patient is here to establish care, but also to discuss the pain in patient left knee. Patient son states it all started yesterday. Denies patient falling, stating she was walking and getting around normal days before yesterday.    Patient presents to clinic to establish care with new PCP.  Patient reports a history of Dementia, Anxiety, Depression Vitamin D Deficiency, bowel resection with an ostomy and anemia.  Patient just moved back to [place] recently after being in [place] for a couple of years.  Her husband recently passed away and now she will be living with her son and daughter.   Patient denies a history of: Hypertension, Elevated Cholesterol, Thyroid problems, Depression, Anxiety, Neurological problems, and Abdominal problems.   KNEE PAIN Patient's son states that her left knee started hurting yesterday.  Patient states she doesn't know when it hurts more.  Son says that it hurts more when she is walking or going up stairs.  Denies injury or a fall.  Denies having a lot of pain just a little bothersome.   Denies HA, CP, SOB, dizziness, palpitations, visual changes, and lower extremity swelling.  Relevant past medical, surgical, family and social history reviewed and updated as indicated. Interim medical history since our last visit reviewed. Allergies and medications reviewed and updated.  Review of Systems  Eyes: Negative for visual disturbance.  Respiratory: Negative for cough, chest tightness and shortness of breath.   Cardiovascular: Negative for chest pain, palpitations and leg swelling.   Musculoskeletal:       Left knee pain  Neurological: Negative for dizziness and headaches.    Per HPI unless specifically indicated above     Objective:    BP 109/73   Pulse (!) 114   Temp 98.9 F (37.2 C) (Oral)   Ht '5\' 5"'  (1.651 m)   Wt 139 lb 9.6 oz (63.3 kg)   SpO2 99%   BMI 23.23 kg/m   Wt Readings from Last 3 Encounters:  03/19/21 139 lb 9.6 oz (63.3 kg)  05/11/19 129 lb (58.5 kg)  12/25/18 133 lb (60.3 kg)    Physical Exam Vitals and nursing note reviewed.  Constitutional:      General: She is not in acute distress.    Appearance: Normal appearance. She is normal weight. She is not ill-appearing, toxic-appearing or diaphoretic.  HENT:     Head: Normocephalic.     Right Ear: External ear normal.     Left Ear: External ear normal.     Nose: Nose normal.     Mouth/Throat:     Mouth: Mucous membranes are moist.     Pharynx: Oropharynx is clear.  Eyes:     General:        Right eye: No discharge.        Left eye: No discharge.     Extraocular Movements: Extraocular movements intact.     Conjunctiva/sclera: Conjunctivae normal.     Pupils: Pupils are equal, round, and reactive  to light.  Cardiovascular:     Rate and Rhythm: Normal rate and regular rhythm.     Heart sounds: No murmur heard.   Pulmonary:     Effort: Pulmonary effort is normal. No respiratory distress.     Breath sounds: Normal breath sounds. No wheezing or rales.  Musculoskeletal:     Cervical back: Normal range of motion and neck supple.     Right knee: Normal.     Left knee: Swelling present. Normal range of motion. No tenderness.  Skin:    General: Skin is warm and dry.     Capillary Refill: Capillary refill takes less than 2 seconds.  Neurological:     General: No focal deficit present.     Mental Status: She is alert and oriented to person, place, and time. Mental status is at baseline.  Psychiatric:        Mood and Affect: Mood normal.        Behavior: Behavior normal.         Thought Content: Thought content normal.        Judgment: Judgment normal.     Results for orders placed or performed in visit on 05/23/17  Cologuard  Result Value Ref Range   Cologuard        Assessment & Plan:   Problem List Items Addressed This Visit      Nervous and Auditory   Alzheimer's dementia ([place]) - Primary    Chronic.  Well controlled at this time. Continue with current regimen. Can consider seeing a neurologist in the future. Patient's son denies any behavioral concerns or sun downers.      Relevant Orders   Comp Met (CMET)     Other   Anxiety state    Chronic.  Controlled off medication.  Will continue to evaluate at future visits.       Vitamin D deficiency    Labs drawn today.  Will make recommendations based on lab results.       Relevant Orders   Comp Met (CMET)   Vitamin D (25 hydroxy)   Anemia    Labs drawn today. Will make recommendations based on lab results.      Relevant Orders   Anemia Profile B   History of creation of ostomy ([place])    Recommend establishing care with a GI in there area. Established in 2017.  No concerns since then.      Relevant Orders   Ambulatory referral to Gastroenterology   Mild single current episode of major depressive disorder ([place])    Chronic.  Controlled off medication.  Will continue to evaluate at future visits.       Relevant Orders   Comp Met (CMET)    Other Visit Diagnoses    Acute pain of left knee       Recommend obtaining xray of right knee. Will make recommendations based on imaging results.   Relevant Orders   DG Knee Complete 4 Views Left       Follow up plan: Return in about 3 months (around 06/19/2021) for anemia and dementia.    A total of 40 minutes were spent on this encounter today.  When total time is documented, this includes both the face-to-face and non-face-to-face time personally spent before, during and after the visit on the date of the encounter.

## 2021-03-19 NOTE — Assessment & Plan Note (Signed)
Labs drawn today. Will make recommendations based on lab results. 

## 2021-03-20 ENCOUNTER — Encounter: Payer: Self-pay | Admitting: *Deleted

## 2021-03-20 LAB — COMPREHENSIVE METABOLIC PANEL
ALT: 9 IU/L (ref 0–32)
AST: 16 IU/L (ref 0–40)
Albumin/Globulin Ratio: 1.2 (ref 1.2–2.2)
Albumin: 4 g/dL (ref 3.7–4.7)
Alkaline Phosphatase: 86 IU/L (ref 44–121)
BUN/Creatinine Ratio: 19 (ref 12–28)
BUN: 18 mg/dL (ref 8–27)
Bilirubin Total: 0.8 mg/dL (ref 0.0–1.2)
CO2: 18 mmol/L — ABNORMAL LOW (ref 20–29)
Calcium: 9.2 mg/dL (ref 8.7–10.3)
Chloride: 100 mmol/L (ref 96–106)
Creatinine, Ser: 0.96 mg/dL (ref 0.57–1.00)
Globulin, Total: 3.4 g/dL (ref 1.5–4.5)
Glucose: 80 mg/dL (ref 65–99)
Potassium: 3.9 mmol/L (ref 3.5–5.2)
Sodium: 140 mmol/L (ref 134–144)
Total Protein: 7.4 g/dL (ref 6.0–8.5)
eGFR: 61 mL/min/{1.73_m2} (ref >59)

## 2021-03-20 LAB — ANEMIA PROFILE B
Basophils Absolute: 0.1 10*3/uL (ref 0.0–0.2)
Basos: 1 %
EOS (ABSOLUTE): 0 10*3/uL (ref 0.0–0.4)
Eos: 0 %
Ferritin: 1356 ng/mL — ABNORMAL HIGH (ref 15–150)
Folate: >20 ng/mL (ref >3.0)
Hematocrit: 36.4 % (ref 34.0–46.6)
Hemoglobin: 12 g/dL (ref 11.1–15.9)
Immature Grans (Abs): 0.1 10*3/uL (ref 0.0–0.1)
Immature Granulocytes: 1 %
Iron Saturation: 7 % — CL (ref 15–55)
Iron: 17 ug/dL — ABNORMAL LOW (ref 27–139)
Lymphocytes Absolute: 1 10*3/uL (ref 0.7–3.1)
Lymphs: 10 %
MCH: 33.2 pg — ABNORMAL HIGH (ref 26.6–33.0)
MCHC: 33 g/dL (ref 31.5–35.7)
MCV: 101 fL — ABNORMAL HIGH (ref 79–97)
Monocytes Absolute: 0.8 10*3/uL (ref 0.1–0.9)
Monocytes: 9 %
Neutrophils Absolute: 7.6 10*3/uL — ABNORMAL HIGH (ref 1.4–7.0)
Neutrophils: 79 %
Platelets: 274 10*3/uL (ref 150–450)
RBC: 3.61 x10E6/uL — ABNORMAL LOW (ref 3.77–5.28)
RDW: 13.1 % (ref 11.7–15.4)
Retic Ct Pct: 1.2 % (ref 0.6–2.6)
Total Iron Binding Capacity: 244 ug/dL — ABNORMAL LOW (ref 250–450)
UIBC: 227 ug/dL (ref 118–369)
Vitamin B-12: 912 pg/mL (ref 232–1245)
WBC: 9.5 10*3/uL (ref 3.4–10.8)

## 2021-03-20 LAB — VITAMIN D 25 HYDROXY (VIT D DEFICIENCY, FRACTURES): Vit D, 25-Hydroxy: 36.1 ng/mL (ref 30.0–100.0)

## 2021-03-20 NOTE — Progress Notes (Signed)
Please let patient's son know that she has iron deficiency.  She needs to start Ferrous Sulfate 65mg  daily.  This can cause constipation and GI upset. We will recheck this at her follow up.  Otherwise her lab work looks good. Vitamin D is within normal limits.

## 2021-05-20 DIAGNOSIS — M545 Low back pain, unspecified: Secondary | ICD-10-CM

## 2021-05-20 DIAGNOSIS — G8929 Other chronic pain: Secondary | ICD-10-CM

## 2021-06-19 ENCOUNTER — Ambulatory Visit (INDEPENDENT_AMBULATORY_CARE_PROVIDER_SITE_OTHER): Payer: Medicare PPO | Admitting: Nurse Practitioner

## 2021-06-19 ENCOUNTER — Other Ambulatory Visit: Payer: Self-pay

## 2021-06-19 ENCOUNTER — Encounter: Payer: Self-pay | Admitting: Nurse Practitioner

## 2021-06-19 VITALS — BP 140/98 | HR 102 | Temp 99.2°F | Ht 62.99 in | Wt 139.0 lb

## 2021-06-19 DIAGNOSIS — F411 Generalized anxiety disorder: Secondary | ICD-10-CM

## 2021-06-19 DIAGNOSIS — D508 Other iron deficiency anemias: Secondary | ICD-10-CM

## 2021-06-19 DIAGNOSIS — R413 Other amnesia: Secondary | ICD-10-CM

## 2021-06-19 DIAGNOSIS — Z1159 Encounter for screening for other viral diseases: Secondary | ICD-10-CM

## 2021-06-19 MED ORDER — SERTRALINE HCL 25 MG PO TABS: 25.0000 mg | ORAL_TABLET | Freq: Every day | ORAL | 0 refills | Status: DC

## 2021-06-19 NOTE — Assessment & Plan Note (Signed)
Chronic. Uncontrolled.  Will start Zoloft 25mg  daily.  Side effects and benefits of medication with patient and son today. Will follow up in 1 month for reevaluation.

## 2021-06-19 NOTE — Progress Notes (Signed)
BP (!) 140/98   Pulse (!) 102   Temp 99.2 F (37.3 C)   Ht 5' 2.99" (1.6 m)   Wt 139 lb (63 kg)   SpO2 92%   BMI 24.63 kg/m    Subjective:    Patient ID: Katherine Wood, female    DOB: 30-Jul-1941, 80 y.o.   MRN: [id number]  HPI: SHAWNTEL FARNWORTH is a 80 y.o. female  Chief Complaint  Patient presents with   Anemia   Dementia   ANEMIA Anemia status: controlled Etiology of anemia: Duration of anemia treatment:  Compliance with treatment: excellent compliance Iron supplementation side effects: no Severity of anemia: severe Fatigue: no Decreased exercise tolerance: no  Dyspnea on exertion: no Palpitations: no Bleeding: no Pica: no  MEMORY Patient's son says she wakes in the morning and is very confused and anxious.  She forgets where she is at and sometimes forgets who she is.  After the first hour she is totally fine and oriented.  They would like to start medication to help with anxiety.   GAD 7 : Generalized Anxiety Score 06/19/2021  Nervous, Anxious, on Edge 3  Control/stop worrying 3  Worry too much - different things 3  Trouble relaxing 3  Restless 3  Easily annoyed or irritable 3  Afraid - awful might happen 3  Total GAD 7 Score 21  Anxiety Difficulty Extremely difficult    Flowsheet Row Office Visit from 06/19/2021 in [facility]  PHQ-9 Total Score 0       Relevant past medical, surgical, family and social history reviewed and updated as indicated. Interim medical history since our last visit reviewed. Allergies and medications reviewed and updated.  Review of Systems  Constitutional:  Negative for fatigue.  Respiratory:  Negative for shortness of breath.   Cardiovascular:  Negative for palpitations.  Gastrointestinal:  Negative for blood in stool.  Genitourinary:  Negative for hematuria.  Psychiatric/Behavioral:  The patient is nervous/anxious.    Per HPI unless specifically indicated above     Objective:    BP (!)  140/98   Pulse (!) 102   Temp 99.2 F (37.3 C)   Ht 5' 2.99" (1.6 m)   Wt 139 lb (63 kg)   SpO2 92%   BMI 24.63 kg/m   Wt Readings from Last 3 Encounters:  06/19/21 139 lb (63 kg)  03/19/21 139 lb 9.6 oz (63.3 kg)  05/11/19 129 lb (58.5 kg)    Physical Exam Vitals and nursing note reviewed.  Constitutional:      General: She is not in acute distress.    Appearance: Normal appearance. She is normal weight. She is not ill-appearing, toxic-appearing or diaphoretic.  HENT:     Head: Normocephalic.     Right Ear: External ear normal.     Left Ear: External ear normal.     Nose: Nose normal.     Mouth/Throat:     Mouth: Mucous membranes are moist.     Pharynx: Oropharynx is clear.  Eyes:     General:        Right eye: No discharge.        Left eye: No discharge.     Extraocular Movements: Extraocular movements intact.     Conjunctiva/sclera: Conjunctivae normal.     Pupils: Pupils are equal, round, and reactive to light.  Cardiovascular:     Rate and Rhythm: Normal rate and regular rhythm.     Heart sounds: No murmur heard. Pulmonary:  Effort: Pulmonary effort is normal. No respiratory distress.     Breath sounds: Normal breath sounds. No wheezing or rales.  Musculoskeletal:     Cervical back: Normal range of motion and neck supple.  Skin:    General: Skin is warm and dry.     Capillary Refill: Capillary refill takes less than 2 seconds.  Neurological:     General: No focal deficit present.     Mental Status: She is alert and oriented to person, place, and time. Mental status is at baseline.  Psychiatric:        Mood and Affect: Mood normal.        Behavior: Behavior normal.        Thought Content: Thought content normal.        Judgment: Judgment normal.    Results for orders placed or performed in visit on 03/19/21  Anemia Profile B  Result Value Ref Range   Total Iron Binding Capacity 244 (L) 250 - 450 ug/dL   UIBC 227 118 - 369 ug/dL   Iron 17 (L) 27 - 139  ug/dL   Iron Saturation 7 (LL) 15 - 55 %   Ferritin 1,356 (H) 15 - 150 ng/mL   Vitamin B-12 912 232 - 1,245 pg/mL   Folate >20.0 >3.0 ng/mL   WBC 9.5 3.4 - 10.8 x10E3/uL   RBC 3.61 (L) 3.77 - 5.28 x10E6/uL   Hemoglobin 12.0 11.1 - 15.9 g/dL   Hematocrit 36.4 34.0 - 46.6 %   MCV 101 (H) 79 - 97 fL   MCH 33.2 (H) 26.6 - 33.0 pg   MCHC 33.0 31.5 - 35.7 g/dL   RDW 13.1 11.7 - 15.4 %   Platelets 274 150 - 450 x10E3/uL   Neutrophils 79 Not Estab. %   Lymphs 10 Not Estab. %   Monocytes 9 Not Estab. %   Eos 0 Not Estab. %   Basos 1 Not Estab. %   Neutrophils Absolute 7.6 (H) 1.4 - 7.0 x10E3/uL   Lymphocytes Absolute 1.0 0.7 - 3.1 x10E3/uL   Monocytes Absolute 0.8 0.1 - 0.9 x10E3/uL   EOS (ABSOLUTE) 0.0 0.0 - 0.4 x10E3/uL   Basophils Absolute 0.1 0.0 - 0.2 x10E3/uL   Immature Granulocytes 1 Not Estab. %   Immature Grans (Abs) 0.1 0.0 - 0.1 x10E3/uL   Retic Ct Pct 1.2 0.6 - 2.6 %  Comp Met (CMET)  Result Value Ref Range   Glucose 80 65 - 99 mg/dL   BUN 18 8 - 27 mg/dL   Creatinine, Ser 0.96 0.57 - 1.00 mg/dL   eGFR 61 >59 mL/min/1.73   BUN/Creatinine Ratio 19 12 - 28   Sodium 140 134 - 144 mmol/L   Potassium 3.9 3.5 - 5.2 mmol/L   Chloride 100 96 - 106 mmol/L   CO2 18 (L) 20 - 29 mmol/L   Calcium 9.2 8.7 - 10.3 mg/dL   Total Protein 7.4 6.0 - 8.5 g/dL   Albumin 4.0 3.7 - 4.7 g/dL   Globulin, Total 3.4 1.5 - 4.5 g/dL   Albumin/Globulin Ratio 1.2 1.2 - 2.2   Bilirubin Total 0.8 0.0 - 1.2 mg/dL   Alkaline Phosphatase 86 44 - 121 IU/L   AST 16 0 - 40 IU/L   ALT 9 0 - 32 IU/L  Vitamin D (25 hydroxy)  Result Value Ref Range   Vit D, 25-Hydroxy 36.1 30.0 - 100.0 ng/mL      Assessment & Plan:   Problem List Items Addressed This  Visit       Other   Anxiety state    Chronic. Uncontrolled.  Will start Zoloft 62m daily.  Side effects and benefits of medication with patient and son today. Will follow up in 1 month for reevaluation.       Relevant Medications   sertraline  (ZOLOFT) 25 MG tablet   Memory loss - Primary   Anemia    Chronic.  Patient started Ferrous Sulfate at last visit.  Will recheck CBC today to evaluate anemia.        Relevant Orders   CBC w/Diff   Other Visit Diagnoses     Encounter for hepatitis C screening test for low risk patient       Relevant Orders   Hepatitis C antibody        Follow up plan: Return in about 1 month (around 07/20/2021) for Mood.

## 2021-06-19 NOTE — Assessment & Plan Note (Signed)
Chronic.  Patient started Ferrous Sulfate at last visit.  Will recheck CBC today to evaluate anemia.

## 2021-06-20 LAB — CBC WITH DIFFERENTIAL/PLATELET
Basophils Absolute: 0.1 10*3/uL (ref 0.0–0.2)
Basos: 1 %
EOS (ABSOLUTE): 0 10*3/uL (ref 0.0–0.4)
Eos: 1 %
Hematocrit: 35 % (ref 34.0–46.6)
Hemoglobin: 11.6 g/dL (ref 11.1–15.9)
Immature Grans (Abs): 0.1 10*3/uL (ref 0.0–0.1)
Immature Granulocytes: 1 %
Lymphocytes Absolute: 0.8 10*3/uL (ref 0.7–3.1)
Lymphs: 15 %
MCH: 33.1 pg — ABNORMAL HIGH (ref 26.6–33.0)
MCHC: 33.1 g/dL (ref 31.5–35.7)
MCV: 100 fL — ABNORMAL HIGH (ref 79–97)
Monocytes Absolute: 0.5 10*3/uL (ref 0.1–0.9)
Monocytes: 9 %
Neutrophils Absolute: 4.1 10*3/uL (ref 1.4–7.0)
Neutrophils: 73 %
Platelets: 316 10*3/uL (ref 150–450)
RBC: 3.5 x10E6/uL — ABNORMAL LOW (ref 3.77–5.28)
RDW: 13.6 % (ref 11.7–15.4)
WBC: 5.6 10*3/uL (ref 3.4–10.8)

## 2021-06-20 LAB — HEPATITIS C ANTIBODY: Hep C Virus Ab: <0.1 s/co ratio (ref 0.0–0.9)

## 2021-06-22 NOTE — Progress Notes (Signed)
Hi Rose.  Your lab work is stable.  We will continue to monitor in the future.  Please let me know if you have any questions.

## 2021-07-13 ENCOUNTER — Telehealth: Payer: Self-pay | Admitting: Nurse Practitioner

## 2021-07-13 NOTE — Telephone Encounter (Signed)
LVM for pt to rtn my call to schedule AWV with [facility]. Please schedule this appt if pt calls the office.  Thanks  

## 2021-07-15 ENCOUNTER — Telehealth: Payer: Self-pay

## 2021-07-15 NOTE — Telephone Encounter (Signed)
Left patient a VM requesting she call the office back to schedule her Medicare AWV

## 2021-07-19 ENCOUNTER — Ambulatory Visit: Payer: Medicare PPO

## 2021-07-19 ENCOUNTER — Telehealth: Payer: Self-pay

## 2021-07-19 NOTE — Telephone Encounter (Signed)
Contacted the patient to do her MWV- I left her a VM with phone number to the office requesting she call back to schedule

## 2021-07-20 ENCOUNTER — Ambulatory Visit: Payer: Medicare PPO | Admitting: Nurse Practitioner

## 2021-07-20 NOTE — Progress Notes (Deleted)
   There were no vitals taken for this visit.   Subjective:    Patient ID: Katherine Wood, female    DOB: 05/06/1941, 80 y.o.   MRN: [id number]  HPI: Katherine Wood is a 80 y.o. female  No chief complaint on file.  DEPRESSION/ANXIETY   Relevant past medical, surgical, family and social history reviewed and updated as indicated. Interim medical history since our last visit reviewed. Allergies and medications reviewed and updated.  Review of Systems  Per HPI unless specifically indicated above     Objective:    There were no vitals taken for this visit.  Wt Readings from Last 3 Encounters:  06/19/21 139 lb (63 kg)  03/19/21 139 lb 9.6 oz (63.3 kg)  05/11/19 129 lb (58.5 kg)    Physical Exam  Results for orders placed or performed in visit on 06/19/21  Hepatitis C antibody  Result Value Ref Range   Hep C Virus Ab <0.1 0.0 - 0.9 s/co ratio  CBC w/Diff  Result Value Ref Range   WBC 5.6 3.4 - 10.8 x10E3/uL   RBC 3.50 (L) 3.77 - 5.28 x10E6/uL   Hemoglobin 11.6 11.1 - 15.9 g/dL   Hematocrit 13.0 86.5 - 46.6 %   MCV 100 (H) 79 - 97 fL   MCH 33.1 (H) 26.6 - 33.0 pg   MCHC 33.1 31.5 - 35.7 g/dL   RDW 78.4 69.6 - 29.5 %   Platelets 316 150 - 450 x10E3/uL   Neutrophils 73 Not Estab. %   Lymphs 15 Not Estab. %   Monocytes 9 Not Estab. %   Eos 1 Not Estab. %   Basos 1 Not Estab. %   Neutrophils Absolute 4.1 1.4 - 7.0 x10E3/uL   Lymphocytes Absolute 0.8 0.7 - 3.1 x10E3/uL   Monocytes Absolute 0.5 0.1 - 0.9 x10E3/uL   EOS (ABSOLUTE) 0.0 0.0 - 0.4 x10E3/uL   Basophils Absolute 0.1 0.0 - 0.2 x10E3/uL   Immature Granulocytes 1 Not Estab. %   Immature Grans (Abs) 0.1 0.0 - 0.1 x10E3/uL      Assessment & Plan:   Problem List Items Addressed This Visit       Other   Anemia - Primary   Mild single current episode of major depressive disorder ([facility])     Follow up plan: No follow-ups on file.

## 2021-07-23 NOTE — Telephone Encounter (Signed)
Attempted to reach patient to reschedule Medicare AWV-mailbox was full could not leave a message

## 2021-07-31 ENCOUNTER — Other Ambulatory Visit: Payer: Self-pay

## 2021-07-31 MED ORDER — SERTRALINE HCL 25 MG PO TABS: 25.0000 mg | ORAL_TABLET | Freq: Every day | ORAL | 0 refills | Status: DC

## 2021-08-03 ENCOUNTER — Telehealth: Payer: Self-pay | Admitting: Nurse Practitioner

## 2021-08-03 ENCOUNTER — Other Ambulatory Visit: Payer: Self-pay

## 2021-08-03 NOTE — Telephone Encounter (Signed)
Left message for patient daughter to give our office a call back regarding rescheduling patient's missed appointment per Clydie Braun.

## 2021-08-03 NOTE — Telephone Encounter (Signed)
Please let daughter know that appointment was to follow up in the Zoloft that was started at last visit for patient's anxiety.  Patient missed the appointment.  Recommend they make an appt so we can discuss their concerns.

## 2021-08-03 NOTE — Telephone Encounter (Signed)
Daughter [name] would like Clydie Braun to give her a call to discuss pt's appointment, and what is really necessary to do.  Family is not going to take pt to get xrays of her knee. They said she has been getting her up and moving, which has made it much better.  Anytime is OK.

## 2021-08-04 NOTE — Telephone Encounter (Signed)
Pt scheduled for 10/14.

## 2021-08-06 ENCOUNTER — Ambulatory Visit: Payer: Medicare PPO | Admitting: Nurse Practitioner

## 2021-08-06 NOTE — Progress Notes (Deleted)
   There were no vitals taken for this visit.   Subjective:    Patient ID: Rueben Bash, female    DOB: 02/25/1941, 80 y.o.   MRN: [id number]  HPI: ILEAN SPRADLIN is a 79 y.o. female  No chief complaint on file.  MEMORY  DEPRESSION/ANXIETY  Relevant past medical, surgical, family and social history reviewed and updated as indicated. Interim medical history since our last visit reviewed. Allergies and medications reviewed and updated.  Review of Systems  Per HPI unless specifically indicated above     Objective:    There were no vitals taken for this visit.  Wt Readings from Last 3 Encounters:  06/19/21 139 lb (63 kg)  03/19/21 139 lb 9.6 oz (63.3 kg)  05/11/19 129 lb (58.5 kg)    Physical Exam  Results for orders placed or performed in visit on 06/19/21  Hepatitis C antibody  Result Value Ref Range   Hep C Virus Ab <0.1 0.0 - 0.9 s/co ratio  CBC w/Diff  Result Value Ref Range   WBC 5.6 3.4 - 10.8 x10E3/uL   RBC 3.50 (L) 3.77 - 5.28 x10E6/uL   Hemoglobin 11.6 11.1 - 15.9 g/dL   Hematocrit 65.9 93.5 - 46.6 %   MCV 100 (H) 79 - 97 fL   MCH 33.1 (H) 26.6 - 33.0 pg   MCHC 33.1 31.5 - 35.7 g/dL   RDW 70.1 77.9 - 39.0 %   Platelets 316 150 - 450 x10E3/uL   Neutrophils 73 Not Estab. %   Lymphs 15 Not Estab. %   Monocytes 9 Not Estab. %   Eos 1 Not Estab. %   Basos 1 Not Estab. %   Neutrophils Absolute 4.1 1.4 - 7.0 x10E3/uL   Lymphocytes Absolute 0.8 0.7 - 3.1 x10E3/uL   Monocytes Absolute 0.5 0.1 - 0.9 x10E3/uL   EOS (ABSOLUTE) 0.0 0.0 - 0.4 x10E3/uL   Basophils Absolute 0.1 0.0 - 0.2 x10E3/uL   Immature Granulocytes 1 Not Estab. %   Immature Grans (Abs) 0.1 0.0 - 0.1 x10E3/uL      Assessment & Plan:   Problem List Items Addressed This Visit       Nervous and Auditory   Alzheimer's dementia (HCC) - Primary     Other   Anxiety state   Mild single current episode of major depressive disorder (HCC)     Follow up plan: No follow-ups on  file.

## 2021-08-31 ENCOUNTER — Other Ambulatory Visit: Payer: Self-pay

## 2021-08-31 NOTE — Telephone Encounter (Signed)
Patient last seen 06/19/21

## 2021-09-02 NOTE — Progress Notes (Addendum)
BP (!) 122/55   Pulse 79   Temp (!) 97.2 F (36.2 C) (Oral)   Ht 5\' 3"  (1.6 m)   Wt 132 lb (59.9 kg)   SpO2 97%   BMI 23.38 kg/m    Subjective:    Patient ID: , female    DOB: 1941/03/10, 80 y.o.   MRN: [id number]  HPI: Katherine Wood is a 80 y.o. female  Chief Complaint  Patient presents with   Dementia   Depression   MEMORY She still wakes up and does not know who she is.  She does not know where she is.  However, once she takes the Zoloft it improves her agitation.   MOOD Patient's son states that they started the Zoloft and it has helped her calm down.  States she is crying less during the day.    Denies other concerns at visit today.   Relevant past medical, surgical, family and social history reviewed and updated as indicated. Interim medical history since our last visit reviewed. Allergies and medications reviewed and updated.  Review of Systems  Psychiatric/Behavioral:  Positive for behavioral problems and confusion. Negative for dysphoric mood. The patient is not nervous/anxious.    Per HPI unless specifically indicated above     Objective:    BP (!) 122/55   Pulse 79   Temp (!) 97.2 F (36.2 C) (Oral)   Ht 5\' 3"  (1.6 m)   Wt 132 lb (59.9 kg)   SpO2 97%   BMI 23.38 kg/m   Wt Readings from Last 3 Encounters:  09/03/21 132 lb (59.9 kg)  06/19/21 139 lb (63 kg)  03/19/21 139 lb 9.6 oz (63.3 kg)    Physical Exam Vitals and nursing note reviewed.  Constitutional:      General: She is not in acute distress.    Appearance: Normal appearance. She is normal weight. She is not ill-appearing, toxic-appearing or diaphoretic.  HENT:     Head: Normocephalic.     Right Ear: External ear normal.     Left Ear: External ear normal.     Nose: Nose normal.     Mouth/Throat:     Mouth: Mucous membranes are moist.     Pharynx: Oropharynx is clear.  Eyes:     General:        Right eye: No discharge.        Left eye: No discharge.      Extraocular Movements: Extraocular movements intact.     Conjunctiva/sclera: Conjunctivae normal.     Pupils: Pupils are equal, round, and reactive to light.  Cardiovascular:     Rate and Rhythm: Normal rate and regular rhythm.     Heart sounds: No murmur heard. Pulmonary:     Effort: Pulmonary effort is normal. No respiratory distress.     Breath sounds: Normal breath sounds. No wheezing or rales.  Musculoskeletal:     Cervical back: Normal range of motion and neck supple.  Skin:    General: Skin is warm and dry.     Capillary Refill: Capillary refill takes less than 2 seconds.  Neurological:     General: No focal deficit present.     Mental Status: She is alert and oriented to person, place, and time. Mental status is at baseline.  Psychiatric:        Mood and Affect: Mood normal.        Behavior: Behavior normal.        Thought Content: Thought  content normal.        Judgment: Judgment normal.    Results for orders placed or performed in visit on 06/19/21  Hepatitis C antibody  Result Value Ref Range   Hep C Virus Ab <0.1 0.0 - 0.9 s/co ratio  CBC w/Diff  Result Value Ref Range   WBC 5.6 3.4 - 10.8 x10E3/uL   RBC 3.50 (L) 3.77 - 5.28 x10E6/uL   Hemoglobin 11.6 11.1 - 15.9 g/dL   Hematocrit 35.0 34.0 - 46.6 %   MCV 100 (H) 79 - 97 fL   MCH 33.1 (H) 26.6 - 33.0 pg   MCHC 33.1 31.5 - 35.7 g/dL   RDW 13.6 11.7 - 15.4 %   Platelets 316 150 - 450 x10E3/uL   Neutrophils 73 Not Estab. %   Lymphs 15 Not Estab. %   Monocytes 9 Not Estab. %   Eos 1 Not Estab. %   Basos 1 Not Estab. %   Neutrophils Absolute 4.1 1.4 - 7.0 x10E3/uL   Lymphocytes Absolute 0.8 0.7 - 3.1 x10E3/uL   Monocytes Absolute 0.5 0.1 - 0.9 x10E3/uL   EOS (ABSOLUTE) 0.0 0.0 - 0.4 x10E3/uL   Basophils Absolute 0.1 0.0 - 0.2 x10E3/uL   Immature Granulocytes 1 Not Estab. %   Immature Grans (Abs) 0.1 0.0 - 0.1 x10E3/uL      Assessment & Plan:   Problem List Items Addressed This Visit       Nervous and  Auditory   Alzheimer's dementia (Katherine Wood) - Primary    Chronic.  Ongoing.  Still has confusion.  Will add risperadone 1mg  to patient's regimen at bedtime to help with patient's confusion and agitation in the morning. Discussed side effects and benefits of medications during visit.  Follow up in 3 months for reevaluation. Referral placed for CCM to give son resources in the community to help care for his mom.      Relevant Medications   risperiDONE (RISPERDAL) 1 MG tablet   sertraline (ZOLOFT) 25 MG tablet   Other Relevant Orders   AMB Referral to [place]     Other   Anxiety state    Chronic. Improved with Zoloft during the day.  Will continue with Zoloft daily.  Refills sent today. Follow up in 3 months.       Relevant Medications   sertraline (ZOLOFT) 25 MG tablet   Mild single current episode of major depressive disorder (HCC)    Chronic. Improved with Zoloft.  Will refill today. Follow up in 3 months for reevaluation.       Relevant Medications   sertraline (ZOLOFT) 25 MG tablet   Other Visit Diagnoses     Need for influenza vaccination       Relevant Orders   Flu Vaccine QUAD High Dose(Fluad) (Completed)        Follow up plan: Return in about 3 months (around 12/04/2021) for Depression/Anxiety FU.

## 2021-09-03 ENCOUNTER — Ambulatory Visit: Payer: Medicare PPO | Admitting: Nurse Practitioner

## 2021-09-03 ENCOUNTER — Other Ambulatory Visit: Payer: Self-pay

## 2021-09-03 ENCOUNTER — Encounter: Payer: Self-pay | Admitting: Nurse Practitioner

## 2021-09-03 VITALS — BP 122/55 | HR 79 | Temp 97.2°F | Ht 63.0 in | Wt 132.0 lb

## 2021-09-03 DIAGNOSIS — F411 Generalized anxiety disorder: Secondary | ICD-10-CM

## 2021-09-03 DIAGNOSIS — Z23 Encounter for immunization: Secondary | ICD-10-CM | POA: Diagnosis not present

## 2021-09-03 DIAGNOSIS — G309 Alzheimer's disease, unspecified: Secondary | ICD-10-CM

## 2021-09-03 DIAGNOSIS — F02A4 Dementia in other diseases classified elsewhere, mild, with anxiety: Secondary | ICD-10-CM

## 2021-09-03 DIAGNOSIS — F320 Major depressive disorder, single episode, mild: Secondary | ICD-10-CM

## 2021-09-03 MED ORDER — SERTRALINE HCL 25 MG PO TABS: 25.0000 mg | ORAL_TABLET | Freq: Every day | ORAL | 0 refills | Status: DC

## 2021-09-03 MED ORDER — RISPERIDONE 1 MG PO TABS: 1.0000 mg | ORAL_TABLET | Freq: Every day | ORAL | 0 refills | Status: DC

## 2021-09-03 NOTE — Assessment & Plan Note (Addendum)
Chronic.  Ongoing.  Still has confusion.  Will add risperadone 1mg  to patient's regimen at bedtime to help with patient's confusion and agitation in the morning. Discussed side effects and benefits of medications during visit.  Follow up in 3 months for reevaluation. Referral placed for CCM to give son resources in the community to help care for his mom.

## 2021-09-03 NOTE — Assessment & Plan Note (Signed)
Chronic. Improved with Zoloft.  Will refill today. Follow up in 3 months for reevaluation.

## 2021-09-03 NOTE — Addendum Note (Signed)
Addended by: [name] on: 09/03/2021 07:02 PM   Modules accepted: Orders

## 2021-09-03 NOTE — Assessment & Plan Note (Signed)
Chronic. Improved with Zoloft during the day.  Will continue with Zoloft daily.  Refills sent today. Follow up in 3 months.

## 2021-10-21 ENCOUNTER — Ambulatory Visit: Payer: Medicare PPO | Admitting: Nurse Practitioner

## 2021-10-21 ENCOUNTER — Other Ambulatory Visit: Payer: Self-pay

## 2021-10-21 ENCOUNTER — Encounter: Payer: Self-pay | Admitting: Nurse Practitioner

## 2021-10-21 VITALS — BP 93/54 | HR 92 | Temp 98.4°F | Wt 138.4 lb

## 2021-10-21 DIAGNOSIS — R32 Unspecified urinary incontinence: Secondary | ICD-10-CM | POA: Diagnosis not present

## 2021-10-21 DIAGNOSIS — N390 Urinary tract infection, site not specified: Secondary | ICD-10-CM | POA: Diagnosis not present

## 2021-10-21 MED ORDER — NITROFURANTOIN MONOHYD MACRO 100 MG PO CAPS: 100.0000 mg | ORAL_CAPSULE | Freq: Two times a day (BID) | ORAL | 0 refills | Status: DC

## 2021-10-21 NOTE — Assessment & Plan Note (Addendum)
Acute change in patient's symptoms since Thursday. Patient was unable to leave a sample during visit. Will treat with Macrobid but instructed son and caregiver to obtain urine sample before first dose of antibiotics. Will send it out for culture.  Discussed lower blood pressure with patient's son and caregiver. Recommend increasing water intake to prevent dehydration.  Discussed checking blood pressures at home and recommend she be seen in the ER if blood pressures at 80/40s.  CMP and CBC drawn in office to evaluate further.  Will make recommendations based on lab results.

## 2021-10-21 NOTE — Progress Notes (Signed)
BP (!) 93/54    Pulse 92    Temp 98.4 F (36.9 C) (Oral)    Wt 138 lb 6.4 oz (62.8 kg)    SpO2 98%    BMI 24.52 kg/m    Subjective:    Patient ID: Katherine Wood, female    DOB: 1941/09/13, 80 y.o.   MRN: [id number]  HPI: Katherine Wood is a 80 y.o. female  Chief Complaint  Patient presents with   Urinary Tract Infection   URINARY SYMPTOMS Patient had incontinence on Thursday late afternoon and all day/night Friday. Wearing depends panties at night and it's full in the morning. She uses the bathroom before bed but still has the issue. Patient's son states that the symptoms have improved but it is new. Patient has been more lethargic, she has been more quiet, sleeping more.  Her urine has foul odor. All of this started last week.     Relevant past medical, surgical, family and social history reviewed and updated as indicated. Interim medical history since our last visit reviewed. Allergies and medications reviewed and updated.  Review of Systems  Constitutional:  Positive for fatigue.  Genitourinary:  Positive for decreased urine volume.   Per HPI unless specifically indicated above     Objective:    BP (!) 93/54    Pulse 92    Temp 98.4 F (36.9 C) (Oral)    Wt 138 lb 6.4 oz (62.8 kg)    SpO2 98%    BMI 24.52 kg/m   Wt Readings from Last 3 Encounters:  10/21/21 138 lb 6.4 oz (62.8 kg)  09/03/21 132 lb (59.9 kg)  06/19/21 139 lb (63 kg)    Physical Exam Vitals and nursing note reviewed.  Constitutional:      General: She is not in acute distress.    Appearance: Normal appearance. She is normal weight. She is not ill-appearing, toxic-appearing or diaphoretic.  HENT:     Head: Normocephalic.     Right Ear: External ear normal.     Left Ear: External ear normal.     Nose: Nose normal.     Mouth/Throat:     Mouth: Mucous membranes are moist.     Pharynx: Oropharynx is clear.  Eyes:     General:        Right eye: No discharge.        Left eye: No  discharge.     Extraocular Movements: Extraocular movements intact.     Conjunctiva/sclera: Conjunctivae normal.     Pupils: Pupils are equal, round, and reactive to light.  Cardiovascular:     Rate and Rhythm: Normal rate and regular rhythm.     Heart sounds: No murmur heard. Pulmonary:     Effort: Pulmonary effort is normal. No respiratory distress.     Breath sounds: Normal breath sounds. No wheezing or rales.  Abdominal:     General: Abdomen is flat. Bowel sounds are normal. There is no distension.     Palpations: Abdomen is soft.     Tenderness: There is no abdominal tenderness. There is no right CVA tenderness, left CVA tenderness or guarding.  Musculoskeletal:     Cervical back: Normal range of motion and neck supple.  Skin:    General: Skin is warm and dry.     Capillary Refill: Capillary refill takes less than 2 seconds.  Neurological:     General: No focal deficit present.     Mental Status: She is alert and  oriented to person, place, and time. Mental status is at baseline.  Psychiatric:        Mood and Affect: Mood normal.        Behavior: Behavior normal.        Thought Content: Thought content normal.        Judgment: Judgment normal.    Results for orders placed or performed in visit on 06/19/21  Hepatitis C antibody  Result Value Ref Range   Hep C Virus Ab <0.1 0.0 - 0.9 s/co ratio  CBC w/Diff  Result Value Ref Range   WBC 5.6 3.4 - 10.8 x10E3/uL   RBC 3.50 (L) 3.77 - 5.28 x10E6/uL   Hemoglobin 11.6 11.1 - 15.9 g/dL   Hematocrit 35.0 34.0 - 46.6 %   MCV 100 (H) 79 - 97 fL   MCH 33.1 (H) 26.6 - 33.0 pg   MCHC 33.1 31.5 - 35.7 g/dL   RDW 13.6 11.7 - 15.4 %   Platelets 316 150 - 450 x10E3/uL   Neutrophils 73 Not Estab. %   Lymphs 15 Not Estab. %   Monocytes 9 Not Estab. %   Eos 1 Not Estab. %   Basos 1 Not Estab. %   Neutrophils Absolute 4.1 1.4 - 7.0 x10E3/uL   Lymphocytes Absolute 0.8 0.7 - 3.1 x10E3/uL   Monocytes Absolute 0.5 0.1 - 0.9 x10E3/uL    EOS (ABSOLUTE) 0.0 0.0 - 0.4 x10E3/uL   Basophils Absolute 0.1 0.0 - 0.2 x10E3/uL   Immature Granulocytes 1 Not Estab. %   Immature Grans (Abs) 0.1 0.0 - 0.1 x10E3/uL      Assessment & Plan:   Problem List Items Addressed This Visit       Genitourinary   Acute urinary tract infection - Primary    Acute change in patient's symptoms since Thursday. Patient was unable to leave a sample during visit. Will treat with Macrobid but instructed son and caregiver to obtain urine sample before first dose of antibiotics. Will send it out for culture.  Discussed lower blood pressure with patient's son and caregiver. Recommend increasing water intake to prevent dehydration.  Discussed checking blood pressures at home and recommend she be seen in the ER if blood pressures at 80/40s.  CMP and CBC drawn in office to evaluate further.  Will make recommendations based on lab results.       Relevant Medications   nitrofurantoin, macrocrystal-monohydrate, (MACROBID) 100 MG capsule   Other Visit Diagnoses     Urinary incontinence, unspecified type       Relevant Orders   Urinalysis, Routine w reflex microscopic   CBC w/Diff   Urine Culture   Comp Met (CMET)        Follow up plan: Return if symptoms worsen or fail to improve.

## 2021-10-22 ENCOUNTER — Telehealth: Payer: Self-pay | Admitting: *Deleted

## 2021-10-22 ENCOUNTER — Telehealth: Payer: Self-pay

## 2021-10-22 LAB — CBC WITH DIFFERENTIAL/PLATELET
Basophils Absolute: 0 10*3/uL (ref 0.0–0.2)
Basos: 0 %
EOS (ABSOLUTE): 0 10*3/uL (ref 0.0–0.4)
Eos: 0 %
Hematocrit: 31.2 % — ABNORMAL LOW (ref 34.0–46.6)
Hemoglobin: 10.2 g/dL — ABNORMAL LOW (ref 11.1–15.9)
Immature Grans (Abs): 0.1 10*3/uL (ref 0.0–0.1)
Immature Granulocytes: 1 %
Lymphocytes Absolute: 0.9 10*3/uL (ref 0.7–3.1)
Lymphs: 13 %
MCH: 32.7 pg (ref 26.6–33.0)
MCHC: 32.7 g/dL (ref 31.5–35.7)
MCV: 100 fL — ABNORMAL HIGH (ref 79–97)
Monocytes Absolute: 0.6 10*3/uL (ref 0.1–0.9)
Monocytes: 9 %
Neutrophils Absolute: 5.5 10*3/uL (ref 1.4–7.0)
Neutrophils: 77 %
Platelets: 349 10*3/uL (ref 150–450)
RBC: 3.12 x10E6/uL — ABNORMAL LOW (ref 3.77–5.28)
RDW: 13.2 % (ref 11.7–15.4)
WBC: 7.1 10*3/uL (ref 3.4–10.8)

## 2021-10-22 LAB — COMPREHENSIVE METABOLIC PANEL
ALT: 10 IU/L (ref 0–32)
AST: 12 IU/L (ref 0–40)
Albumin/Globulin Ratio: 1.3 (ref 1.2–2.2)
Albumin: 3.8 g/dL (ref 3.7–4.7)
Alkaline Phosphatase: 74 IU/L (ref 44–121)
BUN/Creatinine Ratio: 20 (ref 12–28)
BUN: 21 mg/dL (ref 8–27)
Bilirubin Total: 0.3 mg/dL (ref 0.0–1.2)
CO2: 21 mmol/L (ref 20–29)
Calcium: 8.8 mg/dL (ref 8.7–10.3)
Chloride: 106 mmol/L (ref 96–106)
Creatinine, Ser: 1.05 mg/dL — ABNORMAL HIGH (ref 0.57–1.00)
Globulin, Total: 2.9 g/dL (ref 1.5–4.5)
Glucose: 88 mg/dL (ref 70–99)
Potassium: 3.7 mmol/L (ref 3.5–5.2)
Sodium: 141 mmol/L (ref 134–144)
Total Protein: 6.7 g/dL (ref 6.0–8.5)
eGFR: 54 mL/min/{1.73_m2} — ABNORMAL LOW (ref >59)

## 2021-10-22 LAB — URINALYSIS, ROUTINE W REFLEX MICROSCOPIC
Bilirubin, UA: NEGATIVE
Glucose, UA: NEGATIVE
Ketones, UA: NEGATIVE
Nitrite, UA: NEGATIVE
Specific Gravity, UA: 1.025 (ref 1.005–1.030)
Urobilinogen, Ur: 0.2 mg/dL (ref 0.2–1.0)
pH, UA: 5.5 (ref 5.0–7.5)

## 2021-10-22 LAB — MICROSCOPIC EXAMINATION

## 2021-10-22 NOTE — Chronic Care Management (AMB) (Signed)
Chronic Care Management   Note  10/22/2021 Name: Katherine Wood MRN: [id number] DOB: 11/09/1940  Katherine Wood is a 80 y.o. year old female who is a primary care patient of Jon Billings, NP. I reached out to Katherine Wood by phone today in response to a referral sent by [facility]'s PCP.  Katherine Wood was given information about Chronic Care Management services today including:  CCM service includes personalized support from designated clinical staff supervised by her physician, including individualized plan of care and coordination with other care providers 24/7 contact phone numbers for assistance for urgent and routine care needs. Service will only be billed when office clinical staff spend 20 minutes or more in a month to coordinate care. Only one practitioner may furnish and bill the service in a calendar month. The patient may stop CCM services at any time (effective at the end of the month) by phone call to the office staff. The patient is responsible for co-pay (up to 20% after annual deductible is met) if co-pay is required by the individual health plan.   Katherine Wood son  verbally agreed to assistance and services provided by embedded care coordination/care management team today.  Follow up plan: Telephone appointment with care management team member scheduled for:  Katherine Wood Management  Direct Dial: [phone]

## 2021-10-22 NOTE — Addendum Note (Signed)
Addended by: [name] on: 10/22/2021 12:07 PM   Modules accepted: Orders

## 2021-10-22 NOTE — Progress Notes (Signed)
Please let patient's son know that her urine did show that she has some bacteria and blood. This is likely why her blood counts are lower than they have been.  I would like to see her again next week to make sure she is improving.  Also, please make sure she is drinking plenty of liquids and well hydrated.  I have sent the urine for culture to see if she needs a different antibiotic.  Please let me know if her son has any questions.

## 2021-10-22 NOTE — Telephone Encounter (Signed)
° °  Telephone encounter was:  Successful.  10/22/2021 Name: Katherine Wood MRN: [id number] DOB: [birth year]  Katherine Wood is a 80 y.o. year old female who is a primary care patient of [name], NP . The community resource team was consulted for assistance with  caregiver support/respite care . Per referral:  referral stated to provide resources in the community to help care for his mom for dementia.  Care guide performed the following interventions:  I spoke to son, son is more so looking for an evaluation to be done on mom to see what stage she is in with dementia and to ensure she is taking the correct medications. Sending request for re-assignment . He stated he is more so looking to speak to someone who can arrange these services.  Follow Up Plan:  No further follow up planned at this time. The patient has been provided with needed resources. Referral sent for re-assignment for Social worker.  [facility] Guide, Embedded Care Coordination [facility]  [place], [place] [address]  Main Phone: [phone]   E-mail: [email or web address]  Website: [email or web address]

## 2021-10-23 NOTE — Progress Notes (Signed)
[name] urine does show that she has a UTI. I have sent it for culture to make sure we have her on the right antibiotic.  I will let you know when that returns.  Please let me know if she is not improving.

## 2021-10-26 LAB — URINE CULTURE

## 2021-10-27 NOTE — Progress Notes (Signed)
Please let patient's son know that [name]'s urine grew bacteria.  This is likely why she was urinating on herself.  The antibiotic that was given to her should improve her symptoms.  I will still plan to follow up with her as scheduled.

## 2021-10-29 ENCOUNTER — Encounter: Payer: Self-pay | Admitting: Nurse Practitioner

## 2021-10-29 ENCOUNTER — Other Ambulatory Visit: Payer: Self-pay

## 2021-10-29 ENCOUNTER — Ambulatory Visit: Payer: Medicare PPO | Admitting: Nurse Practitioner

## 2021-10-29 VITALS — BP 114/63 | HR 84 | Temp 98.2°F | Wt 133.8 lb

## 2021-10-29 DIAGNOSIS — N390 Urinary tract infection, site not specified: Secondary | ICD-10-CM

## 2021-10-29 DIAGNOSIS — F02A4 Dementia in other diseases classified elsewhere, mild, with anxiety: Secondary | ICD-10-CM

## 2021-10-29 DIAGNOSIS — G309 Alzheimer's disease, unspecified: Secondary | ICD-10-CM

## 2021-10-29 MED ORDER — ALPRAZOLAM 0.25 MG PO TABS: 0.2500 mg | ORAL_TABLET | Freq: Two times a day (BID) | ORAL | 0 refills | Status: DC | PRN

## 2021-10-29 MED ORDER — SERTRALINE HCL 50 MG PO TABS: 50.0000 mg | ORAL_TABLET | Freq: Every day | ORAL | 1 refills | Status: AC

## 2021-10-29 NOTE — Assessment & Plan Note (Signed)
Chronic. Progressing.  Patient is more anxious in the mornings. Will increase Zoloft to 50mg  daily.  Will give Xanax 0.25mg  PRN for times when Son is having difficulty settling patient down. Discussed with son that it is important to have ACP on file. Should have discussion in the future about patient's wishes moving forward.

## 2021-10-29 NOTE — Progress Notes (Signed)
BP 114/63    Pulse 84    Temp 98.2 F (36.8 C) (Oral)    Wt 133 lb 12.8 oz (60.7 kg)    SpO2 97%    BMI 23.70 kg/m    Subjective:    Patient ID: Katherine Wood, female    DOB: 09-09-1941, 81 y.o.   MRN: [id number]  HPI: Katherine Wood is a 81 y.o. female  Chief Complaint  Patient presents with   Urinary Tract Infection    1 week f/up- pt's son states that the patient is doing much better   Breathing Problem    Pt's son states that the patient has been breathing a lot harder within the last week    URINARY SYMPTOMS Patient is seen today for follow up on UTI. Patient's symptoms have improved.  No incontinence since her last visit.  She completed her course of antibiotics.    BREATHING Patient's son states his mom has been breathing a lot harder lately.  He states she has been very nervous. He states she is very nervous at the time when she is breathing harder. Patient is sleeping a lot more recently, less talkative, and eating less.   Relevant past medical, surgical, family and social history reviewed and updated as indicated. Interim medical history since our last visit reviewed. Allergies and medications reviewed and updated.  Review of Systems  Constitutional:  Positive for fatigue.  Psychiatric/Behavioral:  The patient is nervous/anxious.    Per HPI unless specifically indicated above     Objective:    BP 114/63    Pulse 84    Temp 98.2 F (36.8 C) (Oral)    Wt 133 lb 12.8 oz (60.7 kg)    SpO2 97%    BMI 23.70 kg/m   Wt Readings from Last 3 Encounters:  10/29/21 133 lb 12.8 oz (60.7 kg)  10/21/21 138 lb 6.4 oz (62.8 kg)  09/03/21 132 lb (59.9 kg)    Physical Exam Vitals and nursing note reviewed.  Constitutional:      General: She is not in acute distress.    Appearance: Normal appearance. She is normal weight. She is not ill-appearing, toxic-appearing or diaphoretic.  HENT:     Head: Normocephalic.     Right Ear: External ear normal.     Left Ear:  External ear normal.     Nose: Nose normal.     Mouth/Throat:     Mouth: Mucous membranes are moist.     Pharynx: Oropharynx is clear.  Eyes:     General:        Right eye: No discharge.        Left eye: No discharge.     Extraocular Movements: Extraocular movements intact.     Conjunctiva/sclera: Conjunctivae normal.     Pupils: Pupils are equal, round, and reactive to light.  Cardiovascular:     Rate and Rhythm: Normal rate and regular rhythm.     Heart sounds: No murmur heard. Pulmonary:     Effort: Pulmonary effort is normal. No respiratory distress.     Breath sounds: Normal breath sounds. No wheezing or rales.  Abdominal:     General: Abdomen is flat. Bowel sounds are normal. There is no distension.     Palpations: Abdomen is soft.     Tenderness: There is no abdominal tenderness. There is no right CVA tenderness, left CVA tenderness or guarding.  Musculoskeletal:     Cervical back: Normal range of motion and neck  supple.  Skin:    General: Skin is warm and dry.     Capillary Refill: Capillary refill takes less than 2 seconds.  Neurological:     General: No focal deficit present.     Mental Status: She is alert and oriented to person, place, and time. Mental status is at baseline.  Psychiatric:        Mood and Affect: Mood is anxious.        Behavior: Behavior normal.        Thought Content: Thought content normal.        Judgment: Judgment normal.    Results for orders placed or performed in visit on 10/21/21  Urine Culture   Specimen: Urine   UC  Result Value Ref Range   Urine Culture, Routine Final report (A)    Organism ID, Bacteria Escherichia coli (A)    Antimicrobial Susceptibility Comment   Microscopic Examination   Urine  Result Value Ref Range   WBC, UA 0-5 0 - 5 /hpf   RBC 3-10 (A) 0 - 2 /hpf   Epithelial Cells (non renal) 0-10 0 - 10 /hpf   Renal Epithel, UA 0-10 (A) None seen /hpf   Mucus, UA Present (A) Not Estab.   Bacteria, UA Many (A) None  seen/Few  CBC w/Diff  Result Value Ref Range   WBC 7.1 3.4 - 10.8 x10E3/uL   RBC 3.12 (L) 3.77 - 5.28 x10E6/uL   Hemoglobin 10.2 (L) 11.1 - 15.9 g/dL   Hematocrit 31.2 (L) 34.0 - 46.6 %   MCV 100 (H) 79 - 97 fL   MCH 32.7 26.6 - 33.0 pg   MCHC 32.7 31.5 - 35.7 g/dL   RDW 13.2 11.7 - 15.4 %   Platelets 349 150 - 450 x10E3/uL   Neutrophils 77 Not Estab. %   Lymphs 13 Not Estab. %   Monocytes 9 Not Estab. %   Eos 0 Not Estab. %   Basos 0 Not Estab. %   Neutrophils Absolute 5.5 1.4 - 7.0 x10E3/uL   Lymphocytes Absolute 0.9 0.7 - 3.1 x10E3/uL   Monocytes Absolute 0.6 0.1 - 0.9 x10E3/uL   EOS (ABSOLUTE) 0.0 0.0 - 0.4 x10E3/uL   Basophils Absolute 0.0 0.0 - 0.2 x10E3/uL   Immature Granulocytes 1 Not Estab. %   Immature Grans (Abs) 0.1 0.0 - 0.1 x10E3/uL  Comp Met (CMET)  Result Value Ref Range   Glucose 88 70 - 99 mg/dL   BUN 21 8 - 27 mg/dL   Creatinine, Ser 1.05 (H) 0.57 - 1.00 mg/dL   eGFR 54 (L) >59 mL/min/1.73   BUN/Creatinine Ratio 20 12 - 28   Sodium 141 134 - 144 mmol/L   Potassium 3.7 3.5 - 5.2 mmol/L   Chloride 106 96 - 106 mmol/L   CO2 21 20 - 29 mmol/L   Calcium 8.8 8.7 - 10.3 mg/dL   Total Protein 6.7 6.0 - 8.5 g/dL   Albumin 3.8 3.7 - 4.7 g/dL   Globulin, Total 2.9 1.5 - 4.5 g/dL   Albumin/Globulin Ratio 1.3 1.2 - 2.2   Bilirubin Total 0.3 0.0 - 1.2 mg/dL   Alkaline Phosphatase 74 44 - 121 IU/L   AST 12 0 - 40 IU/L   ALT 10 0 - 32 IU/L  Urinalysis, Routine w reflex microscopic  Result Value Ref Range   Specific Gravity, UA 1.025 1.005 - 1.030   pH, UA 5.5 5.0 - 7.5   Color, UA Yellow Yellow   Appearance  Ur Cloudy (A) Clear   Leukocytes,UA 1+ (A) Negative   Protein,UA Trace (A) Negative/Trace   Glucose, UA Negative Negative   Ketones, UA Negative Negative   RBC, UA 2+ (A) Negative   Bilirubin, UA Negative Negative   Urobilinogen, Ur 0.2 0.2 - 1.0 mg/dL   Nitrite, UA Negative Negative   Microscopic Examination See below:       Assessment & Plan:    Problem List Items Addressed This Visit       Nervous and Auditory   Alzheimer's dementia ([place]) - Primary    Chronic. Progressing.  Patient is more anxious in the mornings. Will increase Zoloft to 48m daily.  Will give Xanax 0.287mPRN for times when Son is having difficulty settling patient down. Discussed with son that it is important to have ACP on file. Should have discussion in the future about patient's wishes moving forward.      Relevant Medications   sertraline (ZOLOFT) 50 MG tablet   ALPRAZolam (XANAX) 0.25 MG tablet     Genitourinary   Acute urinary tract infection   Relevant Orders   Urinalysis, Routine w reflex microscopic     Follow up plan: Return if symptoms worsen or fail to improve.

## 2021-10-30 ENCOUNTER — Ambulatory Visit (INDEPENDENT_AMBULATORY_CARE_PROVIDER_SITE_OTHER): Payer: Medicare PPO | Admitting: Licensed Clinical Social Worker

## 2021-10-30 DIAGNOSIS — F411 Generalized anxiety disorder: Secondary | ICD-10-CM

## 2021-10-30 DIAGNOSIS — F320 Major depressive disorder, single episode, mild: Secondary | ICD-10-CM

## 2021-10-30 DIAGNOSIS — M159 Polyosteoarthritis, unspecified: Secondary | ICD-10-CM

## 2021-10-30 DIAGNOSIS — N390 Urinary tract infection, site not specified: Secondary | ICD-10-CM | POA: Diagnosis not present

## 2021-10-30 DIAGNOSIS — G309 Alzheimer's disease, unspecified: Secondary | ICD-10-CM

## 2021-10-30 LAB — URINALYSIS, ROUTINE W REFLEX MICROSCOPIC
Bilirubin, UA: NEGATIVE
Glucose, UA: NEGATIVE
Ketones, UA: NEGATIVE
Nitrite, UA: NEGATIVE
Protein,UA: NEGATIVE
Specific Gravity, UA: 1.02 (ref 1.005–1.030)
Urobilinogen, Ur: 0.2 mg/dL (ref 0.2–1.0)
pH, UA: 5.5 (ref 5.0–7.5)

## 2021-10-30 LAB — MICROSCOPIC EXAMINATION

## 2021-10-30 NOTE — Progress Notes (Signed)
Will send for culture.

## 2021-10-30 NOTE — Addendum Note (Signed)
Addended by: [name] on: 10/30/2021 03:46 PM   Modules accepted: Orders

## 2021-11-02 NOTE — Chronic Care Management (AMB) (Signed)
Chronic Care Management    Clinical Social Work Note  11/02/2021 Name: Katherine Wood MRN: [id number] DOB: 1941-04-05  Katherine Wood is a 81 y.o. year old female who is a primary care patient of Jon Billings, NP. The CCM team was consulted to assist the patient with chronic disease management and/or care coordination needs related to: Mental Health Counseling and Resources and Caregiver Stress.   Engaged with patient's adult children, Katherine Wood and Katherine Wood, by telephone for initial visit in response to provider referral for social work chronic care management and care coordination services.   Consent to Services:  The patient was given the following information about Chronic Care Management services today, agreed to services, and gave verbal consent: 1. CCM service includes personalized support from designated clinical staff supervised by the primary care provider, including individualized plan of care and coordination with other care providers 2. 24/7 contact phone numbers for assistance for urgent and routine care needs. 3. Service will only be billed when office clinical staff spend 20 minutes or more in a month to coordinate care. 4. Only one practitioner may furnish and bill the service in a calendar month. 5.The patient may stop CCM services at any time (effective at the end of the month) by phone call to the office staff. 6. The patient will be responsible for cost sharing (co-pay) of up to 20% of the service fee (after annual deductible is met). Patient agreed to services and consent obtained.  Patient agreed to services and consent obtained.   Consent to Services:  The patient was given information about Care Management services, agreed to services, and gave verbal consent prior to initiation of services.  Please see initial visit note for detailed documentation.   Patient agreed to services today and consent obtained.  Engaged with patient's adult children by phone in response  to provider referral for social work care coordination services:  Assessment/Interventions: Assessed patient's previous and current treatment, coping skills, support system and barriers to care. LCSW discussed differences between hospice vs palliative care. Family are interested in a palliative care referral and referral to Neurology. CCM LCSW will send info on [facility], in addition, to caregiver support resources to e-mail provided See Care Plan below for interventions and patient self-care activities.  Recent life changes or stressors: Management of health conditions  Recommendation: Patient may benefit from, and is in agreement work with LCSW to address care coordination needs and will continue to work with the clinical team to address health care and disease management related needs.   Follow up Plan: Patient would like continued follow-up from CCM LCSW.  per patient's request will follow up in 12/21/21.  Will call office if needed prior to next encounter.   SDOH (Social Determinants of Health) assessments and interventions performed:    Advanced Directives Status: Not addressed in this encounter.  CCM Care Plan  Allergies  Allergen Reactions   Alendronate Sodium Other (See Comments)    Joint pains Joint pains   Galantamine Other (See Comments)    Outpatient Encounter Medications as of 10/30/2021  Medication Sig   ALPRAZolam (XANAX) 0.25 MG tablet Take 1 tablet (0.25 mg total) by mouth 2 (two) times daily as needed for anxiety.   Calcium Carbonate (CALCIUM 600 PO) Take 900 mg by mouth daily.   Cholecalciferol (VITAMIN D3) 5000 units CAPS Take 5,000 Units by mouth daily.   risperiDONE (RISPERDAL) 1 MG tablet Take 1 tablet (1 mg total) by mouth at bedtime.   sertraline (  ZOLOFT) 50 MG tablet Take 1 tablet (50 mg total) by mouth daily.   vitamin E 400 UNIT capsule Take 1,000 Units by mouth daily.   No facility-administered encounter medications on file as of 10/30/2021.     Patient Active Problem List   Diagnosis Date Noted   Acute urinary tract infection 10/21/2021   Allergy    Mild single current episode of major depressive disorder ([place]) 12/07/2016   History of creation of ostomy ([place]) 08/17/2016   Anemia 04/07/2016   Diverticulitis large intestine 02/25/2016   Hernia of abdominal cavity    Alzheimer's dementia ([place]) 07/27/2015   Memory loss 05/16/2015   Narrowing of intervertebral disc space 02/13/2015   Hypercholesteremia 02/13/2015   Symptomatic menopausal or female climacteric states 02/13/2015   Arthritis, degenerative 02/13/2015   Vitamin D deficiency 02/13/2015   Connective tissue and disc stenosis of intervertebral foramina of abdomen and other regions 02/13/2015   Anxiety state 10/31/2008   Arthropathia 10/30/2008   Leg varices 10/30/2008   Acquired cyst of kidney 10/30/2008   Insomnia 10/30/2008   OP (osteoporosis) 10/30/2008   Cystocele, midline 10/30/2008    Conditions to be addressed/monitored: Anxiety, Depression, Dementia, and Osteoporosis; Memory Deficits, Inability to perform ADL's independently, and Inability to perform IADL's independently  Care Plan : LCSW Plan of Care  Updates made by Rebekah Chesterfield, LCSW since 11/02/2021 12:00 AM     Problem: Quality of Life (General Plan of Care)      Long-Range Goal: Quality of Life Maintained   Start Date: 10/30/2021  This Visit's Progress: On track  Priority: High  Note:   Current barriers:   Severe Persistent Mental Health needs related to Alzheimer's Dementia, Depression, and Anxiety Memory Deficits, Inability to perform ADL's independently, and Inability to perform IADL's independently Needs Support, Education, and Care Coordination in order to meet unmet mental health needs. Clinical Goal(s): demonstrate a reduction in symptoms related to :Depression: anxiety   Clinical Interventions:  Assessed patient's previous and current treatment, coping skills, support system and  barriers to care. All hx was provided by patient's adult children, Katherine Wood and Katherine Wood Both were informed that they would have to speak with CCM LCSW prior to a referral to a neurologist to assess Alzheimer's Dementia. LCSW shared that they may have needed a referral from PCP prior to scheduling an appointment with a specialist. LCSW collaborated with PCP regarding family's request to a Neurology and palliative care referral Patient receives strong support from family, which includes three caregivers. They have attended various caregiver seminars and obtained resources. Both were successful in identifying healthy coping skills to assist with stress Patient's bladder infection has improved and her appetite has returned. Katherine Wood has picked up meds this morning LCSW discussed differences between hospice vs palliative care. Family are interested in a palliative care referral. CCM LCSW will send info on [facility], in addition, to caregiver support resources to e-mail provided Active listening / Reflection utilized  Emotional Support Provided Problem Imogene strategies reviewed Provided psychoeducation for mental health needs  Caregiver stress acknowledged  Increase in actives / exercise encouraged  Verbalization of feelings encouraged  ; Review resources, discussed options and provided patient information about  Dementia resources and support  1:1 collaboration with primary care provider regarding development and update of comprehensive plan of care as evidenced by provider attestation and co-signature Inter-disciplinary care team collaboration (see longitudinal plan of care) Patient Goals/Self-Care Activities: Over the next 120 days Attend scheduled appts with providers Utilize  healthy coping skills and/or resources discussed Contact PCP office with any questions or concerns        Christa See, MSW, [email or web address] Phone [phone] 5:02 PM

## 2021-11-02 NOTE — Patient Instructions (Signed)
Visit Information  Thank you for taking time to visit with me today. Please don't hesitate to contact me if I can be of assistance to you before our next scheduled telephone appointment.  Following are the goals we discussed today:  Patient Goals/Self-Care Activities: Over the next 120 days Attend scheduled appts with providers Utilize healthy coping skills and/or resources discussed Contact PCP office with any questions or concerns  Our next appointment is by telephone on 12/21/21   Please call the care guide team at [phone] if you need to cancel or reschedule your appointment.   If you are experiencing a Mental Health or Behavioral Health Crisis or need someone to talk to, please call the Suicide and Crisis Lifeline: 988 call 911   Following is a copy of your full plan of care:  Care Plan : LCSW Plan of Care  Updates made by Bridgett Larsson, LCSW since 11/02/2021 12:00 AM     Problem: Quality of Life (General Plan of Care)      Long-Range Goal: Quality of Life Maintained   Start Date: 10/30/2021  This Visit's Progress: On track  Priority: High  Note:   Current barriers:   Severe Persistent Mental Health needs related to Alzheimer's Dementia, Depression, and Anxiety Memory Deficits, Inability to perform ADL's independently, and Inability to perform IADL's independently Needs Support, Education, and Care Coordination in order to meet unmet mental health needs. Clinical Goal(s): demonstrate a reduction in symptoms related to :Depression: anxiety   Clinical Interventions:  Assessed patient's previous and current treatment, coping skills, support system and barriers to care. All hx was provided by patient's adult children, Jill Alexanders and [name] Both were informed that they would have to speak with CCM LCSW prior to a referral to a neurologist to assess Alzheimer's Dementia. LCSW shared that they may have needed a referral from PCP prior to scheduling an appointment with a specialist.  LCSW collaborated with PCP regarding family's request to a Neurology and palliative care referral Patient receives strong support from family, which includes three caregivers. They have attended various caregiver seminars and obtained resources. Both were successful in identifying healthy coping skills to assist with stress Patient's bladder infection has improved and her appetite has returned. Jill Alexanders has picked up meds this morning LCSW discussed differences between hospice vs palliative care. Family are interested in a palliative care referral. CCM LCSW will send info on [facility], in addition, to caregiver support resources to e-mail provided Active listening / Reflection utilized  Emotional Support Provided Problem Solving /Task Center strategies reviewed Provided psychoeducation for mental health needs  Caregiver stress acknowledged  Increase in actives / exercise encouraged  Verbalization of feelings encouraged  ; Review resources, discussed options and provided patient information about  Dementia resources and support  1:1 collaboration with primary care provider regarding development and update of comprehensive plan of care as evidenced by provider attestation and co-signature Inter-disciplinary care team collaboration (see longitudinal plan of care) Patient Goals/Self-Care Activities: Over the next 120 days Attend scheduled appts with providers Utilize healthy coping skills and/or resources discussed Contact PCP office with any questions or concerns       Ms. Woodin was given information about Care Management services by the embedded care coordination team including:  Care Management services include personalized support from designated clinical staff supervised by her physician, including individualized plan of care and coordination with other care providers 24/7 contact phone numbers for assistance for urgent and routine care needs. The patient may stop CCM  services  at any time (effective at the end of the month) by phone call to the office staff.  Patient agreed to services and verbal consent obtained.   Patient verbalizes understanding of instructions provided today and agrees to view in MyChart.   Jenel Lucks, MSW, LCSW [facility] Care [facility] [place]   [facility] [email or web address] Phone [phone] 5:07 PM

## 2021-11-03 ENCOUNTER — Telehealth: Payer: Self-pay | Admitting: Nurse Practitioner

## 2021-11-03 DIAGNOSIS — F02C4 Dementia in other diseases classified elsewhere, severe, with anxiety: Secondary | ICD-10-CM | POA: Diagnosis not present

## 2021-11-03 DIAGNOSIS — R413 Other amnesia: Secondary | ICD-10-CM | POA: Diagnosis not present

## 2021-11-03 DIAGNOSIS — G479 Sleep disorder, unspecified: Secondary | ICD-10-CM | POA: Diagnosis not present

## 2021-11-03 DIAGNOSIS — G309 Alzheimer's disease, unspecified: Secondary | ICD-10-CM

## 2021-11-03 DIAGNOSIS — G301 Alzheimer's disease with late onset: Secondary | ICD-10-CM | POA: Diagnosis not present

## 2021-11-03 LAB — URINE CULTURE

## 2021-11-03 MED ORDER — CIPROFLOXACIN HCL 500 MG PO TABS: 500.0000 mg | ORAL_TABLET | Freq: Two times a day (BID) | ORAL | 0 refills | Status: AC

## 2021-11-03 NOTE — Telephone Encounter (Signed)
-----   Message from Bridgett Larsson, [place] sent at 11/02/2021  5:09 PM EST ----- Cleone Slim Clydie Braun! The family are requesting a referral to Neurology [facility]) and palliative care. Thanks!

## 2021-11-03 NOTE — Progress Notes (Signed)
Please let patient's son know that her urine still shows that it has bacteria. That means the first antibiotic did not clear the infection.  I will send in a prescription for Ciprofloxacin for 3 days and I would like him to bring in another urine sample 1 week after she completes the medication.

## 2021-11-03 NOTE — Addendum Note (Signed)
Addended by: [name] on: 11/03/2021 09:21 AM   Modules accepted: Orders

## 2021-11-06 ENCOUNTER — Telehealth: Payer: Self-pay | Admitting: Student

## 2021-11-06 NOTE — Telephone Encounter (Signed)
Spoke with patient's daughter [name], regarding the Palliative referral/services and she was in agreement with beginning services.  I have scheduled an In-home Consult for 11/16/21 @ 2:30 PM

## 2021-11-11 ENCOUNTER — Other Ambulatory Visit: Payer: Medicare PPO

## 2021-11-11 ENCOUNTER — Telehealth: Payer: Self-pay | Admitting: Nurse Practitioner

## 2021-11-11 DIAGNOSIS — N390 Urinary tract infection, site not specified: Secondary | ICD-10-CM

## 2021-11-11 NOTE — Progress Notes (Signed)
Please let patient's son know that her urine came back clean. No evidence of bacteria. No need for further treatment at this time.

## 2021-11-11 NOTE — Telephone Encounter (Signed)
Order placed for repeat urinalysis

## 2021-11-12 LAB — URINALYSIS, ROUTINE W REFLEX MICROSCOPIC
Bilirubin, UA: NEGATIVE
Glucose, UA: NEGATIVE
Ketones, UA: NEGATIVE
Leukocytes,UA: NEGATIVE
Nitrite, UA: NEGATIVE
Protein,UA: NEGATIVE
RBC, UA: NEGATIVE
Specific Gravity, UA: 1.02 (ref 1.005–1.030)
Urobilinogen, Ur: 0.2 mg/dL (ref 0.2–1.0)
pH, UA: 5.5 (ref 5.0–7.5)

## 2021-11-16 ENCOUNTER — Other Ambulatory Visit: Payer: Self-pay

## 2021-11-16 ENCOUNTER — Other Ambulatory Visit: Payer: Self-pay | Admitting: Student

## 2021-11-20 ENCOUNTER — Other Ambulatory Visit: Payer: Self-pay | Admitting: Nurse Practitioner

## 2021-11-23 MED ORDER — ALPRAZOLAM 0.25 MG PO TABS: 0.2500 mg | ORAL_TABLET | Freq: Two times a day (BID) | ORAL | 0 refills | Status: DC | PRN

## 2021-11-24 DIAGNOSIS — F02A4 Dementia in other diseases classified elsewhere, mild, with anxiety: Secondary | ICD-10-CM

## 2021-11-24 DIAGNOSIS — G309 Alzheimer's disease, unspecified: Secondary | ICD-10-CM

## 2021-11-24 DIAGNOSIS — F320 Major depressive disorder, single episode, mild: Secondary | ICD-10-CM

## 2021-11-27 ENCOUNTER — Other Ambulatory Visit: Payer: Self-pay

## 2021-11-27 ENCOUNTER — Other Ambulatory Visit: Payer: Self-pay | Admitting: Student

## 2021-11-27 DIAGNOSIS — R630 Anorexia: Secondary | ICD-10-CM

## 2021-11-27 DIAGNOSIS — G309 Alzheimer's disease, unspecified: Secondary | ICD-10-CM

## 2021-11-27 DIAGNOSIS — Z515 Encounter for palliative care: Secondary | ICD-10-CM

## 2021-11-27 DIAGNOSIS — F411 Generalized anxiety disorder: Secondary | ICD-10-CM
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
## 2021-12-03 ENCOUNTER — Telehealth: Payer: Self-pay

## 2021-12-03 NOTE — Telephone Encounter (Signed)
12/03/2021 @10  AM: PC SW outreached patients son, , per [facility] NP - [facility] [address] referral request to discuss patient needs regarding day programs and long term planning.   Call unsuccessful. SW LVM with contact information.  SW mail related resources to address on file for pt/son to review along with SW contact info.

## 2021-12-04 ENCOUNTER — Ambulatory Visit: Payer: Medicare PPO | Admitting: Nurse Practitioner

## 2021-12-04 ENCOUNTER — Other Ambulatory Visit: Payer: Self-pay

## 2021-12-04 ENCOUNTER — Encounter: Payer: Self-pay | Admitting: Nurse Practitioner

## 2021-12-04 VITALS — BP 119/76 | HR 84 | Temp 97.8°F | Wt 119.6 lb

## 2021-12-04 DIAGNOSIS — F320 Major depressive disorder, single episode, mild: Secondary | ICD-10-CM | POA: Diagnosis not present

## 2021-12-04 DIAGNOSIS — F411 Generalized anxiety disorder: Secondary | ICD-10-CM | POA: Diagnosis not present

## 2021-12-04 DIAGNOSIS — F0284 Dementia in other diseases classified elsewhere, unspecified severity, with anxiety: Secondary | ICD-10-CM

## 2021-12-04 DIAGNOSIS — Z939 Artificial opening status, unspecified: Secondary | ICD-10-CM

## 2021-12-04 DIAGNOSIS — Z933 Colostomy status: Secondary | ICD-10-CM | POA: Diagnosis not present

## 2021-12-04 DIAGNOSIS — G309 Alzheimer's disease, unspecified: Secondary | ICD-10-CM | POA: Diagnosis not present

## 2021-12-04 MED ORDER — MIRTAZAPINE 7.5 MG PO TABS: 7.5000 mg | ORAL_TABLET | Freq: Every day | ORAL | 0 refills | Status: AC

## 2021-12-04 NOTE — Assessment & Plan Note (Signed)
Chronic. Has not established with a gastroenterologist.  Referral placed today.

## 2021-12-04 NOTE — Assessment & Plan Note (Signed)
Chronic. Uses Xanax PRN when patient is very anxious and he isn't able to calm her down. She does sleep well at night and the Zoloft helps with her anxiety on a regular basis.  Follow up in 1 month for reevaluation.

## 2021-12-04 NOTE — Progress Notes (Signed)
BP 119/76    Pulse 84    Temp 97.8 F (36.6 C) (Oral)    Wt 119 lb 9.6 oz (54.3 kg)    SpO2 94%    BMI 21.19 kg/m    Subjective:    Patient ID: Katherine Wood, female    DOB: [birth year], 81 y.o.   MRN: [id number]  HPI: Katherine Wood is a 81 y.o. female  Chief Complaint  Patient presents with   Depression   MEMORY Patient's son states patient is talking less than when she was here last.  Her appetite has decreased.  She won't go to the bathroom by herself.  She is using depends.  They saw Neurology who recommended Remeron.     MOOD Patient's son is continuing to give her the Zoloft.  He only uses the Xanax when he has a hard time calming her down.  This usually is in the mornings when she is very disoriented.    [facility] are working with [facility] and [facility].    Denies other concerns at visit today.   Relevant past medical, surgical, family and social history reviewed and updated as indicated. Interim medical history since our last visit reviewed. Allergies and medications reviewed and updated.  Review of Systems  Constitutional:  Positive for appetite change and unexpected weight change.  Psychiatric/Behavioral:  Positive for behavioral problems and confusion. Negative for dysphoric mood. The patient is not nervous/anxious.    Per HPI unless specifically indicated above     Objective:    BP 119/76    Pulse 84    Temp 97.8 F (36.6 C) (Oral)    Wt 119 lb 9.6 oz (54.3 kg)    SpO2 94%    BMI 21.19 kg/m   Wt Readings from Last 3 Encounters:  12/04/21 119 lb 9.6 oz (54.3 kg)  10/29/21 133 lb 12.8 oz (60.7 kg)  10/21/21 138 lb 6.4 oz (62.8 kg)    Physical Exam Vitals and nursing note reviewed.  Constitutional:      General: She is not in acute distress.    Appearance: Normal appearance. She is not ill-appearing, toxic-appearing or diaphoretic.  HENT:     Head: Normocephalic.     Right Ear: External ear normal.     Left Ear: External ear normal.      Nose: Nose normal.     Mouth/Throat:     Mouth: Mucous membranes are moist.     Pharynx: Oropharynx is clear.  Eyes:     General:        Right eye: No discharge.        Left eye: No discharge.     Extraocular Movements: Extraocular movements intact.     Conjunctiva/sclera: Conjunctivae normal.     Pupils: Pupils are equal, round, and reactive to light.  Cardiovascular:     Rate and Rhythm: Normal rate and regular rhythm.     Heart sounds: No murmur heard. Pulmonary:     Effort: Pulmonary effort is normal. No respiratory distress.     Breath sounds: Normal breath sounds. No wheezing or rales.  Musculoskeletal:     Cervical back: Normal range of motion and neck supple.  Skin:    General: Skin is warm and dry.     Capillary Refill: Capillary refill takes less than 2 seconds.  Neurological:     General: No focal deficit present.     Mental Status: She is alert and oriented to person, place, and time.  Mental status is at baseline.  Psychiatric:        Mood and Affect: Mood normal.        Behavior: Behavior normal.        Thought Content: Thought content normal.        Judgment: Judgment normal.    Results for orders placed or performed in visit on 11/11/21  Urinalysis, Routine w reflex microscopic  Result Value Ref Range   Specific Gravity, UA 1.020 1.005 - 1.030   pH, UA 5.5 5.0 - 7.5   Color, UA Yellow Yellow   Appearance Ur Clear Clear   Leukocytes,UA Negative Negative   Protein,UA Negative Negative/Trace   Glucose, UA Negative Negative   Ketones, UA Negative Negative   RBC, UA Negative Negative   Bilirubin, UA Negative Negative   Urobilinogen, Ur 0.2 0.2 - 1.0 mg/dL   Nitrite, UA Negative Negative      Assessment & Plan:   Problem List Items Addressed This Visit       Nervous and Auditory   Alzheimer's dementia (HCC) - Primary    Chronic. Continues to progress.  Patient saw Neurology who recommends Mirtazipine. Will start today.  Discussed side effects and  benefits with son at visit today. Patient has lost about 14lbs since last visit 1 month ago.  Discussed boost or ensure protein drinks to help with nutrition.  Follow up in 1 month for reevaluation.  Patient is followed by [facility] and [facility].  Will continue with these services as they are helping her son care for her.       Relevant Medications   mirtazapine (REMERON) 7.5 MG tablet   Other Relevant Orders   Urinalysis, Routine w reflex microscopic     Other   Anxiety state    Chronic. Uses Xanax PRN when patient is very anxious and he isn't able to calm her down. She does sleep well at night and the Zoloft helps with her anxiety on a regular basis.  Follow up in 1 month for reevaluation.       Relevant Medications   mirtazapine (REMERON) 7.5 MG tablet   Mild single current episode of major depressive disorder (HCC)    Chronic. Uses Xanax PRN when patient is very anxious and he isn't able to calm her down. She does sleep well at night and the Zoloft helps with her anxiety on a regular basis.  Follow up in 1 month for reevaluation.       Relevant Medications   mirtazapine (REMERON) 7.5 MG tablet   Colostomy in place [facility])    Chronic. Has not established with a gastroenterologist.  Referral placed today.       Other Visit Diagnoses     History of creation of ostomy [facility])   (Chronic)     Relevant Orders   Ambulatory referral to Gastroenterology        Follow up plan: Return in about 1 month (around 01/01/2022) for Weight check and Medication follow up.

## 2021-12-04 NOTE — Assessment & Plan Note (Signed)
Chronic. Uses Xanax PRN when patient is very anxious and he isn't able to calm her down. She does sleep well at night and the Zoloft helps with her anxiety on a regular basis.  Follow up in 1 month for reevaluation.  °

## 2021-12-04 NOTE — Assessment & Plan Note (Addendum)
Chronic. Continues to progress.  Patient saw Neurology who recommends Mirtazipine. Will start today.  Discussed side effects and benefits with son at visit today. Patient has lost about 14lbs since last visit 1 month ago.  Discussed boost or ensure protein drinks to help with nutrition.  Follow up in 1 month for reevaluation.  Patient is followed by [facility] and [facility].  Will continue with these services as they are helping her son care for her.

## 2021-12-07 ENCOUNTER — Other Ambulatory Visit: Payer: Medicare PPO

## 2021-12-07 DIAGNOSIS — N390 Urinary tract infection, site not specified: Secondary | ICD-10-CM | POA: Diagnosis not present

## 2021-12-07 DIAGNOSIS — G309 Alzheimer's disease, unspecified: Secondary | ICD-10-CM

## 2021-12-07 DIAGNOSIS — F0284 Dementia in other diseases classified elsewhere, unspecified severity, with anxiety: Secondary | ICD-10-CM

## 2021-12-07 LAB — MICROSCOPIC EXAMINATION: RBC, Urine: NONE SEEN /hpf (ref 0–2)

## 2021-12-07 LAB — URINALYSIS, ROUTINE W REFLEX MICROSCOPIC
Bilirubin, UA: NEGATIVE
Glucose, UA: NEGATIVE
Ketones, UA: NEGATIVE
Nitrite, UA: NEGATIVE
RBC, UA: NEGATIVE
Specific Gravity, UA: 1.01 (ref 1.005–1.030)
Urobilinogen, Ur: 0.2 mg/dL (ref 0.2–1.0)
pH, UA: >9 — ABNORMAL HIGH (ref 5.0–7.5)

## 2021-12-07 NOTE — Progress Notes (Signed)
Please let patient's son know that her urine is abnormal. I have sent it for culture to evaluate the need for antibiotics.  I will follow up with him once that is back.

## 2021-12-08 ENCOUNTER — Telehealth: Payer: Self-pay

## 2021-12-08 NOTE — Telephone Encounter (Signed)
CALLED PATIENT NO ANSWER LEFT VOICEMAIL FOR A CALL BACK ? ?

## 2021-12-09 ENCOUNTER — Telehealth: Payer: Self-pay

## 2021-12-09 NOTE — Telephone Encounter (Signed)
CALLED PATIENT NO ANSWER LEFT VOICEMAIL FOR A CALL BACK °Letter sent °

## 2021-12-09 NOTE — Telephone Encounter (Signed)
Patient called back and I got her scheduled ?

## 2021-12-10 ENCOUNTER — Encounter: Payer: Self-pay | Admitting: Nurse Practitioner

## 2021-12-10 LAB — URINE CULTURE

## 2021-12-11 MED ORDER — AMOXICILLIN 500 MG PO CAPS: 500.0000 mg | ORAL_CAPSULE | Freq: Two times a day (BID) | ORAL | 0 refills | Status: DC

## 2021-12-11 NOTE — Progress Notes (Signed)
Please let patient's son justin know that her urine grew a bacteria again.  I have sent amoxicillin to the pharmacy to treat this.  Please let me know if they have any questions.  We will recheck her urine at her next visit.

## 2021-12-14 MED ORDER — NITROFURANTOIN MONOHYD MACRO 100 MG PO CAPS: 100.0000 mg | ORAL_CAPSULE | Freq: Two times a day (BID) | ORAL | 0 refills | Status: DC

## 2021-12-15 ENCOUNTER — Ambulatory Visit: Payer: Self-pay | Admitting: *Deleted

## 2021-12-15 NOTE — Telephone Encounter (Signed)
Chief Complaint: Pill too big to swallow Symptoms: N/A Frequency: N/A Pertinent Negatives: Patient denies N/A Disposition: [] ED /[] Urgent Care (no appt availability in office) / [] Appointment(In office/virtual)/ []  [facility]/ [] Home Care/ [] Refused Recommended Disposition /[] [place]/ [x]  Follow-up with PCP Additional Notes:  Patient's daughter called and she says her mother is not able to swallow those big antibiotic pills Macrobid prescribed. She asks for a liquid antibiotic instead. I advised I will send this to the provider.   Summary: liquid medication requested patient cant swallow   Katherine Wood patient daughter called in to inform Katherine Wood that medication  nitrofurantoin, macrocrystal-monohydrate, (MACROBID) 100 MG capsule sent in on 12/14/21 are still too big and patient can not swallow them. Per paula patient is trying to chew these pills and is not able to get it down so asking if there is an antibiotic in liquid form that can be sent for patient to take. Asking for a call back with an answer please at Ph# [phone]      Reason for Disposition  [1] Caller has NON-URGENT medicine question about med that PCP prescribed AND [2] triager unable to answer question  Answer Assessment - Initial Assessment Questions 1. NAME of MEDICATION: "What medicine are you calling about?"     Macrobid 2. QUESTION: "What is your question?" (e.g., double dose of medicine, side effect)     I need a liquid antibiotic 3. PRESCRIBING HCP: "Who prescribed it?" Reason: if prescribed by specialist, call should be referred to that group.      Katherine Wood 4. SYMPTOMS: "Do you have any symptoms?"     N/A 5. SEVERITY: If symptoms are present, ask "Are they mild, moderate or severe?"     N/A 6. PREGNANCY:  "Is there any chance that you are pregnant?" "When was your last menstrual period?"     N/A  Protocols used: Medication Question Call-A-AH

## 2021-12-15 NOTE — Telephone Encounter (Signed)
Katherine Wood called in.   I returned her call.  She wanted to let [name], NP know that her mother can't swallow the Macrobid 100 mg prescribed on 12/14/2021.  They are too big.   Pt is trying to chew them and still can't swallow them.  Is there an antibiotic that comes in a liquid form?   [name] would like a call back [phone].  I left a voicemail to call back.

## 2021-12-16 MED ORDER — NITROFURANTOIN 25 MG/5ML PO SUSP: 100.0000 mg | Freq: Two times a day (BID) | ORAL | 0 refills | Status: DC

## 2021-12-16 NOTE — Telephone Encounter (Signed)
Patient son aware

## 2021-12-16 NOTE — Telephone Encounter (Signed)
Please let patient's daughter know that I have sent the medication to [facility] in liquid.

## 2021-12-21 ENCOUNTER — Ambulatory Visit (INDEPENDENT_AMBULATORY_CARE_PROVIDER_SITE_OTHER): Payer: Medicare PPO | Admitting: Licensed Clinical Social Worker

## 2021-12-21 DIAGNOSIS — F320 Major depressive disorder, single episode, mild: Secondary | ICD-10-CM

## 2021-12-21 DIAGNOSIS — F411 Generalized anxiety disorder: Secondary | ICD-10-CM

## 2021-12-21 DIAGNOSIS — G309 Alzheimer's disease, unspecified: Secondary | ICD-10-CM

## 2021-12-21 NOTE — Patient Instructions (Signed)
Visit Information  Thank you for taking time to visit with me today. Please don't hesitate to contact me if I can be of assistance to you before our next scheduled telephone appointment.  Following are the goals we discussed today:  Patient Goals/Self-Care Activities: Over the next 120 days Attend scheduled appts with providers Utilize healthy coping skills and/or resources discussed Contact PCP office with any questions or concerns  Our next appointment is by telephone on 03/01/22 at 2:15 PM  Please call the care guide team at [phone] if you need to cancel or reschedule your appointment.   If you are experiencing a Mental Health or Behavioral Health Crisis or need someone to talk to, please call the Suicide and Crisis Lifeline: 988 call 911   Patient verbalizes understanding of instructions and care plan provided today and agrees to view in MyChart. Active MyChart status confirmed with patient.    Jenel Lucks, MSW, LCSW [facility] [place]   [email or web address] Phone [phone] 1:54 PM

## 2021-12-21 NOTE — Chronic Care Management (AMB) (Signed)
Chronic Care Management    Clinical Social Work Note  12/21/2021 Name: Katherine Wood MRN: [id number] DOB: May 19, 1941  Katherine Wood is a 81 y.o. year old female who is a primary care patient of Jon Billings, NP. The CCM team was consulted to assist the patient with chronic disease management and/or care coordination needs related to: [employer] , Level of Care Concerns, and Mental Health Counseling and Resources.   Engaged with patient's son by telephone for follow up visit in response to provider referral for social work chronic care management and care coordination services.   Consent to Services:  The patient was given information about Chronic Care Management services, agreed to services, and gave verbal consent prior to initiation of services.  Please see initial visit note for detailed documentation.   Patient agreed to services and consent obtained.   Assessment: Review of patient past medical history, allergies, medications, and health status, including review of relevant consultants reports was performed today as part of a comprehensive evaluation and provision of chronic care management and care coordination services.     SDOH (Social Determinants of Health) assessments and interventions performed:    Advanced Directives Status: Not addressed in this encounter.  CCM Care Plan  Allergies  Allergen Reactions   Alendronate Sodium Other (See Comments)    Joint pains Joint pains   Galantamine Other (See Comments)    Outpatient Encounter Medications as of 12/21/2021  Medication Sig   ALPRAZolam (XANAX) 0.25 MG tablet Take 1 tablet (0.25 mg total) by mouth 2 (two) times daily as needed for anxiety.   Calcium Carbonate (CALCIUM 600 PO) Take 900 mg by mouth daily.   Cholecalciferol (VITAMIN D3) 5000 units CAPS Take 5,000 Units by mouth daily.   ferrous sulfate 325 (65 FE) MG EC tablet Take 325 mg by mouth daily.   mirtazapine (REMERON) 7.5 MG tablet Take  1 tablet (7.5 mg total) by mouth at bedtime.   nitrofurantoin (FURADANTIN) 25 MG/5ML suspension Take 20 mLs (100 mg total) by mouth 2 (two) times daily for 7 days.   sertraline (ZOLOFT) 50 MG tablet Take 1 tablet (50 mg total) by mouth daily.   vitamin E 400 UNIT capsule Take 1,000 Units by mouth daily.   No facility-administered encounter medications on file as of 12/21/2021.    Patient Active Problem List   Diagnosis Date Noted   Colostomy in place [facility])    Acute urinary tract infection 10/21/2021   Allergy    Mild single current episode of major depressive disorder ([place]) 12/07/2016   Anemia 04/07/2016   Diverticulitis large intestine 02/25/2016   Hernia of abdominal cavity    Alzheimer's dementia ([place]) 07/27/2015   Memory loss 05/16/2015   Narrowing of intervertebral disc space 02/13/2015   Hypercholesteremia 02/13/2015   Symptomatic menopausal or female climacteric states 02/13/2015   Arthritis, degenerative 02/13/2015   Vitamin D deficiency 02/13/2015   Connective tissue and disc stenosis of intervertebral foramina of abdomen and other regions 02/13/2015   Anxiety state 10/31/2008   Arthropathia 10/30/2008   Leg varices 10/30/2008   Acquired cyst of kidney 10/30/2008   Insomnia 10/30/2008   OP (osteoporosis) 10/30/2008   Cystocele, midline 10/30/2008    Conditions to be addressed/monitored: Anxiety, Depression, and Dementia  Care Plan : LCSW Plan of Care  Updates made by Rebekah Chesterfield, LCSW since 12/21/2021 12:00 AM     Problem: Quality of Life (General Plan of Care)      Long-Range Goal: Quality  of Life Maintained   Start Date: 10/30/2021  This Visit's Progress: On track  Recent Progress: On track  Priority: High  Note:   Current barriers:   Severe Persistent Mental Health needs related to Alzheimer's Dementia, Depression, and Anxiety Memory Deficits, Inability to perform ADL's independently, and Inability to perform IADL's independently Needs Support,  Education, and Care Coordination in order to meet unmet mental health needs. Clinical Goal(s): demonstrate a reduction in symptoms related to :Depression: anxiety   Clinical Interventions:  Assessed patient's previous and current treatment, coping skills, support system and barriers to care. All hx was provided by patient's adult children, Larkin Ina and Nevin Bloodgood Both were informed that they would have to speak with CCM LCSW prior to a referral to a neurologist to assess Alzheimer's Dementia. LCSW shared that they may have needed a referral from PCP prior to scheduling an appointment with a specialist. LCSW collaborated with PCP regarding family's request to a Neurology and palliative care referral 2/27: Patient's son shared neurology appts have gon well and pt had rapport with staff. Neurologist clarified pt's conditions and discussed appropriate tx plan. Family are appreciative for palliative care and was provided a lot of supportive information. They were researching PACE program; however, is not interested in changing pt's providers. CCM LCSW will send listing of local day centers for family to look into  Patient receives strong support from family, which includes three caregivers. They have attended various caregiver seminars and obtained resources. Both were successful in identifying healthy coping skills to assist with stress Patient's bladder infection has improved and her appetite has returned. Larkin Ina has picked up meds this morning LCSW discussed differences between hospice vs palliative care. Family are interested in a palliative care referral. CCM LCSW will send info on [facility], in addition, to caregiver support resources to e-mail provided Active listening / Reflection utilized  Emotional Support Provided Problem [place] strategies reviewed Provided psychoeducation for mental health needs  Caregiver stress acknowledged  Increase in actives / exercise encouraged   Verbalization of feelings encouraged  ; Review resources, discussed options and provided patient information about  Dementia resources and support -Patient is participating in in home services physical therapy. He feels like it has been beneficial and will discuss with medical team extending services, if needed 1:1 collaboration with primary care provider regarding development and update of comprehensive plan of care as evidenced by provider attestation and co-signature Inter-disciplinary care team collaboration (see longitudinal plan of care) Patient Goals/Self-Care Activities: Over the next 120 days Attend scheduled appts with providers Utilize healthy coping skills and/or resources discussed Contact PCP office with any questions or concerns       Christa See, MSW, [email or web address] Phone [phone] 1:57 PM

## 2021-12-22 ENCOUNTER — Telehealth: Payer: Self-pay | Admitting: Nurse Practitioner

## 2021-12-22 DIAGNOSIS — G309 Alzheimer's disease, unspecified: Secondary | ICD-10-CM

## 2021-12-22 DIAGNOSIS — F320 Major depressive disorder, single episode, mild: Secondary | ICD-10-CM

## 2021-12-22 DIAGNOSIS — F0284 Dementia in other diseases classified elsewhere, unspecified severity, with anxiety: Secondary | ICD-10-CM

## 2021-12-22 MED ORDER — NITROFURANTOIN 25 MG/5ML PO SUSP: 100.0000 mg | Freq: Two times a day (BID) | ORAL | 0 refills | Status: DC

## 2021-12-22 NOTE — Telephone Encounter (Signed)
Daughter called demanding that the liquid antibiotic be called into the pharmacy today / she stated she spoke with someone and was advised it was sent to pharmacy on 2.22.23/ please advise asap

## 2021-12-22 NOTE — Telephone Encounter (Signed)
Spoke with patient's daughter regarding medication concerns. Verified medication has been called into [facility].

## 2021-12-22 NOTE — Telephone Encounter (Addendum)
Pharmacy called in to confirm they have received the medication refill request.

## 2021-12-22 NOTE — Telephone Encounter (Signed)
I advised pt that Katherine Wood would check on this with Clydie Braun and give her call back and the daughter starting yelling and using profanity while speaking at me and demanding to speak with someone

## 2021-12-22 NOTE — Telephone Encounter (Signed)
Medication was sent to [facility] pharmacy on [address]. Called daughter to let her know and had to leave a vm.

## 2021-12-23 ENCOUNTER — Ambulatory Visit: Payer: Self-pay | Admitting: *Deleted

## 2021-12-23 ENCOUNTER — Inpatient Hospital Stay
Admission: EM | Admit: 2021-12-23 | Discharge: 2021-12-26 | DRG: 391 | Disposition: A | Discharge status: Hospice | Payer: Medicare PPO | Attending: Internal Medicine | Admitting: Internal Medicine

## 2021-12-23 ENCOUNTER — Emergency Department: Payer: Medicare PPO

## 2021-12-23 ENCOUNTER — Other Ambulatory Visit: Payer: Self-pay

## 2021-12-23 ENCOUNTER — Encounter: Payer: Self-pay | Admitting: Emergency Medicine

## 2021-12-23 DIAGNOSIS — Z933 Colostomy status: Secondary | ICD-10-CM

## 2021-12-23 DIAGNOSIS — Z888 Allergy status to other drugs, medicaments and biological substances status: Secondary | ICD-10-CM | POA: Diagnosis not present

## 2021-12-23 DIAGNOSIS — E43 Unspecified severe protein-calorie malnutrition: Secondary | ICD-10-CM | POA: Diagnosis present

## 2021-12-23 DIAGNOSIS — Z515 Encounter for palliative care: Secondary | ICD-10-CM

## 2021-12-23 DIAGNOSIS — E872 Acidosis, unspecified: Secondary | ICD-10-CM | POA: Diagnosis present

## 2021-12-23 DIAGNOSIS — F0280 Dementia in other diseases classified elsewhere without behavioral disturbance: Secondary | ICD-10-CM | POA: Diagnosis present

## 2021-12-23 DIAGNOSIS — I700 Atherosclerosis of aorta: Secondary | ICD-10-CM | POA: Diagnosis not present

## 2021-12-23 DIAGNOSIS — Z66 Do not resuscitate: Secondary | ICD-10-CM | POA: Diagnosis present

## 2021-12-23 DIAGNOSIS — Z87440 Personal history of urinary (tract) infections: Secondary | ICD-10-CM | POA: Diagnosis not present

## 2021-12-23 DIAGNOSIS — Z79899 Other long term (current) drug therapy: Secondary | ICD-10-CM

## 2021-12-23 DIAGNOSIS — R109 Unspecified abdominal pain: Secondary | ICD-10-CM | POA: Diagnosis not present

## 2021-12-23 DIAGNOSIS — K8020 Calculus of gallbladder without cholecystitis without obstruction: Secondary | ICD-10-CM | POA: Diagnosis present

## 2021-12-23 DIAGNOSIS — D649 Anemia, unspecified: Secondary | ICD-10-CM | POA: Diagnosis present

## 2021-12-23 DIAGNOSIS — R000 Tachycardia, unspecified: Secondary | ICD-10-CM | POA: Diagnosis not present

## 2021-12-23 DIAGNOSIS — Z90710 Acquired absence of both cervix and uterus: Secondary | ICD-10-CM

## 2021-12-23 DIAGNOSIS — I10 Essential (primary) hypertension: Secondary | ICD-10-CM | POA: Diagnosis present

## 2021-12-23 DIAGNOSIS — Z9049 Acquired absence of other specified parts of digestive tract: Secondary | ICD-10-CM

## 2021-12-23 DIAGNOSIS — Z6821 Body mass index (BMI) 21.0-21.9, adult: Secondary | ICD-10-CM | POA: Diagnosis not present

## 2021-12-23 DIAGNOSIS — Z9841 Cataract extraction status, right eye: Secondary | ICD-10-CM | POA: Diagnosis not present

## 2021-12-23 DIAGNOSIS — Z8249 Family history of ischemic heart disease and other diseases of the circulatory system: Secondary | ICD-10-CM | POA: Diagnosis not present

## 2021-12-23 DIAGNOSIS — Z9842 Cataract extraction status, left eye: Secondary | ICD-10-CM

## 2021-12-23 DIAGNOSIS — R197 Diarrhea, unspecified: Secondary | ICD-10-CM | POA: Diagnosis present

## 2021-12-23 DIAGNOSIS — F0390 Unspecified dementia without behavioral disturbance: Secondary | ICD-10-CM | POA: Diagnosis present

## 2021-12-23 DIAGNOSIS — E876 Hypokalemia: Secondary | ICD-10-CM | POA: Diagnosis present

## 2021-12-23 DIAGNOSIS — Z20822 Contact with and (suspected) exposure to covid-19: Secondary | ICD-10-CM | POA: Diagnosis present

## 2021-12-23 DIAGNOSIS — N390 Urinary tract infection, site not specified: Secondary | ICD-10-CM

## 2021-12-23 DIAGNOSIS — Z7189 Other specified counseling: Secondary | ICD-10-CM

## 2021-12-23 DIAGNOSIS — R627 Adult failure to thrive: Secondary | ICD-10-CM | POA: Diagnosis present

## 2021-12-23 DIAGNOSIS — R5381 Other malaise: Secondary | ICD-10-CM | POA: Diagnosis not present

## 2021-12-23 LAB — RESP PANEL BY RT-PCR (FLU A&B, COVID) ARPGX2
Influenza A by PCR: NEGATIVE
Influenza B by PCR: NEGATIVE
SARS Coronavirus 2 by RT PCR: NEGATIVE

## 2021-12-23 LAB — CBC WITH DIFFERENTIAL/PLATELET
Abs Immature Granulocytes: 0.12 10*3/uL — ABNORMAL HIGH (ref 0.00–0.07)
Basophils Absolute: 0.1 10*3/uL (ref 0.0–0.1)
Basophils Relative: 1 %
Eosinophils Absolute: 0.1 10*3/uL (ref 0.0–0.5)
Eosinophils Relative: 1 %
HCT: 26.8 % — ABNORMAL LOW (ref 36.0–46.0)
Hemoglobin: 8.9 g/dL — ABNORMAL LOW (ref 12.0–15.0)
Immature Granulocytes: 1 %
Lymphocytes Relative: 11 %
Lymphs Abs: 1 10*3/uL (ref 0.7–4.0)
MCH: 30.8 pg (ref 26.0–34.0)
MCHC: 33.2 g/dL (ref 30.0–36.0)
MCV: 92.7 fL (ref 80.0–100.0)
Monocytes Absolute: 0.8 10*3/uL (ref 0.1–1.0)
Monocytes Relative: 9 %
Neutro Abs: 6.8 10*3/uL (ref 1.7–7.7)
Neutrophils Relative %: 77 %
Platelets: 431 10*3/uL — ABNORMAL HIGH (ref 150–400)
RBC: 2.89 MIL/uL — ABNORMAL LOW (ref 3.87–5.11)
RDW: 16.2 % — ABNORMAL HIGH (ref 11.5–15.5)
WBC: 8.8 10*3/uL (ref 4.0–10.5)
nRBC: 0.5 % — ABNORMAL HIGH (ref 0.0–0.2)

## 2021-12-23 LAB — COMPREHENSIVE METABOLIC PANEL
ALT: 9 U/L (ref 0–44)
AST: 18 U/L (ref 15–41)
Albumin: 3.3 g/dL — ABNORMAL LOW (ref 3.5–5.0)
Alkaline Phosphatase: 64 U/L (ref 38–126)
Anion gap: 17 — ABNORMAL HIGH (ref 5–15)
BUN: 21 mg/dL (ref 8–23)
CO2: 14 mmol/L — ABNORMAL LOW (ref 22–32)
Calcium: 9.4 mg/dL (ref 8.9–10.3)
Chloride: 105 mmol/L (ref 98–111)
Creatinine, Ser: 1.01 mg/dL — ABNORMAL HIGH (ref 0.44–1.00)
GFR, Estimated: 57 mL/min — ABNORMAL LOW (ref >60)
Glucose, Bld: 105 mg/dL — ABNORMAL HIGH (ref 70–99)
Potassium: 2.7 mmol/L — CL (ref 3.5–5.1)
Sodium: 136 mmol/L (ref 135–145)
Total Bilirubin: 0.6 mg/dL (ref 0.3–1.2)
Total Protein: 7.2 g/dL (ref 6.5–8.1)

## 2021-12-23 LAB — URINALYSIS, ROUTINE W REFLEX MICROSCOPIC
Bilirubin Urine: NEGATIVE
Glucose, UA: NEGATIVE mg/dL
Hgb urine dipstick: NEGATIVE
Ketones, ur: NEGATIVE mg/dL
Nitrite: NEGATIVE
Protein, ur: NEGATIVE mg/dL
Specific Gravity, Urine: 1.017 (ref 1.005–1.030)
pH: 5 (ref 5.0–8.0)

## 2021-12-23 LAB — LIPASE, BLOOD: Lipase: 38 U/L (ref 11–51)

## 2021-12-23 LAB — TROPONIN I (HIGH SENSITIVITY): Troponin I (High Sensitivity): 10 ng/L (ref <18)

## 2021-12-23 MED ORDER — SODIUM CHLORIDE 0.9 % IV BOLUS
500.0000 mL | Freq: Once | INTRAVENOUS | Status: AC
  Administered 2021-12-23: 500 mL via INTRAVENOUS

## 2021-12-23 MED ORDER — POTASSIUM CHLORIDE 10 MEQ/100ML IV SOLN
10.0000 meq | Freq: Once | INTRAVENOUS | Status: AC
  Administered 2021-12-23: 10 meq via INTRAVENOUS
  Filled 2021-12-23 (×2): qty 100

## 2021-12-23 MED ORDER — POTASSIUM CHLORIDE 10 MEQ/100ML IV SOLN
10.0000 meq | Freq: Once | INTRAVENOUS | Status: AC
  Administered 2021-12-23: 10 meq via INTRAVENOUS

## 2021-12-23 MED ORDER — IOHEXOL 300 MG/ML  SOLN
75.0000 mL | Freq: Once | INTRAMUSCULAR | Status: AC | PRN
  Administered 2021-12-23: 75 mL via INTRAVENOUS

## 2021-12-23 NOTE — ED Notes (Signed)
Purewick applied, pt tolerated well. ?

## 2021-12-23 NOTE — Telephone Encounter (Signed)
?  Chief Complaint: Diarrhea ?Symptoms:     At least 4 episodes of watery diarrhea daily since 12/14/21, started Nitrofurantoin 12/14/21, urine dark, "Not eating at all."  ?Nurse states "Not getting up and around as usual." Weak. ?Frequency: Onset 12/14/21 ?Pertinent Negatives: Patient denies  ?Disposition: [x] ED /[] Urgent Care (no appt availability in office) / [] Appointment(In office/virtual)/ []  [place]/ [] Home Care/ [] Refused Recommended Disposition /[] [facility]/ []  Follow-up with PCP ?Additional Notes: Advised ED, states will follow disposition.   ?Information from [facility] Nurse 'Precious' Pt's son also providing info. Pt with Alzheimers.    ? ?Reason for Disposition ? Patient sounds very sick or weak to the triager ? ?Answer Assessment - Initial Assessment Questions ?1. ANTIBIOTIC: "What antibiotic are you taking?" "How many times per day?" ?    Nitrofurantoin. ?2. ANTIBIOTIC ONSET: "When was the antibiotic started?" ?    12/14/21 ?3. DIARRHEA SEVERITY: "How bad is the diarrhea?" "How many more stools have you had in the past 24 hours than normal?"  ?  - NO DIARRHEA (SCALE 0) ?  - MILD (SCALE 1-3): Few loose or mushy BMs; increase of 1-3 stools over normal daily number of stools; mild increase in ostomy output. ?  -  MODERATE (SCALE 4-7): Increase of 4-6 stools daily over normal; moderate increase in ostomy output. ?* SEVERE (SCALE 8-10; OR 'WORST POSSIBLE'): Increase of 7 or more stools daily over normal; moderate increase in ostomy output; incontinence. ?    4 ?4. ONSET: "When did the diarrhea begin?"  ?    12/14/21 ?5. BM CONSISTENCY: "How loose or watery is the diarrhea?"  ?    watery ?6. VOMITING: "Are you also vomiting?" If Yes, ask: "How many times in the past 24 hours?"  ?    no ?7. ABDOMINAL PAIN: "Are you having any abdominal pain?" If Yes, ask: "What does it feel like?" (e.g., crampy, dull, intermittent, constant)  ?    Unsure ?8. ABDOMINAL PAIN SEVERITY: If present, ask:  "How bad is the pain?"  (e.g., Scale 1-10; mild, moderate, or severe) ?  - MILD (1-3): doesn't interfere with normal activities, abdomen soft and not tender to touch  ?  - MODERATE (4-7): interferes with normal activities or awakens from sleep, abdomen tender to touch  ?  - SEVERE (8-10): excruciating pain, doubled over, unable to do any normal activities   ?    Unsure ?9. ORAL INTAKE: If vomiting, "Have you been able to drink liquids?" "How much liquids have you had in the past 24 hours?" ?    1 liter in 24hrs. ?10. HYDRATION: "Any signs of dehydration?" (e.g., dry mouth [not just dry lips], too weak to stand, dizziness, new weight loss) "When did you last urinate?" ?   Urine darker, weaker ? ?12. OTHER SYMPTOMS: "Do you have any other symptoms?" (e.g., fever, blood in stool) ? ?Protocols used: Diarrhea on Antibiotics-A-AH ? ?

## 2021-12-23 NOTE — ED Notes (Signed)
Family members x2 at bedside, state they were told by home health to bring pt to ED. Pt with diarrhea x3 days, tolerating PO fluids, but not food well. Family also states pt has hx of recurrent UTI. ?

## 2021-12-23 NOTE — ED Triage Notes (Signed)
Patient to ED via [facility] from home for diarrhea x3 days. Patient has also not been eating during this time. Hx of dementia. ?

## 2021-12-23 NOTE — ED Notes (Signed)
Patient returned from CT via stretcher with CT tech

## 2021-12-23 NOTE — ED Notes (Signed)
Patient transported to CT via stretcher with CT tech. °

## 2021-12-23 NOTE — ED Provider Notes (Signed)
? ?[facility] ?Provider Note ? ? ? Event Date/Time  ? First MD Initiated Contact with Patient 12/23/21 1818   ?  (approximate) ? ? ?History  ? ?Diarrhea ? ? ?HPI ? ?Level 5 caveat: History of present illness limited due to dementia ? ?Katherine Wood is a 81 y.o. female with history of dementia who presents with diarrhea over the last 3 days and decreased appetite.  The patient herself is unable to give any history.  She denies any abdominal or other acute pain at this time. ? ? ?Physical Exam  ? ?Triage Vital Signs: ?ED Triage Vitals  ?Enc Vitals Group  ?   BP 12/23/21 1813 126/74  ?   Pulse Rate 12/23/21 1813 96  ?   Resp 12/23/21 1813 18  ?   Temp 12/23/21 1813 98.2 ?F (36.8 ?C)  ?   Temp Source 12/23/21 1813 Axillary  ?   SpO2 12/23/21 1809 96 %  ?   Weight 12/23/21 1811 119 lb 11.4 oz (54.3 kg)  ?   Height 12/23/21 1811 5\' 3"  (1.6 m)  ?   Head Circumference --   ?   Peak Flow --   ?   Pain Score 12/23/21 1811 0  ?   Pain Loc --   ?   Pain Edu? --   ?   Excl. in GC? --   ? ? ?Most recent vital signs: ?Vitals:  ? 12/23/21 2230 12/23/21 2300  ?BP: 120/78 132/87  ?Pulse:  76  ?Resp: 16 20  ?Temp:    ?SpO2:  98%  ? ? ? ?General: Alert, confused.  Anxious appearing. ?CV:  Good peripheral perfusion.  ?Resp:  Normal effort.  ?Abd:  Soft and nontender.  No distention.  ?Other:  Mucous membranes slightly dry.  No scleral icterus or jaundice. ? ? ?ED Results / Procedures / Treatments  ? ?Labs ?(all labs ordered are listed, but only abnormal results are displayed) ?Labs Reviewed  ?CBC WITH DIFFERENTIAL/PLATELET - Abnormal; Notable for the following components:  ?    Result Value  ? RBC 2.89 (*)   ? Hemoglobin 8.9 (*)   ? HCT 26.8 (*)   ? RDW 16.2 (*)   ? Platelets 431 (*)   ? nRBC 0.5 (*)   ? Abs Immature Granulocytes 0.12 (*)   ? All other components within normal limits  ?COMPREHENSIVE METABOLIC PANEL - Abnormal; Notable for the following components:  ? Potassium 2.7 (*)   ? CO2 14 (*)   ?  Glucose, Bld 105 (*)   ? Creatinine, Ser 1.01 (*)   ? Albumin 3.3 (*)   ? GFR, Estimated 57 (*)   ? Anion gap 17 (*)   ? All other components within normal limits  ?URINALYSIS, ROUTINE W REFLEX MICROSCOPIC - Abnormal; Notable for the following components:  ? Color, Urine YELLOW (*)   ? APPearance CLOUDY (*)   ? Leukocytes,Ua LARGE (*)   ? Bacteria, UA MANY (*)   ? All other components within normal limits  ?RESP PANEL BY RT-PCR (FLU A&B, COVID) ARPGX2  ?LIPASE, BLOOD  ?TROPONIN I (HIGH SENSITIVITY)  ? ? ? ?EKG ? ?ED ECG REPORT ?I05/01/23, the attending physician, personally viewed and interpreted this ECG. ? ?Date: 12/23/2021 ?EKG Time: 1818 ?Rate: 104 ?Rhythm: Sinus rhythm with PVCs ?QRS Axis: normal ?Intervals: Nonspecific IVCD ?ST/T Wave abnormalities: Nonspecific abnormalities ?Narrative Interpretation: Nonspecific abnormalities with no evidence of acute ischemia; interpretation limited due to poor EKG  baseline ? ? ?RADIOLOGY ? ? ? ?PROCEDURES: ? ?Critical Care performed: No ? ?Procedures ? ? ?MEDICATIONS ORDERED IN ED: ?Medications  ?potassium chloride 10 mEq in 100 mL IVPB (10 mEq Intravenous New Bag/Given 12/23/21 2301)  ?sodium chloride 0.9 % bolus 500 mL (0 mLs Intravenous Stopped 12/23/21 2014)  ?sodium chloride 0.9 % bolus 500 mL (0 mLs Intravenous Stopped 12/23/21 2301)  ?potassium chloride 10 mEq in 100 mL IVPB (0 mEq Intravenous Stopped 12/23/21 2301)  ?iohexol (OMNIPAQUE) 300 MG/ML solution 75 mL (75 mLs Intravenous Contrast Given 12/23/21 2127)  ? ? ? ?IMPRESSION / MDM / ASSESSMENT AND PLAN / ED COURSE  ?I reviewed the triage vital signs and the nursing notes. ? ?81 year old female with PMH of dementia, hyperlipidemia, and osteoporosis, currently DNR, presents with diarrhea and decreased p.o. intake for the last several days.  The patient is unable to give any relevant history and there is no family with her on my initial evaluation. ? ?On exam the patient is alert but confused.  Physical exam is  otherwise unremarkable.  Neurologic exam is nonfocal.  The abdomen is soft and nontender. ? ?Differential diagnosis includes, but is not limited to, gastroenteritis, colitis, diverticulitis, electrolyte abnormality, AKI, other metabolic etiology, or less likely cardiac cause.  We will obtain lab work-up, give a fluid bolus, and reassess.  Based on further discussion with the family the patient may need additional work-up. ? ?----------------------------------------- ?11:07 PM on 12/23/2021 ?----------------------------------------- ? ?Initial lab work-up was significant for hypokalemia but otherwise unremarkable.  Subsequently urinalysis reveals findings of likely UTI. ? ?I discussed further with the patient's son and caretaker.  They state that the patient has had an overall decline in terms of her dementia and mental status over the last several weeks.  She has been diagnosed with a UTI but has difficulty complying with swallowing pills.  She was most recently on nitrofurantoin a couple of weeks ago but was not really taking it. ? ?Based on discussion with them, planned to replete the potassium, give additional hydration, and obtain a CT given the diarrhea.  The CT is negative for acute findings.  Radiology impression is as follows: ? ?IMPRESSION: ?1. Cholelithiasis. ?2. Partial colectomy and Hartmann pouch formation with a left lower ?descending end colostomy. ?3. Hysterectomy. ?4. Aortic Atherosclerosis (ICD10-I70.0).  ? ?I discussed with the family to potentially hydrate, replete potassium, and discharge the patient if labs improve, versus potential admission.  The family has stepped out and I tried to reach the son by phone unsuccessfully.  I signed the patient out to the oncoming ED physician Dr. Elesa Massed with the plan to discuss the disposition with the family.  If repeat labs are improved patient could potentially be sent home with a prescription for liquid nitrofurantoin. ? ? ? ?FINAL CLINICAL IMPRESSION(S) /  ED DIAGNOSES  ? ?Final diagnoses:  ?Diarrhea, unspecified type  ?Hypokalemia  ?Urinary tract infection without hematuria, site unspecified  ? ? ? ?Rx / DC Orders  ? ?ED Discharge Orders   ? ? None  ? ?  ? ? ? ?Note:  This document was prepared using Dragon voice recognition software and may include unintentional dictation errors.  ?  Dionne Bucy, MD ?12/23/21 2312 ? ?

## 2021-12-23 NOTE — ED Notes (Signed)
Lab called for critical potassium of 2.5. MD notified. ?

## 2021-12-23 NOTE — ED Provider Notes (Signed)
11:10 PM  Assumed care at signout.  Patient with no oral intake and diarrhea for 3 days.  Labs show potassium of 2.7, metabolic acidosis and a UTI.  Family was aware of UTI and she is on antibiotics however they are having a hard time getting her to take pills.  CT of the abdomen pelvis shows no acute abnormality.  Plan is to hydrate patient, replace potassium and attempt p.o. challenge.  Patient may need admission. ? ?We will repeat EKG, add on urine culture and magnesium level. ? ?2:17 AM  Pt's repeat EKG shows no interval abnormalities.  Magnesium level normal.  I did repeat a BMP and it shows that her anion gap has closed but she still has a metabolic acidosis.  Her hypokalemia has improved.  I spoke to her son Jill Alexanders by phone who would prefer admission at this time.  She has been able to tolerate a small amount of p.o. here. Will discuss with hospitalist for admission. ? ?2:47 AM Consulted and discussed patient's case with hospitalist, Dr. Para March.  I have recommended admission and consulting physician agrees and will place admission orders.  Patient (and family if present) agree with this plan.  ? ?I reviewed all nursing notes, vitals, pertinent previous records.  All labs, EKGs, imaging ordered have been independently reviewed and interpreted by myself. ? ?  ?Shaqueta Casady, Layla Maw, DO ?12/24/21 0248 ? ?

## 2021-12-24 ENCOUNTER — Encounter: Payer: Self-pay | Admitting: Internal Medicine

## 2021-12-24 DIAGNOSIS — Z6821 Body mass index (BMI) 21.0-21.9, adult: Secondary | ICD-10-CM | POA: Diagnosis not present

## 2021-12-24 DIAGNOSIS — Z79899 Other long term (current) drug therapy: Secondary | ICD-10-CM | POA: Diagnosis not present

## 2021-12-24 DIAGNOSIS — E872 Acidosis, unspecified: Secondary | ICD-10-CM

## 2021-12-24 DIAGNOSIS — Z9842 Cataract extraction status, left eye: Secondary | ICD-10-CM | POA: Diagnosis not present

## 2021-12-24 DIAGNOSIS — E876 Hypokalemia: Secondary | ICD-10-CM | POA: Diagnosis present

## 2021-12-24 DIAGNOSIS — Z8249 Family history of ischemic heart disease and other diseases of the circulatory system: Secondary | ICD-10-CM | POA: Diagnosis not present

## 2021-12-24 DIAGNOSIS — R627 Adult failure to thrive: Secondary | ICD-10-CM

## 2021-12-24 DIAGNOSIS — Z515 Encounter for palliative care: Secondary | ICD-10-CM | POA: Diagnosis not present

## 2021-12-24 DIAGNOSIS — Z933 Colostomy status: Secondary | ICD-10-CM | POA: Diagnosis not present

## 2021-12-24 DIAGNOSIS — K8020 Calculus of gallbladder without cholecystitis without obstruction: Secondary | ICD-10-CM | POA: Diagnosis present

## 2021-12-24 DIAGNOSIS — R197 Diarrhea, unspecified: Secondary | ICD-10-CM | POA: Diagnosis present

## 2021-12-24 DIAGNOSIS — Z20822 Contact with and (suspected) exposure to covid-19: Secondary | ICD-10-CM | POA: Diagnosis present

## 2021-12-24 DIAGNOSIS — Z66 Do not resuscitate: Secondary | ICD-10-CM | POA: Diagnosis present

## 2021-12-24 DIAGNOSIS — N390 Urinary tract infection, site not specified: Secondary | ICD-10-CM | POA: Diagnosis present

## 2021-12-24 DIAGNOSIS — Z9841 Cataract extraction status, right eye: Secondary | ICD-10-CM | POA: Diagnosis not present

## 2021-12-24 DIAGNOSIS — E43 Unspecified severe protein-calorie malnutrition: Secondary | ICD-10-CM | POA: Diagnosis present

## 2021-12-24 DIAGNOSIS — Z87440 Personal history of urinary (tract) infections: Secondary | ICD-10-CM | POA: Diagnosis not present

## 2021-12-24 DIAGNOSIS — Z9049 Acquired absence of other specified parts of digestive tract: Secondary | ICD-10-CM | POA: Diagnosis not present

## 2021-12-24 DIAGNOSIS — Z90710 Acquired absence of both cervix and uterus: Secondary | ICD-10-CM | POA: Diagnosis not present

## 2021-12-24 DIAGNOSIS — I10 Essential (primary) hypertension: Secondary | ICD-10-CM | POA: Diagnosis present

## 2021-12-24 DIAGNOSIS — F0390 Unspecified dementia without behavioral disturbance: Secondary | ICD-10-CM | POA: Diagnosis present

## 2021-12-24 DIAGNOSIS — D649 Anemia, unspecified: Secondary | ICD-10-CM | POA: Diagnosis present

## 2021-12-24 DIAGNOSIS — Z888 Allergy status to other drugs, medicaments and biological substances status: Secondary | ICD-10-CM | POA: Diagnosis not present

## 2021-12-24 LAB — GASTROINTESTINAL PANEL BY PCR, STOOL (REPLACES STOOL CULTURE)

## 2021-12-24 LAB — CBC
HCT: 27.2 % — ABNORMAL LOW (ref 36.0–46.0)
Hemoglobin: 8.7 g/dL — ABNORMAL LOW (ref 12.0–15.0)
MCH: 31 pg (ref 26.0–34.0)
MCHC: 32 g/dL (ref 30.0–36.0)
MCV: 96.8 fL (ref 80.0–100.0)
Platelets: 404 10*3/uL — ABNORMAL HIGH (ref 150–400)
RBC: 2.81 MIL/uL — ABNORMAL LOW (ref 3.87–5.11)
RDW: 16.1 % — ABNORMAL HIGH (ref 11.5–15.5)
WBC: 7.4 10*3/uL (ref 4.0–10.5)
nRBC: 0 % (ref 0.0–0.2)

## 2021-12-24 LAB — C DIFFICILE QUICK SCREEN W PCR REFLEX
C Diff antigen: NEGATIVE
C Diff interpretation: NOT DETECTED
C Diff toxin: NEGATIVE

## 2021-12-24 LAB — BASIC METABOLIC PANEL
Anion gap: 6 (ref 5–15)
Anion gap: 10 (ref 5–15)
BUN: 17 mg/dL (ref 8–23)
BUN: 16 mg/dL (ref 8–23)
CO2: 16 mmol/L — ABNORMAL LOW (ref 22–32)
CO2: 15 mmol/L — ABNORMAL LOW (ref 22–32)
Calcium: 8.4 mg/dL — ABNORMAL LOW (ref 8.9–10.3)
Calcium: 8.8 mg/dL — ABNORMAL LOW (ref 8.9–10.3)
Chloride: 113 mmol/L — ABNORMAL HIGH (ref 98–111)
Chloride: 113 mmol/L — ABNORMAL HIGH (ref 98–111)
Creatinine, Ser: 0.84 mg/dL (ref 0.44–1.00)
Creatinine, Ser: 0.9 mg/dL (ref 0.44–1.00)
GFR, Estimated: >60 mL/min (ref >60)
GFR, Estimated: >60 mL/min (ref >60)
Glucose, Bld: 119 mg/dL — ABNORMAL HIGH (ref 70–99)
Glucose, Bld: 109 mg/dL — ABNORMAL HIGH (ref 70–99)
Potassium: 3.5 mmol/L (ref 3.5–5.1)
Potassium: 3.2 mmol/L — ABNORMAL LOW (ref 3.5–5.1)
Sodium: 135 mmol/L (ref 135–145)
Sodium: 138 mmol/L (ref 135–145)

## 2021-12-24 LAB — MAGNESIUM: Magnesium: 1.8 mg/dL (ref 1.7–2.4)

## 2021-12-24 MED ORDER — SERTRALINE HCL 50 MG PO TABS
50.0000 mg | ORAL_TABLET | Freq: Every day | ORAL | Status: DC
  Administered 2021-12-24 – 2021-12-26 (×3): 50 mg via ORAL
  Filled 2021-12-24 (×3): qty 1

## 2021-12-24 MED ORDER — POTASSIUM CHLORIDE CRYS ER 20 MEQ PO TBCR
40.0000 meq | EXTENDED_RELEASE_TABLET | ORAL | Status: AC
  Administered 2021-12-24: 40 meq via ORAL
  Filled 2021-12-24 (×2): qty 2

## 2021-12-24 MED ORDER — ACETAMINOPHEN 325 MG PO TABS: 650.0000 mg | ORAL_TABLET | Freq: Four times a day (QID) | ORAL | Status: DC | PRN

## 2021-12-24 MED ORDER — LOPERAMIDE HCL 2 MG PO CAPS
2.0000 mg | ORAL_CAPSULE | Freq: Three times a day (TID) | ORAL | Status: DC
  Administered 2021-12-24 – 2021-12-26 (×7): 2 mg via ORAL
  Filled 2021-12-24 (×7): qty 1

## 2021-12-24 MED ORDER — SODIUM CHLORIDE 0.9 % IV SOLN
1.0000 g | INTRAVENOUS | Status: AC
  Administered 2021-12-24 – 2021-12-26 (×3): 1 g via INTRAVENOUS
  Filled 2021-12-24 (×3): qty 10

## 2021-12-24 MED ORDER — ONDANSETRON HCL 4 MG/2ML IJ SOLN: 4.0000 mg | Freq: Four times a day (QID) | INTRAMUSCULAR | Status: DC | PRN

## 2021-12-24 MED ORDER — POTASSIUM CHLORIDE 20 MEQ PO PACK
40.0000 meq | PACK | Freq: Once | ORAL | Status: AC
  Administered 2021-12-24: 40 meq via ORAL
  Filled 2021-12-24: qty 2

## 2021-12-24 MED ORDER — FERROUS SULFATE 325 (65 FE) MG PO TABS
325.0000 mg | ORAL_TABLET | Freq: Every day | ORAL | Status: DC
  Administered 2021-12-24 – 2021-12-26 (×3): 325 mg via ORAL
  Filled 2021-12-24 (×3): qty 1

## 2021-12-24 MED ORDER — DIPHENOXYLATE-ATROPINE 2.5-0.025 MG/5ML PO LIQD
5.0000 mL | Freq: Four times a day (QID) | ORAL | Status: DC | PRN
  Filled 2021-12-24: qty 5

## 2021-12-24 MED ORDER — SODIUM CHLORIDE 0.9 % IV SOLN
2.0000 g | Freq: Once | INTRAVENOUS | Status: AC
  Administered 2021-12-24: 2 g via INTRAVENOUS
  Filled 2021-12-24: qty 2

## 2021-12-24 MED ORDER — ENOXAPARIN SODIUM 40 MG/0.4ML IJ SOSY
40.0000 mg | PREFILLED_SYRINGE | INTRAMUSCULAR | Status: DC
  Administered 2021-12-24 – 2021-12-26 (×3): 40 mg via SUBCUTANEOUS
  Filled 2021-12-24 (×3): qty 0.4

## 2021-12-24 MED ORDER — SODIUM BICARBONATE 650 MG PO TABS
650.0000 mg | ORAL_TABLET | Freq: Two times a day (BID) | ORAL | Status: DC
  Administered 2021-12-24 – 2021-12-26 (×5): 650 mg via ORAL
  Filled 2021-12-24 (×8): qty 1

## 2021-12-24 MED ORDER — ONDANSETRON HCL 4 MG PO TABS: 4.0000 mg | ORAL_TABLET | Freq: Four times a day (QID) | ORAL | Status: DC | PRN

## 2021-12-24 MED ORDER — MIRTAZAPINE 15 MG PO TABS
7.5000 mg | ORAL_TABLET | Freq: Every day | ORAL | Status: DC
  Administered 2021-12-25: 7.5 mg via ORAL
  Filled 2021-12-24 (×2): qty 1

## 2021-12-24 MED ORDER — ACETAMINOPHEN 650 MG RE SUPP: 650.0000 mg | Freq: Four times a day (QID) | RECTAL | Status: DC | PRN

## 2021-12-24 MED ORDER — SODIUM CHLORIDE 0.9 % IV BOLUS (SEPSIS): 500.0000 mL | Freq: Once | INTRAVENOUS | Status: DC

## 2021-12-24 NOTE — Assessment & Plan Note (Addendum)
Continue supportive care delirium precaution, continue home Remeron, Zoloft and Xanax as needed.Daughter [name] plans to take patient home with hospice as she is declining and not eating ?

## 2021-12-24 NOTE — Assessment & Plan Note (Addendum)
Anemia of chronic disease, hemoglobin overall stable.  No indication to pursue ?Recent Labs  ?Lab 12/23/21 ?1815 12/24/21 ?0503  ?HGB 8.9* 8.7*  ?HCT 26.8* 27.2*  ? ?

## 2021-12-24 NOTE — ED Notes (Signed)
RN at bedside. Pt unable to follow directions for PO K+ administration. Pt able to swallow other pill with no issues.  ?

## 2021-12-24 NOTE — ED Notes (Signed)
Pt given meal tray, pt consumed 25% of meal with this RN assistance  ?

## 2021-12-24 NOTE — Assessment & Plan Note (Addendum)
Colostomy in place with liquidy stool: ?Poor oral intake: ?C.difficile and GI panel negative, patient placed on Imodium scheduled and improved now. Change to prn.Continue supportive care.  Renal function remains a stable.  Encourage oral intake ?Recent Labs  ?Lab 12/23/21 ?1815 12/24/21 ?0141 12/24/21 ?[id number] 12/25/21 ?[id number]  ?BUN 21 17 16 17   ?CREATININE 1.01* 0.84 0.90 0.85  ? ?

## 2021-12-24 NOTE — Assessment & Plan Note (Addendum)
See above

## 2021-12-24 NOTE — ED Notes (Signed)
Pt given breakfast tray

## 2021-12-24 NOTE — Assessment & Plan Note (Addendum)
Due to #1.  Repleted.Continue potassium chloride supplement at home. ?

## 2021-12-24 NOTE — H&P (Addendum)
History and Physical    Patient: Katherine Wood [id number] DOB: 04-Jan-1941 DOA: 12/23/2021 DOS: the patient was seen and examined on 12/24/2021 PCP: Jon Billings, NP  Patient coming from: Home  Chief Complaint:  Chief Complaint  Patient presents with   Diarrhea    HPI: Katherine Wood is a 81 y.o. female with medical history significant of Dementia, partial colectomy with colostomy, recurrent UTI with E. coli 11/16/2021 and Aerococcus 12/07/2021 who presents to the ED with a 3-day history of diarrhea/high colostomy output and decreased appetite.  History is limited due to dementia.  She has had no fever or vomiting.  She was previously treated with antibiotics but difficulty complying with medication due to dementia.Caregiver at bedside and daughter, Nevin Bloodgood on speaker phone. Daughter has arranged hospice for patient when discharged. ED course: On arrival, vitals within normal limits.  Blood work significant for anemia with hemoglobin of 8.9, normal WBC of 8800, potassium 2.7 with creatinine of 1.01 and bicarb of 14.  Urinalysis showed large leukocyte esterase and many bacteria.  Troponin and LFTs within normal limits.  Troponin 10. COVID and flu negative EKG, personally viewed and interpreted with sinus rhythm at 88 with no acute ST-T wave changes CT abdomen and pelvis showing cholelithiasis, partial colectomy with colostomy otherwise nonacute Patient treated with oral and IV potassium, started on cefepime, hydrated with NS.  Hospitalist consulted for admission.   Review of Systems: unable to review all systems due to the inability of the patient to answer questions. Past Medical History:  Diagnosis Date   Clinical depression 02/13/2015   Colostomy in place [facility])    Diverticulitis of colon with perforation 01/19/2016   Enterococcus, vancomycin-resistant 02/25/2016   Hypertension    Past Surgical History:  Procedure Laterality Date   CATARACT EXTRACTION Bilateral 08/25/2012    [facility]- Dr. Dolores Lory   INCISION AND DRAINAGE ABSCESS N/A 02/01/2016   Procedure: INCISION AND DRAINAGE ABSCESS-OPEN;  Surgeon: Jules Husbands, MD;  Location: [facility] ORS;  Service: General;  Laterality: N/A;   LAPAROSCOPY N/A 02/01/2016   Procedure: Attempted laparoscopic,;  Surgeon: Jules Husbands, MD;  Location: [facility] ORS;  Service: General;  Laterality: N/A;   LAPAROTOMY N/A 01/18/2016   Procedure: EXPLORATORY LAPAROTOMY, LYSIS OF ADHESIONS, DRAINAGE OF INTRABDOMINAL ABSCESS, HARTMAN'S PROCEDURE, UMBILICAL HERNIA REPAIR ;  Surgeon: Jules Husbands, MD;  Location: [facility] ORS;  Service: General;  Laterality: N/A;   LAPAROTOMY N/A 02/01/2016   Procedure: EXPLORATORY LAPAROTOMY , drainage of abscess, lysis of ahesions;  Surgeon: Jules Husbands, MD;  Location: [facility] ORS;  Service: General;  Laterality: N/A;   TOTAL ABDOMINAL HYSTERECTOMY  10/26/1987   Social History:  reports that she has never smoked. She has never used smokeless tobacco. She reports that she does not drink alcohol and does not use drugs.  Allergies  Allergen Reactions   Alendronate Sodium Other (See Comments)    Joint pains Joint pains   Galantamine Other (See Comments)    Family History  Problem Relation Age of Onset   Hypertension Mother    Parkinsonism Mother    Heart disease Father    Coronary artery disease Brother    Heart attack Brother     Prior to Admission medications   Medication Sig Start Date End Date Taking? Authorizing Provider  ALPRAZolam (XANAX) 0.25 MG tablet Take 1 tablet (0.25 mg total) by mouth 2 (two) times daily as needed for anxiety. 11/23/21  Yes Jon Billings, NP  Calcium Carbonate (CALCIUM  600 PO) Take 900 mg by mouth daily.   Yes [provider]  Cholecalciferol (VITAMIN D3) 5000 units CAPS Take 5,000 Units by mouth daily.   Yes [provider]  ferrous sulfate 325 (65 FE) MG EC tablet Take 325 mg by mouth daily.   Yes [provider]  mirtazapine  (REMERON) 7.5 MG tablet Take 1 tablet (7.5 mg total) by mouth at bedtime. 12/04/21  Yes Jon Billings, NP  nitrofurantoin (FURADANTIN) 25 MG/5ML suspension Take 20 mLs (100 mg total) by mouth 2 (two) times daily for 7 days. 12/22/21 12/29/21 Yes Jon Billings, NP  sertraline (ZOLOFT) 50 MG tablet Take 1 tablet (50 mg total) by mouth daily. 10/29/21  Yes Jon Billings, NP  vitamin E 400 UNIT capsule Take 1,000 Units by mouth daily.   Yes [provider]    Physical Exam: Vitals:   12/23/21 2300 12/24/21 0000 12/24/21 0030 12/24/21 0130  BP: 132/87 114/90 121/62 114/80  Pulse: 76 87 82 83  Resp: 20 (!) 22 (!) 32 (!) 22  Temp:      TempSrc:      SpO2: 98% 95% 100% 100%  Weight:      Height:       Physical Exam Vitals and nursing note reviewed.  Constitutional:      General: She is not in acute distress.    Appearance: Normal appearance.  HENT:     Head: Normocephalic and atraumatic.  Cardiovascular:     Rate and Rhythm: Normal rate and regular rhythm.     Pulses: Normal pulses.     Heart sounds: Normal heart sounds. No murmur heard. Pulmonary:     Effort: Pulmonary effort is normal.     Breath sounds: Normal breath sounds. No wheezing or rhonchi.  Abdominal:     General: Bowel sounds are normal.     Palpations: Abdomen is soft.     Tenderness: There is no abdominal tenderness.     Comments: colostomy  Musculoskeletal:        General: No swelling or tenderness. Normal range of motion.     Cervical back: Normal range of motion and neck supple.  Skin:    General: Skin is warm and dry.  Neurological:     General: No focal deficit present.     Mental Status: She is alert. Mental status is at baseline. She is disoriented.  Psychiatric:        Mood and Affect: Mood normal.        Behavior: Behavior normal.     Data Reviewed: Relevant notes from primary care and specialist visits, past discharge summaries as available in EHR, including Care Everywhere. Prior  diagnostic testing as pertinent to current admission diagnoses Updated medications and problem lists for reconciliation ED course, including vitals, labs, imaging, treatment and response to treatment Triage notes, nursing and pharmacy notes and ED provider's notes Notable results as noted in HPI   Assessment and Plan: * Diarrhea GI panel and stool for C. difficile given recent antibiotics IV hydration  UTI (urinary tract infection) Follow urine culture Received cefepime in the ED Will hold off on further antibiotics pending urine culture given diarrhea in the setting of recent antibiotic treatment  Metabolic acidosis Secondary to diarrhea Improving with hydration in the ED Continue to monitor  Hypokalemia- (present on admission) Received oral and IV repletion in the ED Continue to monitor and correct as needed  Colostomy in place [facility]) Colostomy care  Anemia- (present on admission) Hemoglobin  8.9, down from baseline Uncertain acuity and etiology Anemia panel and continue to monitor CBC Latest Ref Rng & Units 12/23/2021 10/21/2021 06/19/2021  WBC 4.0 - 10.5 K/uL 8.8 7.1 5.6  Hemoglobin 12.0 - 15.0 g/dL 8.9(L) 10.2(L) 11.6  Hematocrit 36.0 - 46.0 % 26.8(L) 31.2(L) 35.0  Platelets 150 - 400 K/uL 431(H) 349 316     Alzheimer's dementia ([place])- (present on admission) Delirium precautions Continue Remeron, Zoloft and Xanax as needed Daughter Nevin Bloodgood plans to take patient home with hospice as she is declining and not eating       Advance Care Planning:   Code Status: Prior DNR  Consults: none  Family Communication: Nevin Bloodgood, daughter  Severity of Illness: The appropriate patient status for this patient is OBSERVATION. Observation status is judged to be reasonable and necessary in order to provide the required intensity of service to ensure the patient's safety. The patient's presenting symptoms, physical exam findings, and initial radiographic and laboratory data in the  context of their medical condition is felt to place them at decreased risk for further clinical deterioration. Furthermore, it is anticipated that the patient will be medically stable for discharge from the hospital within 2 midnights of admission.   Author: Athena Masse, MD 12/24/2021 3:05 AM  For on call review [email or web address].

## 2021-12-24 NOTE — ED Notes (Signed)
Meal tray at bedside. Pt stating she is not hungry and does not want to eat ?

## 2021-12-24 NOTE — Assessment & Plan Note (Addendum)
Due to diarrhea.  Bicarb level improved.  DC sodium bicarb ? ?

## 2021-12-24 NOTE — Assessment & Plan Note (Addendum)
Recent Aerococcus positive.  Urine culture so far negative completing today 3 days of IV antibiotics.   ?

## 2021-12-24 NOTE — Hospital Course (Addendum)
81 y.o. f w/ Dementia, partial colectomy with colostomy, recurrent UTI with E. coli 11/16/2021 and Aerococcus 12/07/2021  admitted with a 3-day history of diarrhea/high colostomy output and decreased appetite. History is limited due to dementia. In ED Caregiver at bedside and daughter, [name] on speaker phone. Daughter has arranged hospice for patient when discharged. ?In ED-vitals normal work-up showed anemia w/ hb-8.9,normal WBC of 8800, K-2.7 with creatinine of 1.01 and bicarb of 14.  UA-large leukocyte esterase and many bacteria.  Troponin and LFTs within normal limits.  Troponin 10.COVID and flu negative. EKG, personally viewed and interpreted with sinus rhythm at 88 with no acute ST-T wave changes.CT abdomen and pelvis showing cholelithiasis, partial colectomy with colostomy otherwise non-acute. Patient was given oral and IV potassium, started on IV antibiotics ivf and admitted.  Patient had a diarrhea in colostomy that was negative for C. difficile and GI panel.  Patient was symptomatically treated with Imodium her bowel output from colostomy significantly improved.  Seen by dietitian for severe protein calorie malnutrition.  Said she had hypokalemia that resolved.  At this time she remains medically stable although overall guarded prognosis palliative care was consulted-but since plan was in place for patient to return home with hospice [facility] consulted to assist as per family wishes.  Patient will complete IV antibiotics prior to discharge.  Urine culture negative. ?

## 2021-12-24 NOTE — Progress Notes (Signed)
Patient seen and examined personally, I reviewed the chart, history and physical and admission note, done by admitting physician this morning and agree with the same with following addendum.  Please refer to the morning admission note for more detailed plan of care. ? ?Briefly,  ?81 y.o. f w/ Dementia, partial colectomy with colostomy, recurrent UTI with E. coli 11/16/2021 and Aerococcus 12/07/2021  admitted with a 3-day history of diarrhea/high colostomy output and decreased appetite. History is limited due to dementia. In ED Caregiver at bedside and daughter, Nevin Bloodgood on speaker phone. Daughter has arranged hospice for patient when discharged. ?In ED-vitals normal work-up showed anemia w/ hb-8.9,normal WBC of 8800, K-2.7 with creatinine of 1.01 and bicarb of 14.  UA-large leukocyte esterase and many bacteria.  Troponin and LFTs within normal limits.  Troponin 10.COVID and flu negative. EKG, personally viewed and interpreted with sinus rhythm at 88 with no acute ST-T wave changes.CT abdomen and pelvis showing cholelithiasis, partial colectomy with colostomy otherwise non-acute. Patient was given oral and IV potassium, started on cefepime,ivf and admitted. ? ?Seen and examined this morning.  Patient is alert awake able to tell me her name responded yes and no otherwise not much communicative does not follow other instruction, able to move her extremities and nonfocal. ?Colostomy bag in place with liquidy stool ? ?Issues ? ?Diarrhea with colostomy in place ?High colostomy output:  ?C. Difficile and GI panel negative.  CT abdomen no acute finding.  We will add Imodium. Monitor output. ? ?UTI-follow-up urine culture.  We will continue IV antibiotics since C. difficile is negative. ? ?Metabolic acidosis- hco3 at 15, likely from diarrhea, add sodium bicarb.monitor ?Hypoaklemia-due to diarrhea.  Replete. ? ?Anemia chronic- in mid 8s stable ?Recent Labs  ?Lab 12/23/21 ?1815 12/24/21 ?0503  ?HGB 8.9* 8.7*  ?HCT 26.8* 27.2*   ? ?Goals of care ?Failure to thrive ?Poor oral intake ?Dementia ?: Currently DNR.  Due to ongoing decline patient daughter is considering to enroll the patient to hospice upon return to home.  We will consult palliative care.  Continue supportive care for now. ?  ?

## 2021-12-24 NOTE — ED Notes (Signed)
Colostomy bag leaking, colostomy changed, patient bathed, gown and linens changed. ?

## 2021-12-24 NOTE — ED Notes (Signed)
This RN and Forensic scientist at bedside. Pt's colostomy bag peeling off and pt covered in watery stool. Pt cleaned, linens changed, new brief in place. New colostomy bag placed on pt.  ?

## 2021-12-25 DIAGNOSIS — R197 Diarrhea, unspecified: Secondary | ICD-10-CM | POA: Diagnosis not present

## 2021-12-25 DIAGNOSIS — Z7189 Other specified counseling: Secondary | ICD-10-CM

## 2021-12-25 DIAGNOSIS — R627 Adult failure to thrive: Secondary | ICD-10-CM

## 2021-12-25 LAB — BASIC METABOLIC PANEL
Anion gap: 9 (ref 5–15)
BUN: 17 mg/dL (ref 8–23)
CO2: 19 mmol/L — ABNORMAL LOW (ref 22–32)
Calcium: 8.7 mg/dL — ABNORMAL LOW (ref 8.9–10.3)
Chloride: 113 mmol/L — ABNORMAL HIGH (ref 98–111)
Creatinine, Ser: 0.85 mg/dL (ref 0.44–1.00)
GFR, Estimated: >60 mL/min (ref >60)
Glucose, Bld: 106 mg/dL — ABNORMAL HIGH (ref 70–99)
Potassium: 2.9 mmol/L — ABNORMAL LOW (ref 3.5–5.1)
Sodium: 141 mmol/L (ref 135–145)

## 2021-12-25 LAB — URINE CULTURE: Culture: NO GROWTH

## 2021-12-25 LAB — POTASSIUM: Potassium: 3.3 mmol/L — ABNORMAL LOW (ref 3.5–5.1)

## 2021-12-25 MED ORDER — ENSURE ENLIVE PO LIQD
237.0000 mL | Freq: Three times a day (TID) | ORAL | Status: DC
  Administered 2021-12-26: 237 mL via ORAL

## 2021-12-25 MED ORDER — POTASSIUM CHLORIDE CRYS ER 20 MEQ PO TBCR
20.0000 meq | EXTENDED_RELEASE_TABLET | Freq: Every day | ORAL | Status: DC
  Administered 2021-12-25 – 2021-12-26 (×2): 20 meq via ORAL
  Filled 2021-12-25 (×2): qty 1

## 2021-12-25 MED ORDER — ADULT MULTIVITAMIN W/MINERALS CH
1.0000 | ORAL_TABLET | Freq: Every day | ORAL | Status: DC
  Administered 2021-12-25 – 2021-12-26 (×2): 1 via ORAL
  Filled 2021-12-25 (×2): qty 1

## 2021-12-25 MED ORDER — POTASSIUM CHLORIDE CRYS ER 20 MEQ PO TBCR
30.0000 meq | EXTENDED_RELEASE_TABLET | ORAL | Status: AC
  Administered 2021-12-25 (×2): 30 meq via ORAL
  Filled 2021-12-25 (×2): qty 1

## 2021-12-25 MED ORDER — POTASSIUM CHLORIDE 10 MEQ/100ML IV SOLN
10.0000 meq | INTRAVENOUS | Status: AC
  Administered 2021-12-25 (×2): 10 meq via INTRAVENOUS
  Filled 2021-12-25 (×2): qty 100

## 2021-12-25 MED ORDER — POTASSIUM CHLORIDE 10 MEQ/100ML IV SOLN
10.0000 meq | INTRAVENOUS | Status: DC
  Administered 2021-12-25: 10 meq via INTRAVENOUS

## 2021-12-25 NOTE — Assessment & Plan Note (Addendum)
Goals of care: ?Continue supportive care, palliative care has been consulted-they are unable to see however daughter has already made arrangement for patient to return home with hospice TOC was consulted.  She is stable for discharge spoke with the son and they are planning for return to home today with hospice. ?

## 2021-12-25 NOTE — Plan of Care (Signed)

## 2021-12-25 NOTE — Progress Notes (Signed)
?  ?  Palliative Medicine Team ? ? ? ?Palliative Medicine consult noted. Due to high referral volume, there may be a delay seeing this patient. Please call the Palliative Medicine Team office at [phone] if recommendations are needed in the interim. A provider will be available on site 12/28/2021. If patient is expected to discharge before this date, please make outpatient palliative care referral as appropriate.  ?  ? ?  ? ?Thank you for inviting us to participate in the care of this patient. ?  ?Rasheedah Reis, MSN, RN ?Palliative Medicine Team  ?

## 2021-12-25 NOTE — Plan of Care (Signed)
?  Problem: Education: ?Goal: Knowledge of General Education information will improve ?Description: Including pain rating scale, medication(s)/side effects and non-pharmacologic comfort measures ?Outcome: Progressing ?  ?Problem: Clinical Measurements: ?Goal: Ability to maintain clinical measurements within normal limits will improve ?Outcome: Progressing ?  ?Problem: Clinical Measurements: ?Goal: Will remain free from infection ?Outcome: Progressing ?  ?Problem: Activity: ?Goal: Risk for activity intolerance will decrease ?Outcome: Progressing ?  ?Problem: Nutrition: ?Goal: Adequate nutrition will be maintained ?Outcome: Progressing ?  ?Problem: Elimination: ?Goal: Will not experience complications related to urinary retention ?Outcome: Progressing ?  ?Problem: Skin Integrity: ?Goal: Risk for impaired skin integrity will decrease ?Outcome: Progressing ?  ?

## 2021-12-25 NOTE — Progress Notes (Addendum)
Initial Nutrition Assessment ? ?DOCUMENTATION CODES:  ? ?Severe malnutrition in context of chronic illness ? ?INTERVENTION:  ? ?-Ensure Enlive po TID, each supplement provides 350 kcal and 20 grams of protein.  ?-MVI with minerals daily ?-Feeding assistance ?-Liberalize diet to regular diet ? ?NUTRITION DIAGNOSIS:  ? ?Severe Malnutrition related to chronic illness (dementia) as evidenced by percent weight loss, moderate fat depletion, severe fat depletion, moderate muscle depletion, severe muscle depletion. ? ?GOAL:  ? ?Patient will meet greater than or equal to 90% of their needs ? ?MONITOR:  ? ?PO intake, Supplement acceptance, Labs, Weight trends, Skin, I & O's ? ?REASON FOR ASSESSMENT:  ? ?Malnutrition Screening Tool ?  ? ?ASSESSMENT:  ? ?Katherine Wood is a 81 y.o. female with medical history significant of Dementia, partial colectomy with colostomy, recurrent UTI with E. coli 11/16/2021 and Aerococcus 12/07/2021 who presents to the ED with a 3-day history of diarrhea/high colostomy output and decreased appetite.  History is limited due to dementia.  She has had no fever or vomiting.  She was previously treated with antibiotics but difficulty complying with medication due to dementia.Caregiver at bedside and daughter, [name] on speaker phone. Daughter has arranged hospice for patient when discharged. ? ?Pt admitted with diarrhea, UTI, and metabolic acidosis.  ? ?Reviewed I/O's: -200 ml x 24 hours and +500 ml x 24 hours  ? ?Spoke with pt at bedside. Pt smiling at time of visit and responded "yes", "okay", and "thank you" to questions. Pt stated "I'm gonna go to sleep" when RD finished exam.  ? ?Observed breakfast tray- pt consumed about 25%. Pt reports she is eating well here and at home. Documented meal completions 0%.  ?  ?Reviewed wt hx; pt has experienced a 13.8% wt loss over the past 6 months, which is significant for time frame.  ? ?Per TOC notes, plan to discharge home with hospice. Palliative care  consult pending.  ? ?Medications reviewed and include ferrous sulfate, remeron, potassium chloride, and sodium bicarbonate.  ? ?Labs reviewed: K: 3.3.   ? ?NUTRITION - FOCUSED PHYSICAL EXAM: ? ?Flowsheet Row Most Recent Value  ?Orbital Region Severe depletion  ?Upper Arm Region Severe depletion  ?Thoracic and Lumbar Region Severe depletion  ?Buccal Region Moderate depletion  ?Temple Region Severe depletion  ?Clavicle Bone Region Severe depletion  ?Clavicle and Acromion Bone Region Severe depletion  ?Scapular Bone Region Severe depletion  ?Dorsal Hand Moderate depletion  ?Patellar Region Severe depletion  ?Anterior Thigh Region Severe depletion  ?Posterior Calf Region Severe depletion  ?Edema (RD Assessment) None  ?Hair Reviewed  ?Eyes Reviewed  ?Mouth Reviewed  ?Skin Reviewed  ?Nails Reviewed  ? ?  ? ? ?Diet Order:   ?Diet Order   ? ?       ?  Diet Heart Room service appropriate? Yes; Fluid consistency: Thin  Diet effective now       ?  ? ?  ?  ? ?  ? ? ?EDUCATION NEEDS:  ? ?Not appropriate for education at this time ? ?Skin:  Skin Assessment: Reviewed RN Assessment ? ?Last BM:  12/25/21 (via colostomy) ? ?Height:  ? ?Ht Readings from Last 1 Encounters:  ?12/23/21 5\' 3"  (1.6 m)  ? ? ?Weight:  ? ?Wt Readings from Last 1 Encounters:  ?12/23/21 54.3 kg  ? ? ?Ideal Body Weight:  52.3 kg ? ?BMI:  Body mass index is 21.21 kg/m?. ? ?Estimated Nutritional Needs:  ? ?Kcal:  1650-1850 ? ?Protein:  85-100 grams ? ?  Fluid:  > 1.6 L ? ? ? ?Loistine Chance, RD, LDN, CDCES ?Registered Dietitian II ?Certified Diabetes Care and Education Specialist ?Please refer to [facility] for RD and/or RD on-call/weekend/after hours pager  ?

## 2021-12-25 NOTE — TOC Initial Note (Signed)
Transition of Care (TOC) - Initial/Assessment Note  ? ? ?Patient Details  ?Name: Katherine Wood ?MRN: [id number] ?Date of Birth: Dec 06, 1940 ? ?Transition of Care (TOC) CM/SW Contact:    ?Marlowe Sax, RN ?Phone Number: ?12/25/2021, 10:35 AM ? ?Clinical Narrative:                 ?Patient from home, daughter has set up Hospice to follow the patient at DC , has a caregiver, Palliative consult made in ER ?  ?medical history significant of Dementia, partial colectomy with colostomy ? ?TOC to follow and assist with needs ? ?Patient Goals and CMS Choice ?  ?  ?  ? ?Expected Discharge Plan and Services ?  ?  ?  ?  ?  ?                ?  ?  ?  ?  ?  ?  ?  ?  ?  ?  ? ?Prior Living Arrangements/Services ?  ?  ?  ?       ?  ?  ?  ?  ? ?Activities of Daily Living ?Home Assistive Devices/Equipment: Ostomy supplies, Walker (specify type) ?ADL Screening (condition at time of admission) ?Patient's cognitive ability adequate to safely complete daily activities?: No ?Is the patient deaf or have difficulty hearing?: Yes ?Does the patient have difficulty seeing, even when wearing glasses/contacts?: No ?Does the patient have difficulty concentrating, remembering, or making decisions?: Yes ?Patient able to express need for assistance with ADLs?: No ?Does the patient have difficulty dressing or bathing?: Yes ?Independently performs ADLs?: No ?Communication: Independent ?Dressing (OT): Needs assistance ?Is this a change from baseline?: Pre-admission baseline ?Grooming: Needs assistance ?Is this a change from baseline?: Pre-admission baseline ?Feeding: Needs assistance ?Is this a change from baseline?: Pre-admission baseline ?Bathing: Needs assistance ?Is this a change from baseline?: Pre-admission baseline ?Toileting: Needs assistance ?Is this a change from baseline?: Pre-admission baseline ?In/Out Bed: Needs assistance ?Is this a change from baseline?: Pre-admission baseline ?Walks in Home: Needs assistance, Independent with device  (comment) ?Is this a change from baseline?: Pre-admission baseline ?Does the patient have difficulty walking or climbing stairs?: Yes ?Weakness of Legs: Both ?Weakness of Arms/Hands: Both ? ?Permission Sought/Granted ?  ?  ?   ?   ?   ?   ? ?Emotional Assessment ?  ?  ?  ?  ?  ?  ? ?Admission diagnosis:  Diarrhea [R19.7] ?Hypokalemia [E87.6] ?Metabolic acidosis [E87.20] ?Urinary tract infection without hematuria, site unspecified [N39.0] ?Diarrhea, unspecified type [R19.7] ?Patient Active Problem List  ? Diagnosis Date Noted  ? Failure to thrive in adult 12/25/2021  ? Goals of care, counseling/discussion 12/25/2021  ? Hypokalemia 12/24/2021  ? Diarrhea 12/24/2021  ? Metabolic acidosis 12/24/2021  ? Colostomy in place [facility])   ? UTI (urinary tract infection) 10/21/2021  ? Allergy   ? Mild single current episode of major depressive disorder (HCC) 12/07/2016  ? Anemia 04/07/2016  ? Diverticulitis large intestine 02/25/2016  ? Hernia of abdominal cavity   ? Alzheimer's dementia (HCC) 07/27/2015  ? Memory loss 05/16/2015  ? Narrowing of intervertebral disc space 02/13/2015  ? Hypercholesteremia 02/13/2015  ? Symptomatic menopausal or female climacteric states 02/13/2015  ? Arthritis, degenerative 02/13/2015  ? Vitamin D deficiency 02/13/2015  ? Connective tissue and disc stenosis of intervertebral foramina of abdomen and other regions 02/13/2015  ? Anxiety state 10/31/2008  ? Arthropathia 10/30/2008  ? Leg varices 10/30/2008  ? Acquired cyst  of kidney 10/30/2008  ? Insomnia 10/30/2008  ? OP (osteoporosis) 10/30/2008  ? Cystocele, midline 10/30/2008  ? ?PCP:  [name], NP ?Pharmacy:   ?[facility] [address] [place] - [address] ?[address] ?[place] [address] ?Phone: [phone] Fax: [phone] ? ?[id number] [place], [place] - [address] AT [facility] [address] ?[address] ?[place] [address] ?Phone: [phone] Fax:  [phone] ? ? ? ? ?Social Determinants of Health (SDOH) Interventions ?  ? ?Readmission Risk Interventions ?No flowsheet data found. ? ? ?

## 2021-12-25 NOTE — Progress Notes (Signed)
PROGRESS NOTE Katherine Wood  YQM:[id number] DOB: 04-28-1941 DOA: 12/23/2021 PCP: Larae GroomsHoldsworth, Karen, NP   Brief Narrative/Hospital Course: 81 y.o. f w/ Dementia, partial colectomy with colostomy, recurrent UTI with E. coli 11/16/2021 and Aerococcus 12/07/2021  admitted with a 3-day history of diarrhea/high colostomy output and decreased appetite. History is limited due to dementia. In ED Caregiver at bedside and daughter, Gunnar Fusipaula on speaker phone. Daughter has arranged hospice for patient when discharged. In ED-vitals normal work-up showed anemia w/ hb-8.9,normal WBC of 8800, K-2.7 with creatinine of 1.01 and bicarb of 14.  UA-large leukocyte esterase and many bacteria.  Troponin and LFTs within normal limits.  Troponin 10.COVID and flu negative. EKG, personally viewed and interpreted with sinus rhythm at 88 with no acute ST-T wave changes.CT abdomen and pelvis showing cholelithiasis, partial colectomy with colostomy otherwise non-acute. Patient was given oral and IV potassium, started on IV antibiotics ivf and admitted.  Patient had a diarrhea in colostomy that was negative for C. difficile and GI panel.    Subjective: Seen this am Alert awake able to tell me her name, otherwise answer " okay" for all questions. Overnight afebrile, stool output 500 mL Potassium remains low Not much output in colostomy  Assessment and Plan: * Diarrhea Colostomy in place with liquidy stool: Poor oral intake: C. difficile and GI panel negative, patient placed on Imodium scheduled and ouptut has improved per RN. Can use prn Lomotil.Continue supportive care.  Renal function remains a stable Recent Labs  Lab 12/23/21 1815 12/24/21 0141 12/24/21 0503 12/25/21 0619  BUN 21 17 16 17   CREATININE 1.01* 0.84 0.90 0.85    Colostomy in place [facility](HCC) See above  Hypokalemia Due to #1.  We will replete aggressively with IV and p.o. potassium chloride.  Mag level is stable previously..   Metabolic acidosis Due to  diarrhea.  Bicarb level improving continue sodium bicarb p.o. monitor.  UTI (urinary tract infection) Recent Aerococcus positive.  Urine culture so far negative will complete 3 days of IV antibiotics.    Goals of care, counseling/discussion See above.  Failure to thrive in adult Goals of care: Continue supportive care, palliative care has been consulted, daughter planning to take patient home with hospice care upon discharge.  Anemia Anemia of chronic disease, hemoglobin overall stable.  No indication to pursue Recent Labs  Lab 12/23/21 1815 12/24/21 0503  HGB 8.9* 8.7*  HCT 26.8* 27.2*    Alzheimer's dementia (HCC) Continue supportive care delirium precaution, continue home Remeron, Zoloft and Xanax as needed.Daughter Gunnar Fusiaula plans to take patient home with hospice as she is declining and not eating  DVT prophylaxis: enoxaparin (LOVENOX) injection 40 mg Start: 12/24/21 0800 Code Status:   Code Status: DNR Family Communication: plan of care discussed with patient/RN-at bedside. Called daughter- not answer  Disposition: Currently not medically stable for discharge. Status is: Inpatient Remains inpatient appropriate because: Ongoing management of diarrhea,goals of care discussion Consult TOC  for home hospice at home, palliative care will not be able to see until Monday  Objective: Vitals last 24 hrs: Vitals:   12/24/21 1900 12/24/21 2200 12/25/21 0352 12/25/21 0833  BP: 123/70 101/80 (!) 126/57 (!) 150/92  Pulse: 78 82 83 98  Resp: 20 18 15 17   Temp:   99 F (37.2 C) 97.6 F (36.4 C)  TempSrc:      SpO2: 100% 100% 99% 97%  Weight:      Height:       Weight change:   Physical Examination: General  exam: AA 0 X2, DEMENTED,older than stated age, weak appearing. HEENT:Oral mucosa moist, Ear/Nose WNL grossly, dentition normal. Respiratory system: bilaterally diminished BS, no use of accessory muscle Cardiovascular system: S1 & S2 +, No JVD,. Gastrointestinal system:  Abdomen soft,colostomy in place- small BM solid,nt,BS+ Nervous System:Alert, awake, moving extremities and grossly nonfocal Extremities: LE edema none,distal peripheral pulses palpable.  Skin: No rashes,no icterus. MSK: Normal muscle bulk,tone, power  Medications reviewed:  Scheduled Meds:  enoxaparin (LOVENOX) injection  40 mg Subcutaneous Q24H   ferrous sulfate  325 mg Oral Daily   loperamide  2 mg Oral TID   mirtazapine  7.5 mg Oral QHS   potassium chloride  30 mEq Oral Q3H   sertraline  50 mg Oral Daily   sodium bicarbonate  650 mg Oral BID   Continuous Infusions:  cefTRIAXone (ROCEPHIN)  IV Stopped (12/24/21 1105)   potassium chloride        Diet Order             Diet Heart Room service appropriate? Yes; Fluid consistency: Thin  Diet effective now                            Intake/Output Summary (Last 24 hours) at 12/25/2021 0848 Last data filed at 12/24/2021 1405 Gross per 24 hour  Intake --  Output 200 ml  Net -200 ml   Net IO Since Admission: 500 mL [12/25/21 0848]  Wt Readings from Last 3 Encounters:  12/23/21 54.3 kg  12/04/21 54.3 kg  10/29/21 60.7 kg     Unresulted Labs (From admission, onward)     Start     Ordered   12/31/21 0500  Creatinine, serum  (enoxaparin (LOVENOX)    CrCl >/= 30 ml/min)  Weekly,   STAT     Comments: while on enoxaparin therapy    12/24/21 0310          Data Reviewed: I have personally reviewed following labs and imaging studies CBC: Recent Labs  Lab 12/23/21 1815 12/24/21 0503  WBC 8.8 7.4  NEUTROABS 6.8  --   HGB 8.9* 8.7*  HCT 26.8* 27.2*  MCV 92.7 96.8  PLT 431* 404*   Basic Metabolic Panel: Recent Labs  Lab 12/23/21 1815 12/24/21 0141 12/24/21 0503 12/25/21 0619  NA 136 135 138 141  K 2.7* 3.5 3.2* 2.9*  CL 105 113* 113* 113*  CO2 14* 16* 15* 19*  GLUCOSE 105* 119* 109* 106*  BUN 21 17 16 17   CREATININE 1.01* 0.84 0.90 0.85  CALCIUM 9.4 8.4* 8.8* 8.7*  MG 1.8  --   --   --     GFR: Estimated Creatinine Clearance: 44.4 mL/min (by C-G formula based on SCr of 0.85 mg/dL). Liver Function Tests: Recent Labs  Lab 12/23/21 1815  AST 18  ALT 9  ALKPHOS 64  BILITOT 0.6  PROT 7.2  ALBUMIN 3.3*   Recent Labs  Lab 12/23/21 1815  LIPASE 38   No results for input(s): AMMONIA in the last 168 hours. Coagulation Profile: No results for input(s): INR, PROTIME in the last 168 hours. Cardiac Enzymes: No results for input(s): CKTOTAL, CKMB, CKMBINDEX, TROPONINI in the last 168 hours. BNP (last 3 results) No results for input(s): PROBNP in the last 8760 hours. HbA1C: No results for input(s): HGBA1C in the last 72 hours. CBG: No results for input(s): GLUCAP in the last 168 hours. Lipid Profile: No results for input(s): CHOL, HDL, LDLCALC,  TRIG, CHOLHDL, LDLDIRECT in the last 72 hours. Thyroid Function Tests: No results for input(s): TSH, T4TOTAL, FREET4, T3FREE, THYROIDAB in the last 72 hours. Anemia Panel: No results for input(s): VITAMINB12, FOLATE, FERRITIN, TIBC, IRON, RETICCTPCT in the last 72 hours. Sepsis Labs: No results for input(s): PROCALCITON, LATICACIDVEN in the last 168 hours.  Recent Results (from the past 240 hour(s))  Resp Panel by RT-PCR (Flu A&B, Covid) Nasopharyngeal Swab     Status: None   Collection Time: 12/23/21  6:15 PM   Specimen: Nasopharyngeal Swab; Nasopharyngeal(NP) swabs in vial transport medium  Result Value Ref Range Status   SARS Coronavirus 2 by RT PCR NEGATIVE NEGATIVE Final    Comment: (NOTE) SARS-CoV-2 target nucleic acids are NOT DETECTED.  The SARS-CoV-2 RNA is generally detectable in upper respiratory specimens during the acute phase of infection. The lowest concentration of SARS-CoV-2 viral copies this assay can detect is 138 copies/mL. A negative result does not preclude SARS-Cov-2 infection and should not be used as the sole basis for treatment or other patient management decisions. A negative result may occur  with  improper specimen collection/handling, submission of specimen other than nasopharyngeal swab, presence of viral mutation(s) within the areas targeted by this assay, and inadequate number of viral copies(<138 copies/mL). A negative result must be combined with clinical observations, patient history, and epidemiological information. The expected result is Negative.  Fact Sheet for Patients:  BloggerCourse.com  Fact Sheet for Healthcare Providers:  SeriousBroker.it  This test is no t yet approved or cleared by the [place] FDA and  has been authorized for detection and/or diagnosis of SARS-CoV-2 by FDA under an Emergency Use Authorization (EUA). This EUA will remain  in effect (meaning this test can be used) for the duration of the COVID-19 declaration under Section 564(b)(1) of the Act, 21 U.S.C.section 360bbb-3(b)(1), unless the authorization is terminated  or revoked sooner.       Influenza A by PCR NEGATIVE NEGATIVE Final   Influenza B by PCR NEGATIVE NEGATIVE Final    Comment: (NOTE) The Xpert Xpress SARS-CoV-2/FLU/RSV plus assay is intended as an aid in the diagnosis of influenza from Nasopharyngeal swab specimens and should not be used as a sole basis for treatment. Nasal washings and aspirates are unacceptable for Xpert Xpress SARS-CoV-2/FLU/RSV testing.  Fact Sheet for Patients: BloggerCourse.com  Fact Sheet for Healthcare Providers: SeriousBroker.it  This test is not yet approved or cleared by the [place] FDA and has been authorized for detection and/or diagnosis of SARS-CoV-2 by FDA under an Emergency Use Authorization (EUA). This EUA will remain in effect (meaning this test can be used) for the duration of the COVID-19 declaration under Section 564(b)(1) of the Act, 21 U.S.C. section 360bbb-3(b)(1), unless the authorization is terminated  or revoked.  Performed at [facility], [address], [place], [place] [address]   Urine Culture     Status: None   Collection Time: 12/23/21  9:47 PM   Specimen: Urine, Random  Result Value Ref Range Status   Specimen Description   Final    URINE, RANDOM Performed at [facility], [address] [place], [place] [address]    Special Requests   Final    NONE Performed at [facility], [address] [place], [place] [address]    Culture   Final    NO GROWTH Performed at [facility], [address] [place], [place] [address]    Report Status 12/25/2021 FINAL  Final  C Difficile Quick Screen w PCR reflex     Status: None   Collection Time: 12/24/21  5:03 AM   Specimen: STOOL  Result Value Ref Range Status   C Diff antigen NEGATIVE NEGATIVE Final   C Diff toxin NEGATIVE NEGATIVE Final   C Diff interpretation No C. difficile detected.  Final    Comment: Performed at [facility], [address] [place], [place] [address]  Gastrointestinal Panel by PCR , Stool     Status: None   Collection Time: 12/24/21  5:03 AM   Specimen: STOOL  Result Value Ref Range Status   Campylobacter species NOT DETECTED NOT DETECTED Final   Plesimonas shigelloides NOT DETECTED NOT DETECTED Final   Salmonella species NOT DETECTED NOT DETECTED Final   Yersinia enterocolitica NOT DETECTED NOT DETECTED Final   Vibrio species NOT DETECTED NOT DETECTED Final   Vibrio cholerae NOT DETECTED NOT DETECTED Final   Enteroaggregative E coli (EAEC) NOT DETECTED NOT DETECTED Final   Enteropathogenic E coli (EPEC) NOT DETECTED NOT DETECTED Final   Enterotoxigenic E coli (ETEC) NOT DETECTED NOT DETECTED Final   Shiga like toxin producing E coli (STEC) NOT DETECTED NOT DETECTED Final   Shigella/Enteroinvasive E coli (EIEC) NOT DETECTED NOT DETECTED Final   Cryptosporidium NOT DETECTED NOT DETECTED Final   Cyclospora cayetanensis NOT DETECTED NOT DETECTED Final    Entamoeba histolytica NOT DETECTED NOT DETECTED Final   Giardia lamblia NOT DETECTED NOT DETECTED Final   Adenovirus F40/41 NOT DETECTED NOT DETECTED Final   Astrovirus NOT DETECTED NOT DETECTED Final   Norovirus GI/GII NOT DETECTED NOT DETECTED Final   Rotavirus A NOT DETECTED NOT DETECTED Final   Sapovirus (I, II, IV, and V) NOT DETECTED NOT DETECTED Final    Comment: Performed at [facility], [address] [place], [place] [address]    Antimicrobials: Anti-infectives (From admission, onward)    Start     Dose/Rate Route Frequency Ordered Stop   12/24/21 0745  cefTRIAXone (ROCEPHIN) 1 g in sodium chloride 0.9 % 100 mL IVPB        1 g 200 mL/hr over 30 Minutes Intravenous Every 24 hours 12/24/21 0731 12/27/21 0744   12/24/21 0015  ceFEPIme (MAXIPIME) 2 g in sodium chloride 0.9 % 100 mL IVPB        2 g 200 mL/hr over 30 Minutes Intravenous  Once 12/24/21 0005 12/24/21 0059      Culture/Microbiology    Component Value Date/Time   SDES  12/23/2021 2147    URINE, RANDOM Performed at [facility], [address] [place], [place] [address]    [facility]  12/23/2021 2147    NONE Performed at [facility], [address] [place], [place] [address]    CULT  12/23/2021 2147    NO GROWTH Performed at [facility], [address] [place], [place] [address]    REPTSTATUS 12/25/2021 FINAL 12/23/2021 2147    Other culture-see note  Radiology Studies: CT ABDOMEN PELVIS W CONTRAST  Result Date: 12/23/2021 CLINICAL DATA:  Diarrhea Abdominal pain, acute, nonlocalized EXAM: CT ABDOMEN AND PELVIS WITH CONTRAST TECHNIQUE: Multidetector CT imaging of the abdomen and pelvis was performed using the standard protocol following bolus administration of intravenous contrast. RADIATION DOSE REDUCTION: This exam was performed according to the departmental dose-optimization program which includes automated exposure control, adjustment of the mA and/or kV  according to patient size and/or use of iterative reconstruction technique. CONTRAST:  17mL OMNIPAQUE IOHEXOL  300 MG/ML  SOLN COMPARISON:  CT abdomen pelvis 02/10/2016 FINDINGS: Lower chest: Right lower lobe subsegmental atelectasis. Coronary calcification. Hepatobiliary: Chronic peripheral 0.7 cm hypodense left hepatic lesion is too small to characterize. No focal liver abnormality. Calcified gallstone noted within the gallbladder lumen. No gallbladder wall thickening or pericholecystic fluid. No biliary dilatation. Pancreas: No focal lesion. Normal pancreatic contour. No surrounding inflammatory changes. No main pancreatic ductal dilatation. Spleen: Normal in size without focal abnormality. A splenule is noted. Adrenals/Urinary Tract: No adrenal nodule bilaterally. Bilateral kidneys enhance symmetrically. There is a 1.1 cm fluid density lesion within the right kidney that likely represents a simple renal cyst. Similar smaller lesions are noted. Likely parapelvic cyst on the left. Subcentimeter hypodensities are too small to characterize. No hydronephrosis. No hydroureter. The urinary bladder is unremarkable. On delayed imaging, there is no urothelial wall thickening and there are no filling defects in the opacified portions of the bilateral collecting systems or ureters. Stomach/Bowel: Hartmann pouch formation. Partial colectomy with a left lower descending end colostomy. Stomach is within normal limits. No evidence of bowel wall thickening or dilatation. Appendix appears normal. Vascular/Lymphatic: No abdominal aorta or iliac aneurysm. Severe mild atherosclerotic plaque of the aorta and its branches. No abdominal, pelvic, or inguinal lymphadenopathy. Reproductive: Status post hysterectomy. No adnexal masses. Other: No intraperitoneal free fluid. No intraperitoneal free gas. No organized fluid collection. Musculoskeletal: No abdominal wall hernia or abnormality. No suspicious lytic or blastic osseous lesions. No  acute displaced fracture. Multilevel degenerative changes of the spine. IMPRESSION: 1. Cholelithiasis. 2. Partial colectomy and Hartmann pouch formation with a left lower descending end colostomy. 3. Hysterectomy. 4.  Aortic Atherosclerosis (ICD10-I70.0). Electronically Signed   By: Tish FredericksonMorgane  Naveau M.D.   On: 12/23/2021 21:55     LOS: 1 day   Lanae Boastamesh Christell Steinmiller, MD [facility]  12/25/2021, 8:48 AM

## 2021-12-25 NOTE — Assessment & Plan Note (Signed)
See above

## 2021-12-26 DIAGNOSIS — E43 Unspecified severe protein-calorie malnutrition: Secondary | ICD-10-CM | POA: Insufficient documentation

## 2021-12-26 MED ORDER — POTASSIUM CHLORIDE CRYS ER 20 MEQ PO TBCR: 20.0000 meq | EXTENDED_RELEASE_TABLET | Freq: Every day | ORAL | 0 refills | Status: AC

## 2021-12-26 MED ORDER — ENSURE ENLIVE PO LIQD: 237.0000 mL | Freq: Three times a day (TID) | ORAL | 12 refills | Status: AC

## 2021-12-26 MED ORDER — ADULT MULTIVITAMIN W/MINERALS CH
1.0000 | ORAL_TABLET | Freq: Every day | ORAL | 0 refills | Status: AC
Start: 2021-12-27 — End: 2022-01-26

## 2021-12-26 MED ORDER — LOPERAMIDE HCL 2 MG PO CAPS: 2.0000 mg | ORAL_CAPSULE | ORAL | 0 refills | Status: AC | PRN

## 2021-12-26 NOTE — Assessment & Plan Note (Signed)
Augment nutritional status.  Overall poor prognosis. ?

## 2021-12-26 NOTE — Discharge Summary (Signed)
Physician Discharge Summary   Patient: Katherine Wood MRN: [id number] DOB: 09/28/1941  Admit date:     12/23/2021  Discharge date: 12/26/21  Discharge Physician: Lanae Boast   PCP: [name], NP   Recommendations at discharge:    Follow up with hospice at Home.  Hospital Course: 81 y.o. f w/ Dementia, partial colectomy with colostomy, recurrent UTI with E. coli 11/16/2021 and Aerococcus 12/07/2021  admitted with a 3-day history of diarrhea/high colostomy output and decreased appetite. History is limited due to dementia. In ED Caregiver at bedside and daughter, [name] on speaker phone. Daughter has arranged hospice for patient when discharged. In ED-vitals normal work-up showed anemia w/ hb-8.9,normal WBC of 8800, K-2.7 with creatinine of 1.01 and bicarb of 14.  UA-large leukocyte esterase and many bacteria.  Troponin and LFTs within normal limits.  Troponin 10.COVID and flu negative. EKG, personally viewed and interpreted with sinus rhythm at 88 with no acute ST-T wave changes.CT abdomen and pelvis showing cholelithiasis, partial colectomy with colostomy otherwise non-acute. Patient was given oral and IV potassium, started on IV antibiotics ivf and admitted.  Patient had a diarrhea in colostomy that was negative for C. difficile and GI panel.  Patient was symptomatically treated with Imodium her bowel output from colostomy significantly improved.  Seen by dietitian for severe protein calorie malnutrition.  Said she had hypokalemia that resolved.  At this time she remains medically stable although overall guarded prognosis palliative care was consulted-but since plan was in place for patient to return home with hospice [facility] consulted to assist as per family wishes.  Patient will complete IV antibiotics prior to discharge.  Urine culture negative.  Discharge Diagnoses: * Diarrhea Colostomy in place with liquidy stool: Poor oral intake: C.difficile and GI panel negative, patient placed on  Imodium scheduled and improved now. Change to prn.Continue supportive care.  Renal function remains a stable.  Encourage oral intake Recent Labs  Lab 12/23/21 1815 12/24/21 0141 12/24/21 0503 12/25/21 0619  BUN 21 17 16 17   CREATININE 1.01* 0.84 0.90 0.85    Colostomy in place [facility]) See above  Hypokalemia Due to #1.  Repleted.Continue potassium chloride supplement at home.  Metabolic acidosis Due to diarrhea.  Bicarb level improved.  DC sodium bicarb   UTI (urinary tract infection) Recent Aerococcus positive.  Urine culture so far negative completing today 3 days of IV antibiotics.    Protein-calorie malnutrition, severe Augment nutritional status.  Overall poor prognosis.  Goals of care, counseling/discussion See above.  Failure to thrive in adult Goals of care: Continue supportive care, palliative care has been consulted-they are unable to see however daughter has already made arrangement for patient to return home with hospice TOC was consulted.  She is stable for discharge spoke with the son and they are planning for return to home today with hospice.  Anemia Anemia of chronic disease, hemoglobin overall stable.  No indication to pursue Recent Labs  Lab 12/23/21 1815 12/24/21 0503  HGB 8.9* 8.7*  HCT 26.8* 27.2*    Alzheimer's dementia (HCC) Continue supportive care delirium precaution, continue home Remeron, Zoloft and Xanax as needed.Daughter 02/23/22 plans to take patient home with hospice as she is declining and not eating          Consultants: TOC Procedures performed: NONE  Disposition: Home Diet recommendation:  Diet Orders (From admission, onward)     Start     Ordered   12/25/21 1629  Diet regular Room service appropriate? Yes; Fluid consistency: Thin  Diet effective now       Question Answer Comment  Room service appropriate? Yes   Fluid consistency: Thin      12/25/21 1628             DISCHARGE MEDICATION: Allergies as of 12/26/2021        Reactions   Alendronate Sodium Other (See Comments)   Joint pains Joint pains   Galantamine Other (See Comments)        Medication List     STOP taking these medications    nitrofurantoin 25 MG/5ML suspension Commonly known as: FURADANTIN       TAKE these medications    ALPRAZolam 0.25 MG tablet Commonly known as: XANAX Take 1 tablet (0.25 mg total) by mouth 2 (two) times daily as needed for anxiety.   CALCIUM 600 PO Take 900 mg by mouth daily.   feeding supplement Liqd Take 237 mLs by mouth 3 (three) times daily between meals for 4 days.   ferrous sulfate 325 (65 FE) MG EC tablet Take 325 mg by mouth daily.   loperamide 2 MG capsule Commonly known as: IMODIUM Take 1 capsule (2 mg total) by mouth as needed for up to 30 doses for diarrhea or loose stools.   mirtazapine 7.5 MG tablet Commonly known as: REMERON Take 1 tablet (7.5 mg total) by mouth at bedtime.   multivitamin with minerals Tabs tablet Take 1 tablet by mouth daily. Start taking on: December 27, 2021   potassium chloride SA 20 MEQ tablet Commonly known as: KLOR-CON M Take 1 tablet (20 mEq total) by mouth daily for 4 days. Start taking on: December 27, 2021   sertraline 50 MG tablet Commonly known as: ZOLOFT Take 1 tablet (50 mg total) by mouth daily.   Vitamin D3 125 MCG (5000 UT) Caps Take 5,000 Units by mouth daily.   vitamin E 180 MG (400 UNITS) capsule Take 1,000 Units by mouth daily.        Follow-up Information     [name], NP Follow up in 1 day(s).   Specialty: Nurse Practitioner Why: For initiation of hospice at home upon discharge Contact information: [address] [place] [address] [phone]                 Discharge Exam: Ceasar Mons Weights   12/23/21 1811  Weight: 54.3 kg   Condition at discharge: fair  The results of significant diagnostics from this hospitalization (including imaging, microbiology, ancillary and laboratory) are listed  below for reference.   Imaging Studies: CT ABDOMEN PELVIS W CONTRAST  Result Date: 12/23/2021 CLINICAL DATA:  Diarrhea Abdominal pain, acute, nonlocalized EXAM: CT ABDOMEN AND PELVIS WITH CONTRAST TECHNIQUE: Multidetector CT imaging of the abdomen and pelvis was performed using the standard protocol following bolus administration of intravenous contrast. RADIATION DOSE REDUCTION: This exam was performed according to the departmental dose-optimization program which includes automated exposure control, adjustment of the mA and/or kV according to patient size and/or use of iterative reconstruction technique. CONTRAST:  46mL OMNIPAQUE IOHEXOL 300 MG/ML  SOLN COMPARISON:  CT abdomen pelvis 02/10/2016 FINDINGS: Lower chest: Right lower lobe subsegmental atelectasis. Coronary calcification. Hepatobiliary: Chronic peripheral 0.7 cm hypodense left hepatic lesion is too small to characterize. No focal liver abnormality. Calcified gallstone noted within the gallbladder lumen. No gallbladder wall thickening or pericholecystic fluid. No biliary dilatation. Pancreas: No focal lesion. Normal pancreatic contour. No surrounding inflammatory changes. No main pancreatic ductal dilatation. Spleen: Normal in size without focal abnormality. A  splenule is noted. Adrenals/Urinary Tract: No adrenal nodule bilaterally. Bilateral kidneys enhance symmetrically. There is a 1.1 cm fluid density lesion within the right kidney that likely represents a simple renal cyst. Similar smaller lesions are noted. Likely parapelvic cyst on the left. Subcentimeter hypodensities are too small to characterize. No hydronephrosis. No hydroureter. The urinary bladder is unremarkable. On delayed imaging, there is no urothelial wall thickening and there are no filling defects in the opacified portions of the bilateral collecting systems or ureters. Stomach/Bowel: Hartmann pouch formation. Partial colectomy with a left lower descending end colostomy. Stomach is  within normal limits. No evidence of bowel wall thickening or dilatation. Appendix appears normal. Vascular/Lymphatic: No abdominal aorta or iliac aneurysm. Severe mild atherosclerotic plaque of the aorta and its branches. No abdominal, pelvic, or inguinal lymphadenopathy. Reproductive: Status post hysterectomy. No adnexal masses. Other: No intraperitoneal free fluid. No intraperitoneal free gas. No organized fluid collection. Musculoskeletal: No abdominal wall hernia or abnormality. No suspicious lytic or blastic osseous lesions. No acute displaced fracture. Multilevel degenerative changes of the spine. IMPRESSION: 1. Cholelithiasis. 2. Partial colectomy and Hartmann pouch formation with a left lower descending end colostomy. 3. Hysterectomy. 4.  Aortic Atherosclerosis (ICD10-I70.0). Electronically Signed   By: Tish Frederickson M.D.   On: 12/23/2021 21:55    Microbiology: Results for orders placed or performed during the hospital encounter of 12/23/21  Resp Panel by RT-PCR (Flu A&B, Covid) Nasopharyngeal Swab     Status: None   Collection Time: 12/23/21  6:15 PM   Specimen: Nasopharyngeal Swab; Nasopharyngeal(NP) swabs in vial transport medium  Result Value Ref Range Status   SARS Coronavirus 2 by RT PCR NEGATIVE NEGATIVE Final    Comment: (NOTE) SARS-CoV-2 target nucleic acids are NOT DETECTED.  The SARS-CoV-2 RNA is generally detectable in upper respiratory specimens during the acute phase of infection. The lowest concentration of SARS-CoV-2 viral copies this assay can detect is 138 copies/mL. A negative result does not preclude SARS-Cov-2 infection and should not be used as the sole basis for treatment or other patient management decisions. A negative result may occur with  improper specimen collection/handling, submission of specimen other than nasopharyngeal swab, presence of viral mutation(s) within the areas targeted by this assay, and inadequate number of viral copies(<138 copies/mL). A  negative result must be combined with clinical observations, patient history, and epidemiological information. The expected result is Negative.  Fact Sheet for Patients:  BloggerCourse.com  Fact Sheet for Healthcare Providers:  SeriousBroker.it  This test is no t yet approved or cleared by the [place] FDA and  has been authorized for detection and/or diagnosis of SARS-CoV-2 by FDA under an Emergency Use Authorization (EUA). This EUA will remain  in effect (meaning this test can be used) for the duration of the COVID-19 declaration under Section 564(b)(1) of the Act, 21 U.S.C.section 360bbb-3(b)(1), unless the authorization is terminated  or revoked sooner.       Influenza A by PCR NEGATIVE NEGATIVE Final   Influenza B by PCR NEGATIVE NEGATIVE Final    Comment: (NOTE) The Xpert Xpress SARS-CoV-2/FLU/RSV plus assay is intended as an aid in the diagnosis of influenza from Nasopharyngeal swab specimens and should not be used as a sole basis for treatment. Nasal washings and aspirates are unacceptable for Xpert Xpress SARS-CoV-2/FLU/RSV testing.  Fact Sheet for Patients: BloggerCourse.com  Fact Sheet for Healthcare Providers: SeriousBroker.it  This test is not yet approved or cleared by the [place] FDA and has been authorized for detection and/or  diagnosis of SARS-CoV-2 by FDA under an Emergency Use Authorization (EUA). This EUA will remain in effect (meaning this test can be used) for the duration of the COVID-19 declaration under Section 564(b)(1) of the Act, 21 U.S.C. section 360bbb-3(b)(1), unless the authorization is terminated or revoked.  Performed at [facility], [address] [place], [place] [address]   Urine Culture     Status: None   Collection Time: 12/23/21  9:47 PM   Specimen: Urine, Random  Result Value Ref Range Status   Specimen  Description   Final    URINE, RANDOM Performed at [facility], [address] [place], [place] [address]    Special Requests   Final    NONE Performed at [facility], [address] [place], [place] [address]    Culture   Final    NO GROWTH Performed at [facility], [address] [place], [place] [address]    Report Status 12/25/2021 FINAL  Final  C Difficile Quick Screen w PCR reflex     Status: None   Collection Time: 12/24/21  5:03 AM   Specimen: STOOL  Result Value Ref Range Status   C Diff antigen NEGATIVE NEGATIVE Final   C Diff toxin NEGATIVE NEGATIVE Final   C Diff interpretation No C. difficile detected.  Final    Comment: Performed at [facility], [address] [place], [place] [address]  Gastrointestinal Panel by PCR , Stool     Status: None   Collection Time: 12/24/21  5:03 AM   Specimen: STOOL  Result Value Ref Range Status   Campylobacter species NOT DETECTED NOT DETECTED Final   Plesimonas shigelloides NOT DETECTED NOT DETECTED Final   Salmonella species NOT DETECTED NOT DETECTED Final   Yersinia enterocolitica NOT DETECTED NOT DETECTED Final   Vibrio species NOT DETECTED NOT DETECTED Final   Vibrio cholerae NOT DETECTED NOT DETECTED Final   Enteroaggregative E coli (EAEC) NOT DETECTED NOT DETECTED Final   Enteropathogenic E coli (EPEC) NOT DETECTED NOT DETECTED Final   Enterotoxigenic E coli (ETEC) NOT DETECTED NOT DETECTED Final   Shiga like toxin producing E coli (STEC) NOT DETECTED NOT DETECTED Final   Shigella/Enteroinvasive E coli (EIEC) NOT DETECTED NOT DETECTED Final   Cryptosporidium NOT DETECTED NOT DETECTED Final   Cyclospora cayetanensis NOT DETECTED NOT DETECTED Final   Entamoeba histolytica NOT DETECTED NOT DETECTED Final   Giardia lamblia NOT DETECTED NOT DETECTED Final   Adenovirus F40/41 NOT DETECTED NOT DETECTED Final   Astrovirus NOT DETECTED NOT DETECTED Final   Norovirus GI/GII NOT DETECTED NOT  DETECTED Final   Rotavirus A NOT DETECTED NOT DETECTED Final   Sapovirus (I, II, IV, and V) NOT DETECTED NOT DETECTED Final    Comment: Performed at [facility], [address] [place], [place] [address]    Labs: CBC: Recent Labs  Lab 12/23/21 1815 12/24/21 0503  WBC 8.8 7.4  NEUTROABS 6.8  --   HGB 8.9* 8.7*  HCT 26.8* 27.2*  MCV 92.7 96.8  PLT 431* 404*   Basic Metabolic Panel: Recent Labs  Lab 12/23/21 1815 12/24/21 0141 12/24/21 0503 12/25/21 0619 12/25/21 1513  NA 136 135 138 141  --   K 2.7* 3.5 3.2* 2.9* 3.3*  CL 105 113* 113* 113*  --   CO2 14* 16* 15* 19*  --   GLUCOSE 105* 119* 109* 106*  --   BUN 21 17 16 17   --  CREATININE 1.01* 0.84 0.90 0.85  --   CALCIUM 9.4 8.4* 8.8* 8.7*  --   MG 1.8  --   --   --   --    Liver Function Tests: Recent Labs  Lab 12/23/21 1815  AST 18  ALT 9  ALKPHOS 64  BILITOT 0.6  PROT 7.2  ALBUMIN 3.3*   CBG: No results for input(s): GLUCAP in the last 168 hours.  Discharge time spent: 35 minutes.  Signed: Lanae Boastamesh Kadejah Sandiford, MD [facility] 12/26/2021

## 2021-12-28 ENCOUNTER — Telehealth: Payer: Self-pay | Admitting: Nurse Practitioner

## 2021-12-28 NOTE — Telephone Encounter (Signed)
Thomasenia Sales RN calling from [facility] is calling because pt has appt on Friday. Thomasenia Sales is request a face to face for an order to insurance for a wheelchair. ?Lorain Childes is in enrolled palliative care. ? ?CB- [phone] ?

## 2022-01-01 ENCOUNTER — Ambulatory Visit: Payer: Medicare PPO | Admitting: Nurse Practitioner

## 2022-01-04 ENCOUNTER — Telehealth: Payer: Self-pay | Admitting: Nurse Practitioner

## 2022-01-04 NOTE — Telephone Encounter (Signed)
Copied from CRM [id number]. Topic: General - Other ?>> Jan 01, 2022  4:37 PM Katherine Wood, Katherine Wood wrote: ?Reason for HTD:SKAJGO from [facility] requesting resumption of care be faxed to [phone], ph#. [phone] ?

## 2022-01-04 NOTE — Telephone Encounter (Signed)
Spoke with Merleen Nicely to inform her that our office has not received any paperwork regarding the patient. Merleen Nicely says she did not see a name or any information on the patient. Merleen Nicely said she will have to give our office a call back once she has made it back to her laptop to gather more information. Verbalized understanding. ?

## 2022-01-08 NOTE — Telephone Encounter (Signed)
Tresa Endo called back in stating if PCP could write pts resumption of care and fax to [phone], [phone]. ?

## 2022-01-08 NOTE — Telephone Encounter (Signed)
Spoke with Katherine Wood to gather more information on what she needs because we have not received any paperwork regarding this. She states she will get them to fax something over for [name] to sign. ?

## 2022-01-11 ENCOUNTER — Telehealth: Payer: Self-pay | Admitting: Nurse Practitioner

## 2022-01-11 ENCOUNTER — Other Ambulatory Visit: Payer: Self-pay

## 2022-01-11 ENCOUNTER — Other Ambulatory Visit: Payer: Self-pay | Admitting: Student

## 2022-01-11 DIAGNOSIS — Z515 Encounter for palliative care: Secondary | ICD-10-CM

## 2022-01-11 DIAGNOSIS — F0284 Dementia in other diseases classified elsewhere, unspecified severity, with anxiety: Secondary | ICD-10-CM

## 2022-01-11 DIAGNOSIS — R197 Diarrhea, unspecified: Secondary | ICD-10-CM

## 2022-01-11 DIAGNOSIS — E43 Unspecified severe protein-calorie malnutrition: Secondary | ICD-10-CM

## 2022-01-11 NOTE — Progress Notes (Signed)
? ? ?Manufacturing engineer ?Community Palliative Care Consult Note ?Telephone: [phone]  ?Fax: [phone]  ? ? ?Date of encounter: 01/11/22 ?2:06 PM ?PATIENT NAME: Katherine Wood ?[place] [address]   ?[phone] (home)  ?DOB: [birth year] ?MRN: [id number] ?PRIMARY CARE PROVIDER:    ?Jon Billings, NP,  ?[address] ?[place] [address] ?[phone] ? ?REFERRING PROVIDER:   ?Jon Billings, NP ?[address] ?[place],  [place] [address] ?[phone] ? ?RESPONSIBLE PARTY:    ?Contact Information   ? ? Name Relation Home Work Mobile  ? Miyanna, Wiersma Son   [phone]  ? Cassidee, Deats Daughter   [phone]  ? ?  ? ? ? ?I met face to face with patient and family in the home. Palliative Care was asked to follow this patient by consultation request of  Jon Billings, NP to address advance care planning and complex medical decision making. This is a follow up visit. ? ?                                 ASSESSMENT AND PLAN / RECOMMENDATIONS:  ? ?Advance Care Planning/Goals of Care: Goals include to maximize quality of life and symptom management. Patient/health care surrogate gave his/her permission to discuss. ?Our advance care planning conversation included a discussion about:    ?The value and importance of advance care planning  ?Experiences with loved ones who have been seriously ill or have died  ?Exploration of personal, cultural or spiritual beliefs that might influence medical decisions  ?Exploration of goals of care in the event of a sudden injury or illness  ?CODE STATUS: DNR ? ?Education on Palliative Medicine vs. Hospice services. Family expresses patient being comfortable, no aggressive treatments and to be managed in the home. Given patient's functional decline and weight loss, hospice medical directors in agreement with hospice evaluation. Left message at PCP office to request hospice order and if K. Mathis Dad, NP will serve as hospice attending.   ? ?Symptom Management/Plan: ? ?Alzheimer's dementia-Fast score 7C. Patient with functional decline since visit last month. She is no longer ambulating. Appetite has declined; eating bites. Weight loss noted. She is sleeping throughout the day. Continue to reorient and redirect as needed. Monitor for falls/safety. Monitor for aspiration due to her worsening dysphagia. Patient with anxiety-continue sertraline, mirtazapine and PRN xanax as directed.  ? ?Protein calorie malnutrition-last albumin 3.3. patient eating bites, weight loss noted in spite of supplements and mirtazapine. She is being referred for hospice evaluation. Encourage foods patient enjoys.  ? ?Diarrhea-continue imodium PRN. ? ?Follow up Palliative Care Visit: Palliative care will continue to follow for complex medical decision making, advance care planning, and clarification of goals. Return prn. ? ? ?This visit was coded based on medical decision making (MDM). ? ?PPS: 30%, weak.  ? ?HOSPICE ELIGIBILITY/DIAGNOSIS: TBD ? ?Chief Complaint: Palliative Medicine follow up visit.  ? ?HISTORY OF PRESENT ILLNESS:  Katherine Wood is a 81 y.o. year old female  with Alzheimer's dementia, anxiety, and osteoporosis, partial colectomy with colostomy. Patient hospitalized 3/1-12/26/21 due to diarrhea/high colostomy output, recurrent UTI with E. Coli, decreased appetite.  ? ?Patient with continued decline since returning home. Sleeping more throughout the day, at least 18 hours a day. Down to eating bites, drinking one shake a day. Worsening dysphagia, difficulty swallowing, coughing with liquids and solids. Incontinent of Bowel and Bladder; occasional diarrhea again. Weight went from 133 pounds 11/2021  to 119 pounds on 12/23/21. Her clothes are fitting much looser. Last albumin 3.3. Her PPS went from 50% in February, now a 30%, weak. Patient with recurrent UTI?s. Patient is a DNR; family wishes for comfort, no aggressive treatment, manage in the home. HPI and ROS  obtained from family due to patient's dementia ? ?History obtained from review of EMR, discussion with primary team, and interview with family, facility staff/caregiver and/or Ms. Jocelyn.  ?I reviewed available labs, medications, imaging, studies and related documents from the EMR.  Records reviewed and summarized above.  ? ? ?Physical Exam: ?Pule 72, resp 22, b/p 120/68, sats 97% on room air ?Constitutional: NAD ?General: frail appearing, thin ?EYES: anicteric sclera, lids intact, no discharge  ?ENMT: intact hearing, oral mucous membranes moist, dentition intact ?CV: S1S2, RRR, no LE edema ?Pulmonary: LCTA, no increased work of breathing, no cough, room air ?Abdomen: normo-active BS + 4 quadrants, soft and non tender, colostomy left abdomen ?GU: deferred ?MSK: +sarcopenia, non-ambulatory ?Skin: warm and dry, no rashes or wounds on visible skin ?Neuro: + generalized weakness, Alert and oriented to person ?Psych: anxious affect ?Hem/lymph/immuno: no widespread bruising ? ? ?Thank you for the opportunity to participate in the care of Ms. Ende.  The palliative care team will continue to follow. Please call our office at [phone] if we can be of additional assistance.  ? ?Ezekiel Slocumb, NP  ? ?COVID-19 PATIENT SCREENING TOOL ?Asked and negative response unless otherwise noted:  ? ?Have you had symptoms of covid, tested positive or been in contact with someone with symptoms/positive test in the past 5-10 days? No ? ?

## 2022-01-11 NOTE — Telephone Encounter (Signed)
Copied from [place] [id number]. Topic: General - Other ?>> Jan 11, 2022  3:25 PM Yvette Rack wrote: ?Reason for CRM: Charlann Boxer, NP called to request an order for hospice evaluation to be faxed to [phone]. Latoya also asked if Jon Billings will service hospice attending. Cb# [phone] ?

## 2022-01-12 ENCOUNTER — Encounter: Payer: Self-pay | Admitting: Nurse Practitioner

## 2022-01-12 NOTE — Telephone Encounter (Signed)
Left message for [facility], NP from [facility] and Palliative Care to provide OK verbal orders for patient. Advised to give our office a call back if she has any questions or concerns.  ?

## 2022-01-12 NOTE — Telephone Encounter (Signed)
Okay to give verbal order.  Yes I will be the attending.  Please ask what they need from me.  Also ask what company Glee Arvin is calling from. ?

## 2022-01-15 ENCOUNTER — Ambulatory Visit: Payer: Medicare PPO | Admitting: Nurse Practitioner

## 2022-01-15 NOTE — Progress Notes (Deleted)
? ?There were no vitals taken for this visit.  ? ?Subjective:  ? ? Patient ID: Katherine Wood, female    DOB: 19-Jan-1941, 81 y.o.   MRN: [id number] ? ?HPI: ?Katherine Wood is a 81 y.o. female ? ?No chief complaint on file. ? ?MEMORY ?Patient's son states patient is talking less than when she was here last.  Her appetite has decreased.  She won't go to the bathroom by herself.  She is using depends.  They saw Neurology who recommended Remeron.   ? ? ?MOOD ?Patient's son is continuing to give her the Zoloft.  He only uses the Xanax when he has a hard time calming her down.  This usually is in the mornings when she is very disoriented.   ? ?[facility] are working with [facility] and [facility].   ? ?Denies other concerns at visit today.  ? ?Relevant past medical, surgical, family and social history reviewed and updated as indicated. Interim medical history since our last visit reviewed. ?Allergies and medications reviewed and updated. ? ?Review of Systems  ?Constitutional:  Positive for appetite change and unexpected weight change.  ?Psychiatric/Behavioral:  Positive for behavioral problems and confusion. Negative for dysphoric mood. The patient is not nervous/anxious.   ? ?Per HPI unless specifically indicated above ? ?   ?Objective:  ?  ?There were no vitals taken for this visit.  ?Wt Readings from Last 3 Encounters:  ?12/23/21 119 lb 11.4 oz (54.3 kg)  ?12/04/21 119 lb 9.6 oz (54.3 kg)  ?10/29/21 133 lb 12.8 oz (60.7 kg)  ?  ?Physical Exam ?Vitals and nursing note reviewed.  ?Constitutional:   ?   General: She is not in acute distress. ?   Appearance: Normal appearance. She is not ill-appearing, toxic-appearing or diaphoretic.  ?HENT:  ?   Head: Normocephalic.  ?   Right Ear: External ear normal.  ?   Left Ear: External ear normal.  ?   Nose: Nose normal.  ?   Mouth/Throat:  ?   Mouth: Mucous membranes are moist.  ?   Pharynx: Oropharynx is clear.  ?Eyes:  ?   General:     ?   Right eye: No discharge.     ?    Left eye: No discharge.  ?   Extraocular Movements: Extraocular movements intact.  ?   Conjunctiva/sclera: Conjunctivae normal.  ?   Pupils: Pupils are equal, round, and reactive to light.  ?Cardiovascular:  ?   Rate and Rhythm: Normal rate and regular rhythm.  ?   Heart sounds: No murmur heard. ?Pulmonary:  ?   Effort: Pulmonary effort is normal. No respiratory distress.  ?   Breath sounds: Normal breath sounds. No wheezing or rales.  ?Musculoskeletal:  ?   Cervical back: Normal range of motion and neck supple.  ?Skin: ?   General: Skin is warm and dry.  ?   Capillary Refill: Capillary refill takes less than 2 seconds.  ?Neurological:  ?   General: No focal deficit present.  ?   Mental Status: She is alert and oriented to person, place, and time. Mental status is at baseline.  ?Psychiatric:     ?   Mood and Affect: Mood normal.     ?   Behavior: Behavior normal.     ?   Thought Content: Thought content normal.     ?   Judgment: Judgment normal.  ? ? ?Results for orders placed or performed during the hospital encounter of  12/23/21  ?Resp Panel by RT-PCR (Flu A&B, Covid) Nasopharyngeal Swab  ? Specimen: Nasopharyngeal Swab; Nasopharyngeal(NP) swabs in vial transport medium  ?Result Value Ref Range  ? SARS Coronavirus 2 by RT PCR NEGATIVE NEGATIVE  ? Influenza A by PCR NEGATIVE NEGATIVE  ? Influenza B by PCR NEGATIVE NEGATIVE  ?Urine Culture  ? Specimen: Urine, Random  ?Result Value Ref Range  ? Specimen Description    ?  URINE, RANDOM ?Performed at [facility], [address] [place], [place] [address] ?  ? Special Requests    ?  NONE ?Performed at [facility], [address], [place], [place] [address] ?  ? Culture    ?  NO GROWTH ?Performed at [facility], [place] [address] [place], [place] [address] ?  ? Report Status 12/25/2021 FINAL   ?C Difficile Quick Screen w PCR reflex  ? Specimen: STOOL  ?Result Value Ref Range  ? C Diff antigen NEGATIVE NEGATIVE  ? C Diff toxin NEGATIVE NEGATIVE   ? C Diff interpretation No C. difficile detected.   ?Gastrointestinal Panel by PCR , Stool  ? Specimen: STOOL  ?Result Value Ref Range  ? Campylobacter species NOT DETECTED NOT DETECTED  ? Plesimonas shigelloides NOT DETECTED NOT DETECTED  ? Salmonella species NOT DETECTED NOT DETECTED  ? Yersinia enterocolitica NOT DETECTED NOT DETECTED  ? Vibrio species NOT DETECTED NOT DETECTED  ? Vibrio cholerae NOT DETECTED NOT DETECTED  ? Enteroaggregative E coli (EAEC) NOT DETECTED NOT DETECTED  ? Enteropathogenic E coli (EPEC) NOT DETECTED NOT DETECTED  ? Enterotoxigenic E coli (ETEC) NOT DETECTED NOT DETECTED  ? Shiga like toxin producing E coli (STEC) NOT DETECTED NOT DETECTED  ? Shigella/Enteroinvasive E coli (EIEC) NOT DETECTED NOT DETECTED  ? Cryptosporidium NOT DETECTED NOT DETECTED  ? Cyclospora cayetanensis NOT DETECTED NOT DETECTED  ? Entamoeba histolytica NOT DETECTED NOT DETECTED  ? Giardia lamblia NOT DETECTED NOT DETECTED  ? Adenovirus F40/41 NOT DETECTED NOT DETECTED  ? Astrovirus NOT DETECTED NOT DETECTED  ? Norovirus GI/GII NOT DETECTED NOT DETECTED  ? Rotavirus A NOT DETECTED NOT DETECTED  ? Sapovirus (I, II, IV, and V) NOT DETECTED NOT DETECTED  ?CBC with Differential  ?Result Value Ref Range  ? WBC 8.8 4.0 - 10.5 K/uL  ? RBC 2.89 (L) 3.87 - 5.11 MIL/uL  ? Hemoglobin 8.9 (L) 12.0 - 15.0 g/dL  ? HCT 26.8 (L) 36.0 - 46.0 %  ? MCV 92.7 80.0 - 100.0 fL  ? MCH 30.8 26.0 - 34.0 pg  ? MCHC 33.2 30.0 - 36.0 g/dL  ? RDW 16.2 (H) 11.5 - 15.5 %  ? Platelets 431 (H) 150 - 400 K/uL  ? nRBC 0.5 (H) 0.0 - 0.2 %  ? Neutrophils Relative % 77 %  ? Neutro Abs 6.8 1.7 - 7.7 K/uL  ? Lymphocytes Relative 11 %  ? Lymphs Abs 1.0 0.7 - 4.0 K/uL  ? Monocytes Relative 9 %  ? Monocytes Absolute 0.8 0.1 - 1.0 K/uL  ? Eosinophils Relative 1 %  ? Eosinophils Absolute 0.1 0.0 - 0.5 K/uL  ? Basophils Relative 1 %  ? Basophils Absolute 0.1 0.0 - 0.1 K/uL  ? Immature Granulocytes 1 %  ? Abs Immature Granulocytes 0.12 (H) 0.00 - 0.07 K/uL   ?Comprehensive metabolic panel  ?Result Value Ref Range  ? Sodium 136 135 - 145 mmol/L  ? Potassium 2.7 (LL) 3.5 - 5.1 mmol/L  ? Chloride 105 98 - 111 mmol/L  ?  CO2 14 (L) 22 - 32 mmol/L  ? Glucose, Bld 105 (H) 70 - 99 mg/dL  ? BUN 21 8 - 23 mg/dL  ? Creatinine, Ser 1.01 (H) 0.44 - 1.00 mg/dL  ? Calcium 9.4 8.9 - 10.3 mg/dL  ? Total Protein 7.2 6.5 - 8.1 g/dL  ? Albumin 3.3 (L) 3.5 - 5.0 g/dL  ? AST 18 15 - 41 U/L  ? ALT 9 0 - 44 U/L  ? Alkaline Phosphatase 64 38 - 126 U/L  ? Total Bilirubin 0.6 0.3 - 1.2 mg/dL  ? GFR, Estimated 57 (L) >60 mL/min  ? Anion gap 17 (H) 5 - 15  ?Urinalysis, Routine w reflex microscopic Urine, In & Out Cath  ?Result Value Ref Range  ? Color, Urine YELLOW (A) YELLOW  ? APPearance CLOUDY (A) CLEAR  ? Specific Gravity, Urine 1.017 1.005 - 1.030  ? pH 5.0 5.0 - 8.0  ? Glucose, UA NEGATIVE NEGATIVE mg/dL  ? Hgb urine dipstick NEGATIVE NEGATIVE  ? Bilirubin Urine NEGATIVE NEGATIVE  ? Ketones, ur NEGATIVE NEGATIVE mg/dL  ? Protein, ur NEGATIVE NEGATIVE mg/dL  ? Nitrite NEGATIVE NEGATIVE  ? Leukocytes,Ua LARGE (A) NEGATIVE  ? RBC / HPF 0-5 0 - 5 RBC/hpf  ? WBC, UA 11-20 0 - 5 WBC/hpf  ? Bacteria, UA MANY (A) NONE SEEN  ? Squamous Epithelial / LPF 0-5 0 - 5  ? Mucus PRESENT   ?Lipase, blood  ?Result Value Ref Range  ? Lipase 38 11 - 51 U/L  ?Magnesium  ?Result Value Ref Range  ? Magnesium 1.8 1.7 - 2.4 mg/dL  ?Basic metabolic panel  ?Result Value Ref Range  ? Sodium 135 135 - 145 mmol/L  ? Potassium 3.5 3.5 - 5.1 mmol/L  ? Chloride 113 (H) 98 - 111 mmol/L  ? CO2 16 (L) 22 - 32 mmol/L  ? Glucose, Bld 119 (H) 70 - 99 mg/dL  ? BUN 17 8 - 23 mg/dL  ? Creatinine, Ser 0.84 0.44 - 1.00 mg/dL  ? Calcium 8.4 (L) 8.9 - 10.3 mg/dL  ? GFR, Estimated >60 >60 mL/min  ? Anion gap 6 5 - 15  ?Basic metabolic panel  ?Result Value Ref Range  ? Sodium 138 135 - 145 mmol/L  ? Potassium 3.2 (L) 3.5 - 5.1 mmol/L  ? Chloride 113 (H) 98 - 111 mmol/L  ? CO2 15 (L) 22 - 32 mmol/L  ? Glucose, Bld 109 (H) 70 - 99 mg/dL  ?  BUN 16 8 - 23 mg/dL  ? Creatinine, Ser 0.90 0.44 - 1.00 mg/dL  ? Calcium 8.8 (L) 8.9 - 10.3 mg/dL  ? GFR, Estimated >60 >60 mL/min  ? Anion gap 10 5 - 15  ?CBC  ?Result Value Ref Range  ? WBC 7.4 4.0 - 10

## 2022-01-19 ENCOUNTER — Other Ambulatory Visit: Payer: Self-pay | Admitting: Nurse Practitioner

## 2022-01-20 MED ORDER — ALPRAZOLAM 0.25 MG PO TABS: 0.2500 mg | ORAL_TABLET | Freq: Two times a day (BID) | ORAL | 0 refills | Status: DC | PRN

## 2022-02-04 ENCOUNTER — Ambulatory Visit: Payer: Self-pay

## 2022-02-04 MED ORDER — ALPRAZOLAM 0.5 MG PO TABS: 0.5000 mg | ORAL_TABLET | Freq: Two times a day (BID) | ORAL | 2 refills | Status: AC | PRN

## 2022-02-04 NOTE — Telephone Encounter (Signed)
Called and left Anderson Malta a detailed VM letting her know Dr. Durenda Age message. Asked for her to call back with any questions or concerns.  ?

## 2022-02-04 NOTE — Addendum Note (Signed)
Addended by: Dorcas Carrow on: 02/04/2022 03:26 PM ? ? Modules accepted: Orders ? ?

## 2022-02-04 NOTE — Telephone Encounter (Signed)
? ? ?  Chief Complaint: Pt needs medication dosage increase or change ?Symptoms: Frequency: Pt has anxiety and is frightened. ?Pertinent Negatives: Patient denies  ?Disposition: [] ED /[] Urgent Care (no appt availability in office) / [] Appointment(In office/virtual)/ []  [facility]/ [] Home Care/ [] Refused Recommended Disposition /[] [facility]/ []  Follow-up with PCP ?Additional Notes: per hospice nurse - the current dose of Xanax is not working for the pt. Per the pt has anxiety and is frightened. Nurse is requesting a higher dosage or change of medication for pt. [phone] ? ? ?Reason for Disposition ? [1] Prescription refill request for ESSENTIAL medicine (i.e., likelihood of harm to patient if not taken) AND [2] triager unable to refill per department policy ? ?Answer Assessment - Initial Assessment Questions ?1. DRUG NAME: "What medicine do you need to have refilled?" ?    Xanax 0.25 ?2. REFILLS REMAINING: "How many refills are remaining?" (Note: The label on the medicine or pill bottle will show how many refills are remaining. If there are no refills remaining, then a renewal may be needed.) ?     ?3. EXPIRATION DATE: "What is the expiration date?" (Note: The label states when the prescription will expire, and thus can no longer be refilled.) ?     ?4. PRESCRIBING HCP: "Who prescribed it?" Reason: If prescribed by specialist, call should be referred to that group. ?     ?5. SYMPTOMS: "Do you have any symptoms?" ?     ?6. PREGNANCY: "Is there any chance that you are pregnant?" "When was your last menstrual period?" ? ?Protocols used: Medication Refill and Renewal Call-A-AH ? ?

## 2022-02-04 NOTE — Telephone Encounter (Signed)
OK to give her 2 of her xanax until new Rx goes to her pharmacy ?

## 2022-02-10 ENCOUNTER — Emergency Department
Admission: EM | Admit: 2022-02-10 | Discharge: 2022-02-11 | Disposition: A | Discharge status: Home / Self Care | Attending: Emergency Medicine | Admitting: Emergency Medicine

## 2022-02-10 ENCOUNTER — Emergency Department

## 2022-02-10 DIAGNOSIS — R609 Edema, unspecified: Secondary | ICD-10-CM | POA: Diagnosis not present

## 2022-02-10 DIAGNOSIS — F0390 Unspecified dementia without behavioral disturbance: Secondary | ICD-10-CM | POA: Insufficient documentation

## 2022-02-10 DIAGNOSIS — R918 Other nonspecific abnormal finding of lung field: Secondary | ICD-10-CM | POA: Diagnosis not present

## 2022-02-10 DIAGNOSIS — R52 Pain, unspecified: Secondary | ICD-10-CM | POA: Diagnosis not present

## 2022-02-10 DIAGNOSIS — S8001XA Contusion of right knee, initial encounter: Secondary | ICD-10-CM | POA: Diagnosis not present

## 2022-02-10 DIAGNOSIS — W19XXXA Unspecified fall, initial encounter: Secondary | ICD-10-CM

## 2022-02-10 DIAGNOSIS — M25462 Effusion, left knee: Secondary | ICD-10-CM | POA: Diagnosis not present

## 2022-02-10 DIAGNOSIS — S79002A Unspecified physeal fracture of upper end of left femur, initial encounter for closed fracture: Secondary | ICD-10-CM | POA: Diagnosis not present

## 2022-02-10 DIAGNOSIS — R000 Tachycardia, unspecified: Secondary | ICD-10-CM | POA: Diagnosis not present

## 2022-02-10 DIAGNOSIS — R296 Repeated falls: Secondary | ICD-10-CM | POA: Diagnosis not present

## 2022-02-10 DIAGNOSIS — Y92129 Unspecified place in nursing home as the place of occurrence of the external cause: Secondary | ICD-10-CM | POA: Insufficient documentation

## 2022-02-10 DIAGNOSIS — R4182 Altered mental status, unspecified: Secondary | ICD-10-CM | POA: Diagnosis present

## 2022-02-10 DIAGNOSIS — S72002A Fracture of unspecified part of neck of left femur, initial encounter for closed fracture: Secondary | ICD-10-CM | POA: Diagnosis not present

## 2022-02-10 DIAGNOSIS — Z043 Encounter for examination and observation following other accident: Secondary | ICD-10-CM | POA: Diagnosis not present

## 2022-02-10 DIAGNOSIS — W01198A Fall on same level from slipping, tripping and stumbling with subsequent striking against other object, initial encounter: Secondary | ICD-10-CM | POA: Insufficient documentation

## 2022-02-10 MED ORDER — OXYCODONE HCL 5 MG PO TABS: 5.0000 mg | ORAL_TABLET | Freq: Three times a day (TID) | ORAL | 0 refills | Status: AC | PRN

## 2022-02-10 MED ORDER — OXYCODONE HCL 5 MG PO TABS
5.0000 mg | ORAL_TABLET | Freq: Once | ORAL | Status: AC
  Administered 2022-02-10: 5 mg via ORAL
  Filled 2022-02-10: qty 1

## 2022-02-10 MED ORDER — LACTATED RINGERS IV BOLUS: 1000.0000 mL | Freq: Once | INTRAVENOUS | Status: DC

## 2022-02-10 MED ORDER — ACETAMINOPHEN 500 MG PO TABS
1000.0000 mg | ORAL_TABLET | Freq: Once | ORAL | Status: AC
  Administered 2022-02-10: 1000 mg via ORAL
  Filled 2022-02-10: qty 2

## 2022-02-10 NOTE — ED Notes (Signed)
This RN went to check on pt, and she is currently in imaging ?

## 2022-02-10 NOTE — ED Triage Notes (Signed)
Pt brought from home via EMS for multiple falls today. Reports hitting her head but denies having any pain at this time. ?

## 2022-02-10 NOTE — Discharge Instructions (Addendum)
Use Tylenol for pain and fevers.  Up to 1000 mg per dose, up to 4 times per day.  Do not take more than 4000 mg of Tylenol/acetaminophen within 24 hours.. ? ?Please use the oxycodone for more severe/breakthrough pain. ?

## 2022-02-10 NOTE — ED Provider Notes (Signed)
? ?[facility] ?Provider Note ? ? ? Event Date/Time  ? First MD Initiated Contact with Patient 02/10/22 2213   ?  (approximate) ? ? ?History  ? ?Fall and Altered Mental Status ? ? ?HPI ? ?Katherine Wood is a 81 y.o. female who presents to the ED for evaluation of Fall and Altered Mental Status ?  ?I reviewed medical DC summary from 3/4.  Home hospice DNR patient.  Dementia, partial colectomy with a colostomy, recurrent UTIs.  Was admitted then for high ostomy output/diarrhea.  C. difficile negative.  ?I reviewed palliative note from 3/20. ? ?Patient presents with recurrent falls in the past 24 hours.  As documented below, patient is demented at baseline and on home hospice.  She had 4 falls yesterday when being cared for at her sister's house who does not typically care for the patient, had a witnessed fall today with the family.  Patient was unable to get back up today so she was brought to the ED for x-rays. ? ?Physical Exam  ? ?Triage Vital Signs: ?ED Triage Vitals  ?Enc Vitals Group  ?   BP 02/10/22 2200 102/71  ?   Pulse Rate 02/10/22 2200 (!) 122  ?   Resp 02/10/22 2200 (!) 22  ?   Temp 02/10/22 2203 99.3 ?F (37.4 ?C)  ?   Temp Source 02/10/22 2203 Axillary  ?   SpO2 02/10/22 2200 96 %  ?   Weight --   ?   Height --   ?   Head Circumference --   ?   Peak Flow --   ?   Pain Score --   ?   Pain Loc --   ?   Pain Edu? --   ?   Excl. in GC? --   ? ? ?Most recent vital signs: ?Vitals:  ? 02/10/22 2200 02/10/22 2203  ?BP: 102/71   ?Pulse: (!) 122   ?Resp: (!) 22   ?Temp:  99.3 ?F (37.4 ?C)  ?SpO2: 96%   ? ? ?General: Awake, no distress.  Comfortable.  Pleasantly disoriented ?CV:  Good peripheral perfusion.  ?Resp:  Normal effort.  ?Abd:  No distention.  ?MSK:  Small flat bruising to the right knee anteriorly without significant effusion.  No deformity or shortening to the left leg.  Seems tender with logrolling of the left leg, just grimacing. ?Neuro:  No focal deficits  appreciated. ?Other:   ? ? ?ED Results / Procedures / Treatments  ? ?Labs ?(all labs ordered are listed, but only abnormal results are displayed) ?Labs Reviewed - No data to display ? ? ?EKG ?Sinus tachycardia the rate of 125 bpm.  Tremulous baseline.  No STEMI.  Normal axis. ? ?RADIOLOGY ?CXR reviewed by me without evidence of acute cardiopulmonary pathology. ?Plain films of bilateral knees without evidence of fracture or dislocation, reviewed by me. ?Plain film of the pelvis and left hip reviewed me with evidence of intertrochanteric fracture ? ?Official radiology report(s): ?DG Chest 1 View ? ?Result Date: 02/10/2022 ?CLINICAL DATA:  Multiple falls. EXAM: CHEST  1 VIEW COMPARISON:  January 19, 2016 FINDINGS: The lungs are hyperinflated with mild, diffuse, chronic appearing increased interstitial lung markings. There is no evidence of acute infiltrate, pleural effusion or pneumothorax. The heart size and mediastinal contours are within normal limits. The visualized skeletal structures are unremarkable. IMPRESSION: No active cardiopulmonary disease. Electronically Signed   By: Aram Candela M.D.   On: 02/10/2022 23:32  ? ?DG Knee  Complete 4 Views Left ? ?Result Date: 02/10/2022 ?CLINICAL DATA:  Status post multiple falls EXAM: LEFT KNEE - COMPLETE 4+ VIEW COMPARISON:  None. FINDINGS: No evidence of acute fracture or dislocation. No evidence of arthropathy or other focal bone abnormality. A small joint effusion is noted. There is mild vascular calcification. IMPRESSION: 1. No acute osseous abnormality. 2. Small joint effusion. Electronically Signed   By: Aram Candela M.D.   On: 02/10/2022 23:29  ? ?DG Knee Complete 4 Views Right ? ?Result Date: 02/10/2022 ?CLINICAL DATA:  Fall EXAM: RIGHT KNEE - COMPLETE 4+ VIEW COMPARISON:  04/13/2011 FINDINGS: No evidence of fracture, dislocation, or joint effusion. No evidence of arthropathy or other focal bone abnormality. Soft tissues are unremarkable. IMPRESSION: Negative.  Electronically Signed   By: Deatra Robinson M.D.   On: 02/10/2022 23:37  ? ?DG HIP UNILAT WITH PELVIS 2-3 VIEWS LEFT ? ?Result Date: 02/10/2022 ?CLINICAL DATA:  Multiple falls. EXAM: DG HIP (WITH OR WITHOUT PELVIS) 2-3V LEFT COMPARISON:  None. FINDINGS: An acute fracture deformity is seen extending through the neck of the proximal left femur. Approximately 1/2 shaft width superolateral displacement of the distal fracture site is seen. There is no evidence of dislocation. Soft tissue structures are unremarkable. IMPRESSION: Acute fracture of the proximal left femur. Electronically Signed   By: Aram Candela M.D.   On: 02/10/2022 23:30   ? ?PROCEDURES and INTERVENTIONS: ? ?.1-3 Lead EKG Interpretation ?Performed by: Delton Prairie, MD ?Authorized by: Delton Prairie, MD  ? ?  Interpretation: abnormal   ?  ECG rate:  118 ?  ECG rate assessment: tachycardic   ?  Rhythm: sinus tachycardia   ?  Ectopy: none   ?  Conduction: normal   ? ?Medications  ?acetaminophen (TYLENOL) tablet 1,000 mg (has no administration in time range)  ?oxyCODONE (Oxy IR/ROXICODONE) immediate release tablet 5 mg (has no administration in time range)  ? ? ? ?IMPRESSION / MDM / ASSESSMENT AND PLAN / ED COURSE  ?I reviewed the triage vital signs and the nursing notes. ? ?Bedbound hospice patient presents to the ED after multiple falls, evidence of a left hip fracture but with controlled pain and family preferring to manage nonoperatively at home with continued hospice services.  Family declines serum diagnostics and urinalysis to assess for any metabolic or infectious derangements to cause these falls.  EKG is nonischemic.  Plain films of the affected areas after her fall with evidence of a left hip fracture.  On multiple occasions I discussed plan of care with the family and offered palliative fixation but they declined indicating that it is well to go home and continue with hospice care.  Patient is already bedbound and has well-controlled pain here in  the ED.  Family seems to understand implication of this decision and are comfortable with this.  I think this is reasonable we will discharge with a few tablets of oxycodone to help with any severe and breakthrough pain.  We discussed return precautions. ? ?Clinical Course as of 02/10/22 2344  ?Wed Feb 10, 2022  ?2220 I call son, Jill Alexanders. He called 911. He's primary caregiver. She fell down and wouldn't get up. Larey Seat 4 times at sisters house yesterday. He called [facility], they checked patient. They wanted to get Xrays. I offered blood work and urine studies, they decline. Just XRays [DS]  ?2334 I call justin back.  We talked about left hip fracture.  We discussed options of consulting with orthopedics and a percutaneous pinning for fixation  and pain control.  He reports that he does not want this performed as her pain is well controlled and they do not want to do this and put her under anesthesia.  We discussed management at home with analgesia, following up with hospice and orthopedics.  We discussed return precautions if she has uncontrolled pain.  He is agreeable with this plan of care. [DS]  ?2340 Reassessed the patient and discussed with daughter-in-law, Jill AlexandersJustin is on the phone with her in the room.  We again talked through hip fracture, pain control and options.  They are both in agreement that they would not want surgery and fixation.  She is largely already bedbound at home, seems okay right now and they do not want a put her through that. [DS]  ?  ?Clinical Course User Index ?[DS] Delton PrairieSmith, Selvin Yun, MD  ? ? ? ?FINAL CLINICAL IMPRESSION(S) / ED DIAGNOSES  ? ?Final diagnoses:  ?Fall, initial encounter  ?Closed fracture of left hip, initial encounter (HCC)  ? ? ? ?Rx / DC Orders  ? ?ED Discharge Orders   ? ?      Ordered  ?  oxyCODONE (ROXICODONE) 5 MG immediate release tablet  Every 8 hours PRN       ? 02/10/22 2334  ? ?  ?  ? ?  ? ? ? ?Note:  This document was prepared using Dragon voice recognition software and  may include unintentional dictation errors. ?  ?Delton PrairieSmith, Kelijah Towry, MD ?02/10/22 2346 ? ?

## 2022-02-11 DIAGNOSIS — W19XXXA Unspecified fall, initial encounter: Secondary | ICD-10-CM | POA: Diagnosis not present

## 2022-02-11 DIAGNOSIS — I959 Hypotension, unspecified: Secondary | ICD-10-CM | POA: Diagnosis not present

## 2022-02-11 DIAGNOSIS — S79929A Unspecified injury of unspecified thigh, initial encounter: Secondary | ICD-10-CM | POA: Diagnosis not present

## 2022-02-11 DIAGNOSIS — Z7401 Bed confinement status: Secondary | ICD-10-CM | POA: Diagnosis not present

## 2022-02-11 NOTE — ED Notes (Signed)
Pt's family called about transport back home. Jill Alexanders, son, spoken to. Jill Alexanders states that they will be here at 0800 to pick her up. ?

## 2022-02-11 NOTE — ED Notes (Signed)
Daughter, Nevin Bloodgood, called.  No answer.  Voicemail left for return call in order to set up safe discharge for patient.  ?

## 2022-02-11 NOTE — ED Notes (Signed)
Called [facility] for transport to pt's home ? ?

## 2022-02-11 NOTE — ED Notes (Signed)
EMS here to take patient home. Belongings in hand. ?

## 2022-02-19 ENCOUNTER — Telehealth: Payer: Self-pay | Admitting: Gastroenterology

## 2022-02-19 NOTE — Telephone Encounter (Signed)
Patients son Jill Alexanders called to cancel her appointment and stated that his mother passed away a few days ago.  ?

## 2022-02-22 DEATH — deceased

## 2022-02-23 ENCOUNTER — Ambulatory Visit: Payer: Medicare PPO | Admitting: Gastroenterology

## 2022-03-01 ENCOUNTER — Telehealth: Payer: Medicare PPO

## 2022-03-16 ENCOUNTER — Encounter: Payer: Self-pay | Admitting: Nurse Practitioner
